# Patient Record
Sex: Female | Born: 1965
Health system: Southern US, Community
[De-identification: ages and names within clinical notes are randomized; demographics above are authoritative.]

## PROBLEM LIST (undated history)

## (undated) ENCOUNTER — Emergency Department (HOSPITAL_BASED_OUTPATIENT_CLINIC_OR_DEPARTMENT_OTHER): Admission: EM | Payer: 59

## (undated) DIAGNOSIS — I951 Orthostatic hypotension: Secondary | ICD-10-CM

## (undated) DIAGNOSIS — N390 Urinary tract infection, site not specified: Secondary | ICD-10-CM

## (undated) DIAGNOSIS — G90A Postural orthostatic tachycardia syndrome (POTS): Secondary | ICD-10-CM

## (undated) DIAGNOSIS — R000 Tachycardia, unspecified: Secondary | ICD-10-CM

## (undated) DIAGNOSIS — B019 Varicella without complication: Secondary | ICD-10-CM

## (undated) DIAGNOSIS — N926 Irregular menstruation, unspecified: Secondary | ICD-10-CM

## (undated) DIAGNOSIS — N12 Tubulo-interstitial nephritis, not specified as acute or chronic: Secondary | ICD-10-CM

## (undated) DIAGNOSIS — N83209 Unspecified ovarian cyst, unspecified side: Secondary | ICD-10-CM

## (undated) DIAGNOSIS — K219 Gastro-esophageal reflux disease without esophagitis: Secondary | ICD-10-CM

## (undated) DIAGNOSIS — M1990 Unspecified osteoarthritis, unspecified site: Secondary | ICD-10-CM

## (undated) DIAGNOSIS — R51 Headache: Secondary | ICD-10-CM

## (undated) DIAGNOSIS — Z889 Allergy status to unspecified drugs, medicaments and biological substances status: Secondary | ICD-10-CM

## (undated) DIAGNOSIS — D649 Anemia, unspecified: Secondary | ICD-10-CM

## (undated) DIAGNOSIS — C4491 Basal cell carcinoma of skin, unspecified: Secondary | ICD-10-CM

## (undated) DIAGNOSIS — I498 Other specified cardiac arrhythmias: Secondary | ICD-10-CM

## (undated) DIAGNOSIS — E079 Disorder of thyroid, unspecified: Secondary | ICD-10-CM

## (undated) HISTORY — PX: CERVICAL CONIZATION W/BX: SHX1330

## (undated) HISTORY — DX: Other specified cardiac arrhythmias: I49.8

## (undated) HISTORY — DX: Anemia, unspecified: D64.9

## (undated) HISTORY — DX: Disorder of thyroid, unspecified: E07.9

## (undated) HISTORY — DX: Basal cell carcinoma of skin, unspecified: C44.91

## (undated) HISTORY — DX: Urinary tract infection, site not specified: N39.0

## (undated) HISTORY — PX: DILATION AND CURETTAGE OF UTERUS: SHX78

## (undated) HISTORY — DX: Unspecified osteoarthritis, unspecified site: M19.90

## (undated) HISTORY — DX: Orthostatic hypotension: I95.1

## (undated) HISTORY — DX: Gastro-esophageal reflux disease without esophagitis: K21.9

## (undated) HISTORY — DX: Allergy status to unspecified drugs, medicaments and biological substances: Z88.9

## (undated) HISTORY — DX: Postural orthostatic tachycardia syndrome (POTS): G90.A

## (undated) HISTORY — DX: Irregular menstruation, unspecified: N92.6

## (undated) HISTORY — DX: Unspecified ovarian cyst, unspecified side: N83.209

## (undated) HISTORY — DX: Tubulo-interstitial nephritis, not specified as acute or chronic: N12

## (undated) HISTORY — DX: Headache: R51

## (undated) HISTORY — DX: Tachycardia, unspecified: R00.0

## (undated) HISTORY — DX: Varicella without complication: B01.9

---

## 2003-07-14 ENCOUNTER — Emergency Department (HOSPITAL_COMMUNITY): Admission: EM | Admit: 2003-07-14 | Discharge: 2003-07-15 | Payer: Self-pay | Admitting: Nurse Practitioner

## 2004-10-15 ENCOUNTER — Other Ambulatory Visit: Admission: RE | Admit: 2004-10-15 | Discharge: 2004-10-15 | Payer: Self-pay | Admitting: Obstetrics and Gynecology

## 2005-04-21 ENCOUNTER — Ambulatory Visit: Payer: Self-pay | Admitting: Cardiology

## 2008-06-19 ENCOUNTER — Ambulatory Visit: Payer: Self-pay | Admitting: Family Medicine

## 2008-06-19 DIAGNOSIS — R5383 Other fatigue: Secondary | ICD-10-CM

## 2008-06-19 DIAGNOSIS — G43109 Migraine with aura, not intractable, without status migrainosus: Secondary | ICD-10-CM | POA: Insufficient documentation

## 2008-06-19 DIAGNOSIS — R5381 Other malaise: Secondary | ICD-10-CM

## 2008-06-23 ENCOUNTER — Encounter (INDEPENDENT_AMBULATORY_CARE_PROVIDER_SITE_OTHER): Payer: Self-pay | Admitting: *Deleted

## 2008-06-23 LAB — CONVERTED CEMR LAB
ALT: 9 units/L (ref 0–35)
AST: 16 units/L (ref 0–37)
Alkaline Phosphatase: 41 units/L (ref 39–117)
CO2: 27 meq/L (ref 19–32)
Chloride: 108 meq/L (ref 96–112)
Creatinine, Ser: 0.7 mg/dL (ref 0.4–1.2)
Eosinophils Relative: 3.7 % (ref 0.0–5.0)
Folate: 10.9 ng/mL
HCT: 36.8 % (ref 36.0–46.0)
Hemoglobin: 13.1 g/dL (ref 12.0–15.0)
Monocytes Absolute: 0.3 10*3/uL (ref 0.1–1.0)
Monocytes Relative: 6.6 % (ref 3.0–12.0)
Neutro Abs: 3.3 10*3/uL (ref 1.4–7.7)
Platelets: 191 10*3/uL (ref 150–400)
Potassium: 3.9 meq/L (ref 3.5–5.1)
TSH: 4.05 microintl units/mL (ref 0.35–5.50)
Total Bilirubin: 0.8 mg/dL (ref 0.3–1.2)
WBC: 5.3 10*3/uL (ref 4.5–10.5)

## 2008-08-07 ENCOUNTER — Ambulatory Visit: Payer: Self-pay | Admitting: Family Medicine

## 2008-11-07 ENCOUNTER — Ambulatory Visit: Payer: Self-pay | Admitting: Family Medicine

## 2008-11-07 DIAGNOSIS — J069 Acute upper respiratory infection, unspecified: Secondary | ICD-10-CM | POA: Insufficient documentation

## 2008-12-29 ENCOUNTER — Ambulatory Visit: Payer: Self-pay | Admitting: Family Medicine

## 2008-12-29 DIAGNOSIS — J019 Acute sinusitis, unspecified: Secondary | ICD-10-CM

## 2008-12-29 DIAGNOSIS — J309 Allergic rhinitis, unspecified: Secondary | ICD-10-CM | POA: Insufficient documentation

## 2009-12-11 ENCOUNTER — Ambulatory Visit: Payer: Self-pay | Admitting: Family Medicine

## 2010-05-18 NOTE — Assessment & Plan Note (Signed)
Summary: ?SINUS INFECTION/CLE   Vital Signs:  Patient profile:   45 year old female Height:      63 inches Weight:      134.75 pounds BMI:     23.96 Temp:     98 degrees F oral Pulse rate:   84 / minute Pulse rhythm:   regular BP sitting:   106 / 74  (left arm) Cuff size:   regular  Vitals Entered By: Lewanda Rife LPN (December 11, 2009 12:15 PM) CC: ?sinus infection head congested, sorethroat with swollen glands, sinus drainage   History of Present Illness: strep test neg today  has had symptoms since july 4th -- thought it was a cold  got better and then worse  took alka selzer cold coughing up green d/c  LN feel swollen face -- congested / pain  very congested nose  tried theraflu and mucinex and zyrtec fever on and off   no ear pain throat hurts   no n/v/d     Allergies: 1)  ! * Oc  Past History:  Past Surgical History: Last updated: June 29, 2008 cervical conization 87,88 D and C   Family History: Last updated: 29-Jun-2008 biological father died of cancer (? 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 Eyes:  Denies blurring and eye irritation. ENT:  Complains of nasal congestion, postnasal drainage, sinus pressure, and sore throat. CV:  Denies chest pain or discomfort and palpitations. Resp:  Complains of cough; denies sputum productive. GI:  Denies diarrhea,  nausea, and vomiting. Derm:  Denies rash.  Physical Exam  General:  Well-developed,well-nourished,in no acute distress; alert,appropriate and cooperative throughout examination Head:  normocephalic, atraumatic, and no abnormalities observed.  tender over frontal and maxillary sinuses bilat  Eyes:  vision grossly intact, pupils equal, pupils round, and pupils reactive to light.  no conjunctival pallor, injection or icterus  Ears:  R ear normal and L ear normal.   Nose:  nares are injected and congested bilaterally  Mouth:  pharynx pink and moist, no erythema, and no exudates.   Neck:  supple with full rom and no masses or thyromegally, no JVD or carotid bruit  Lungs:  Normal respiratory effort, chest expands symmetrically. Lungs are clear to auscultation, no crackles or wheezes. Heart:  Normal rate and regular rhythm. S1 and S2 normal without gallop, murmur, click, rub or other extra sounds. Skin:  Intact without suspicious lesions or rashes Cervical Nodes:  No lymphadenopathy noted Psych:  normal affect, talkative and pleasant    Impression & Recommendations:  Problem # 1:  SINUSITIS - ACUTE-NOS (ICD-461.9) Assessment New  with sinus pain and fever s/p uri will tx with augmentin and update  pt advised to update me if symptoms worsen or do not improve - esp if sinus pain or worse cough  also re start flonase for all season Her updated medication list for  this problem includes:    Fluticasone Propionate 50 Mcg/act Susp (Fluticasone propionate) .Marland Kitchen... 2 sprays each nostril once daily    Augmentin 875-125 Mg Tabs (Amoxicillin-pot clavulanate) .Marland Kitchen... 1 by mouth two times a day for 10 days  Orders: Prescription Created Electronically [phone]) Rapid Strep (60454)  Complete Medication List: 1)  Imitrex 50 Mg Tabs (Sumatriptan succinate) .Marland Kitchen.. 1 by mouth times one as needed migraine (max one pill in a day) 2)  Vitamin B Complex-c Caps (B complex-c) .... Take 1 tablet by mouth once a day 3)   Multivitamins Tabs (Multiple vitamin) .... Take 1 tablet by mouth once a day 4)  Fluticasone Propionate 50 Mcg/act Susp (Fluticasone propionate) .... 2 sprays each nostril once daily 5)  Ginkoba 40 Mg Tabs (Ginkgo biloba) .... Take 1 tablet by mouth once a day 6)  Melatonin 5 Mg Tabs (Melatonin) .... Take 1 tablet by mouth once a day at bedtime 7)  Tylenol Extra Strength 500 Mg Tabs (Acetaminophen) .... Otc as directed. 8)  Augmentin 875-125 Mg Tabs (Amoxicillin-pot clavulanate) .Marland Kitchen.. 1 by mouth two times a day for 10 days  Patient Instructions: 1)  you can try mucinex over the counter twice daily as directed and nasal saline spray for congestion 2)  tylenol over the counter as directed may help with aches, headache and fever 3)  call if symptoms worsen or if not improved in 4-5 days  4)  take the augmentin as directed 5)  get back on flonase during allergy season  Prescriptions: FLUTICASONE PROPIONATE 50 MCG/ACT  SUSP (FLUTICASONE PROPIONATE) 2 sprays each nostril once daily  #13mdi x 11   Entered and Authorized by:   Judith Part MD   Signed by:   Judith Part MD on 12/11/2009   Method used:   [facility] to        [id number]       [address]       [place], [place]  [address]       Ph: [phone] or [phone]       Fax: [phone]   RxID:   [id number] AUGMENTIN 875-125 MG TABS (AMOXICILLIN-POT CLAVULANATE) 1 by mouth two times a day for 10 days  #20 x 0   Entered and Authorized by:   Judith Part MD   Signed by:   Judith Part MD on 12/11/2009   Method used:   [facility]        [id number]       [address]       [place], [place]  [address]       Ph: [phone] or [phone]       Fax: [phone]   RxID:   [id number]   Current Allergies (reviewed today): ! * OC  Laboratory Results  Date/Time Received: December 11, 2009 12:17 PM  Date/Time Reported:  December 11, 2009 12:17 PM   Other Tests  Rapid Strep: negative  Kit Test Internal QC: Positive   (Normal Range: Negative)    Past Medical History:    Chicken Pox- past     Anemia- in past     Frequent Headaches    Migraines    Allergies    Hx of Uti's    ovarian cysts - come and go     irregular menses     basal cell- tx  with Moh's proceedure             [facility]    gyn - Dr Henderson Cloud

## 2010-09-02 ENCOUNTER — Encounter: Payer: Self-pay | Admitting: Family Medicine

## 2010-09-03 ENCOUNTER — Ambulatory Visit (INDEPENDENT_AMBULATORY_CARE_PROVIDER_SITE_OTHER): Payer: 59 | Admitting: Family Medicine

## 2010-09-03 ENCOUNTER — Encounter: Payer: Self-pay | Admitting: Family Medicine

## 2010-09-03 DIAGNOSIS — R5383 Other fatigue: Secondary | ICD-10-CM | POA: Insufficient documentation

## 2010-09-03 DIAGNOSIS — F341 Dysthymic disorder: Secondary | ICD-10-CM

## 2010-09-03 DIAGNOSIS — G43109 Migraine with aura, not intractable, without status migrainosus: Secondary | ICD-10-CM

## 2010-09-03 LAB — COMPREHENSIVE METABOLIC PANEL
AST: 14 U/L (ref 0–37)
Albumin: 3.6 g/dL (ref 3.5–5.2)
BUN: 18 mg/dL (ref 6–23)
Calcium: 9.1 mg/dL (ref 8.4–10.5)
Chloride: 108 mEq/L (ref 96–112)
Glucose, Bld: 79 mg/dL (ref 70–99)
Potassium: 4.7 mEq/L (ref 3.5–5.1)
Total Protein: 6.7 g/dL (ref 6.0–8.3)

## 2010-09-03 LAB — CBC WITH DIFFERENTIAL/PLATELET
Basophils Relative: 0.5 % (ref 0.0–3.0)
Eosinophils Relative: 3.5 % (ref 0.0–5.0)
Lymphocytes Relative: 23.5 % (ref 12.0–46.0)
Neutrophils Relative %: 67.5 % (ref 43.0–77.0)
RBC: 4.09 Mil/uL (ref 3.87–5.11)
WBC: 6.5 10*3/uL (ref 4.5–10.5)

## 2010-09-03 LAB — TSH: TSH: 4.28 u[IU]/mL (ref 0.35–5.50)

## 2010-09-03 MED ORDER — AMITRIPTYLINE HCL 10 MG PO TABS: ORAL_TABLET | ORAL | Status: DC

## 2010-09-03 MED ORDER — SUMATRIPTAN SUCCINATE 50 MG PO TABS: 50.0000 mg | ORAL_TABLET | Freq: Every day | ORAL | Status: DC | PRN

## 2010-09-03 NOTE — Progress Notes (Signed)
Subjective:    Patient ID: Kara Cooper, female    DOB: 12-09-1965, 45 y.o.   MRN: [id number]  HPI  Here for acute visit - headache and hoarseness Has a hx of migraines  imitrex - takes if she catches it early   In past situational stress and saw a therapist with family  Dx with skin cancer moh's procedure - on face - that was stressful   Headaches are worse again  ? If migraine  Always starts on her L side of face - maxillary , then gets the sensation of throat closing up  In bed for several days then  Much more stress with life/ work/ school  Very fatigued  Worried about thyroid  Is susceptible to dehydration  Makes herself work out  [employer] up tired   Is on OC loestrin to control bleeding from fibroid  Has been on it for 4 mo  3 of the 4 were ok   This month is the problem  Getting headaches premenstrual and about every 2 weeks   More down emotionally now - last year related to school  Lots more stress this year Daughter has DM1 and other daughter with financial problems  Not dealing as well with stress  Husband has noticed changes  Complains more   Wt is up 5 lb   Past Medical History  Diagnosis Date  . Chicken pox     in past  . Anemia     in past  . Headache     frequent  . Migraine   . History of seasonal allergies   . UTI (lower urinary tract infection)     history of  . Other and unspecified ovarian cysts     come and go  . Irregular menses   . Basal cell carcinoma     treated with Moh's procedure    History   Social History  . Marital Status: Married    Spouse Name: N/A    Number of Children: 2  . Years of Education: N/A   Occupational History  . RN    Social History Main Topics  . Smoking status: Never Smoker   . Smokeless tobacco: Not on file  . Alcohol Use: Yes     Occasional  . Drug Use: Not on file  . Sexually Active: Not on file   Other Topics Concern  . Not on file   Social History Narrative   Student.  Exercises every  day, also cycles.    Past Surgical History  Procedure Date  . Cervical conization w/bx 1987, 1988  . Dilation and curettage of uterus    No Known Allergies    Review of Systems Review of Systems  Constitutional: Negative for fever, appetite change, fatigue and unexpected weight change.  Eyes: Negative for pain and visual disturbance.  Respiratory: Negative for cough and shortness of breath.   Cardiovascular: Negative.for cp or edema or sob   Gastrointestinal: Negative for nausea, diarrhea and constipation.  Genitourinary: Negative for urgency and frequency.  Skin: Negative for pallor.  Neurological: Negative for weakness, light-headedness, numbness and pos for headaches  Hematological: Negative for adenopathy. Does not bruise/bleed easily.  Psychiatric/Behavioral: poss for dysphoric mooddThe patient is not nervous/anxious.  no SI        Objective:   Physical Exam  Constitutional: She appears well-developed and well-nourished. No distress.  HENT:  Head: Normocephalic and atraumatic.  Right Ear: External ear normal.  Left Ear: External ear normal.  Nose:  Nose normal.  Mouth/Throat: Oropharynx is clear and moist.       No sinus tenderness   Eyes: Conjunctivae and EOM are normal. Pupils are equal, round, and reactive to light.       Fundi grossly wnl   Neck: Normal range of motion. Neck supple. No JVD present. Carotid bruit is not present. No tracheal deviation present. No thyromegaly present.  Cardiovascular: Normal rate, regular rhythm and normal heart sounds.   Pulmonary/Chest: Effort normal and breath sounds normal. No respiratory distress. She has no wheezes.  Abdominal: Soft. Bowel sounds are normal. She exhibits no distension. There is no tenderness.  Musculoskeletal: She exhibits edema. She exhibits no tenderness.  Lymphadenopathy:    She has no cervical adenopathy.  Neurological: She is alert. She has normal strength and normal reflexes. No cranial nerve deficit or  sensory deficit. She displays a negative Romberg sign. Coordination and gait normal.       No TA tenderness  Skin: Skin is warm and dry. No rash noted. No erythema. No pallor.  Psychiatric: She has a normal mood and affect. Her behavior is normal.       Seems fatigued  Good eye contact and comm skills          Assessment & Plan:

## 2010-09-03 NOTE — Assessment & Plan Note (Signed)
Suspect multifactorial - (see disc of migraine) Lab today Trial of elavil for better sleep F/u 2-4 weeks

## 2010-09-03 NOTE — Patient Instructions (Signed)
Elavil (amitriptyline) - start with 1 pill at bedtime for a week If not too sedated - go up to 2 pills at bedtime  Get regular sleep Avoid caffiene Avoid dehydration Labs today Follow up in 2-4 weeks

## 2010-09-03 NOTE — Assessment & Plan Note (Signed)
In addn to sleep problems/ migraine and stress Disc coping skills- overall good  Trial of low dose elavil for this as well as ha and sleep F/u 2-4 wk Disc poss side eff Update asap if worse or any SI

## 2010-09-03 NOTE — Assessment & Plan Note (Signed)
Multifactorial worsening migraines Lack of sleep, inc in stress, dehydration, hormones may all play a role Pt will ask gyn about poss hormone suppl during the cycle Disc sleep- trial of elavil for this and mood and migraine prophylaxis Disc red flags/ poss side eff  F/u 2-4 wk

## 2010-09-08 ENCOUNTER — Ambulatory Visit: Payer: Self-pay | Admitting: Family Medicine

## 2010-10-01 ENCOUNTER — Ambulatory Visit (INDEPENDENT_AMBULATORY_CARE_PROVIDER_SITE_OTHER): Payer: 59 | Admitting: Family Medicine

## 2010-10-01 ENCOUNTER — Encounter: Payer: Self-pay | Admitting: Family Medicine

## 2010-10-01 VITALS — BP 110/68 | HR 76 | Temp 98.2°F | Ht 63.0 in | Wt 138.8 lb

## 2010-10-01 DIAGNOSIS — F341 Dysthymic disorder: Secondary | ICD-10-CM

## 2010-10-01 DIAGNOSIS — G43109 Migraine with aura, not intractable, without status migrainosus: Secondary | ICD-10-CM

## 2010-10-01 MED ORDER — AMITRIPTYLINE HCL 10 MG PO TABS: 10.0000 mg | ORAL_TABLET | Freq: Every day | ORAL | Status: DC

## 2010-10-01 NOTE — Progress Notes (Signed)
Subjective:    Patient ID: Kara Cooper, female    DOB: 02/05/1966, 45 y.o.   MRN: [id number]  HPI Put on elavil for headache and also dysthymia Did not tolerate 20 - too sedated  On 10 mg nightly And sleeping much better  Dry mouth is side effect- drinking lots of water and avoiding caff  Has just had one headache since last visit - mid cycle   On loestrin fe   Mood is much better - with better sleep Is back to waking up refreshed again   Patient Active Problem List  Diagnoses  . MIGRAINE, CLASSICAL  . ALLERGIC RHINITIS  . Fatigue  . Dysthymia   Past Medical History  Diagnosis Date  . Chicken pox     in past  . Anemia     in past  . Headache     frequent  . Migraine   . History of seasonal allergies   . UTI (lower urinary tract infection)     history of  . Other and unspecified ovarian cysts     come and go  . Irregular menses   . Basal cell carcinoma     treated with Moh's procedure   Past Surgical History  Procedure Date  . Cervical conization w/bx 1987, 1988  . Dilation and curettage of uterus    History  Substance Use Topics  . Smoking status: Never Smoker   . Smokeless tobacco: Not on file  . Alcohol Use: Yes     Occasional   Family History  Problem Relation Age of Onset  . Hyperlipidemia Mother   . Heart disease Mother     A-fib, ablation  . Migraines Daughter   . Diabetes Daughter     type I  . Depression Daughter     bipolar   No Known Allergies Current Outpatient Prescriptions on File Prior to Visit  Medication Sig Dispense Refill  . B Complex-C (B-COMPLEX WITH VITAMIN C) tablet Take 1 tablet by mouth daily.        . Multiple Vitamin (MULTIVITAMIN) tablet Take 1 tablet by mouth daily.        . Norethin Ace-Eth Estrad-FE (LOESTRIN FE 1/20 PO) Take 1 tablet by mouth daily.       . SUMAtriptan (IMITREX) 50 MG tablet Take 1 tablet (50 mg total) by mouth daily as needed for migraine. Take one by mouth times one as needed for migraine- max  one pill in a day.  10 tablet  11  . acetaminophen (TYLENOL) 500 MG tablet Take OTC as directed       . fluticasone (FLONASE) 50 MCG/ACT nasal spray Place 2 sprays into the nose daily as needed.       . Ginkgo Biloba (GINKOBA) 40 MG TABS Take by mouth daily.        . Melatonin 5 MG TABS Take one tablet by mouth once a day at bedtime            Review of Systems Review of Systems  Constitutional: Negative for fever, appetite change, fatigue and unexpected weight change.  Eyes: Negative for pain and visual disturbance.  Respiratory: Negative for cough and shortness of breath.   Cardiovascular: Negative.  for cp or palp  Gastrointestinal: Negative for nausea, diarrhea and constipation.  Genitourinary: Negative for urgency and frequency.  Skin: Negative for pallor.  Neurological: Negative for weakness, light-headedness, numbness , headaches are improved  Hematological: Negative for adenopathy. Does not bruise/bleed easily.  Psychiatric/Behavioral: Negative for  dysphoric mood. The patient is not nervous/anxious.          Objective:   Physical Exam  Constitutional: She appears well-developed and well-nourished. No distress.  HENT:  Head: Normocephalic and atraumatic.  Right Ear: External ear normal.  Left Ear: External ear normal.  Mouth/Throat: Oropharynx is clear and moist.       No sinus tenderness  Eyes: Conjunctivae and EOM are normal. Pupils are equal, round, and reactive to light.  Neck: No JVD present. Erythema present. No thyromegaly present.  Cardiovascular: Normal rate, regular rhythm and normal heart sounds.   Pulmonary/Chest: Effort normal and breath sounds normal.  Musculoskeletal: She exhibits no edema.  Lymphadenopathy:    She has no cervical adenopathy.  Neurological: She is alert. She has normal strength and normal reflexes. No cranial nerve deficit or sensory deficit. Coordination and gait normal.  Skin: Skin is warm and dry. No rash noted. No erythema. No  pallor.  Psychiatric: She has a normal mood and affect.          Assessment & Plan:

## 2010-10-01 NOTE — Assessment & Plan Note (Signed)
Much imp on elavil Also better moods and sleep Will speak to gyn about OC as well

## 2010-10-01 NOTE — Patient Instructions (Signed)
I'm glad you are doing better Keep up the good work with lifestyle change Continue elavil at same dose  Keep avoiding caffiene

## 2010-10-03 NOTE — Assessment & Plan Note (Signed)
Improved to resolved with better sleep and elavil

## 2010-11-17 ENCOUNTER — Ambulatory Visit (INDEPENDENT_AMBULATORY_CARE_PROVIDER_SITE_OTHER): Payer: 59 | Admitting: Family Medicine

## 2010-11-17 ENCOUNTER — Encounter: Payer: Self-pay | Admitting: Family Medicine

## 2010-11-17 VITALS — BP 110/68 | HR 68 | Temp 98.3°F | Ht 62.25 in | Wt 138.5 lb

## 2010-11-17 DIAGNOSIS — R490 Dysphonia: Secondary | ICD-10-CM

## 2010-11-17 DIAGNOSIS — Z0000 Encounter for general adult medical examination without abnormal findings: Secondary | ICD-10-CM

## 2010-11-17 LAB — LIPID PANEL
Cholesterol: 136 mg/dL (ref 0–200)
LDL Cholesterol: 74 mg/dL (ref 0–99)

## 2010-11-17 MED ORDER — RANITIDINE HCL 150 MG PO TABS: 150.0000 mg | ORAL_TABLET | Freq: Two times a day (BID) | ORAL | Status: DC

## 2010-11-17 NOTE — Assessment & Plan Note (Signed)
Reviewed health habits including diet and exercise and skin cancer prevention Also reviewed health mt list, fam hx and immunizations  Rev labs from May  Lipids today

## 2010-11-17 NOTE — Progress Notes (Signed)
Subjective:    Patient ID: Kara Cooper, female    DOB: December 11, 1965, 45 y.o.   MRN: [id number]  HPI Here for wellness exam and to review chronic med list  Gyn exam- 1 year ago  Pap-- nl 1 year ago  Mammogram- 1 year ago was normal   Derm exam-- that is due - got a letter in the mail - was seeing Dr Danella Deis , plans to switch to Dr Yetta Barre  She wants to make her own appt   Wt stable bmi of 25  Nl labs in may but has not had chol checked   Headaches were doing the same Is ok with 10 of amitriptyline  2 more breakthrough headaches -- and also peroids  Has not talked to gyn yet about OC  Is not interested in headache clinic yet   Headaches are a little less intense About twice per month  Is sleeping better   Td status - was within 10 years -- about 4 years ago thru [facility]   Has been hoarse for months  Thinks she may have acid reflux  Wakes up at night - choking at times  No heartburn  Sometimes hurts to swallow certain foods  Does not sing anymore   Patient Active Problem List  Diagnoses  . MIGRAINE, CLASSICAL  . ALLERGIC RHINITIS  . Fatigue  . Dysthymia  . Routine general medical examination at a health care facility  . Hoarseness   Past Medical History  Diagnosis Date  . Chicken pox     in past  . Anemia     in past  . Headache     frequent  . Migraine   . History of seasonal allergies   . UTI (lower urinary tract infection)     history of  . Other and unspecified ovarian cysts     come and go  . Irregular menses   . Basal cell carcinoma     treated with Moh's procedure   Past Surgical History  Procedure Date  . Cervical conization w/bx 1987, 1988  . Dilation and curettage of uterus    History  Substance Use Topics  . Smoking status: Never Smoker   . Smokeless tobacco: Not on file  . Alcohol Use: Yes     Occasional   Family History  Problem Relation Age of Onset  . Hyperlipidemia Mother   . Heart disease Mother     A-fib, ablation  . Migraines  Daughter   . Diabetes Daughter     type I  . Depression Daughter     bipolar   No Known Allergies Current Outpatient Prescriptions on File Prior to Visit  Medication Sig Dispense Refill  . acetaminophen (TYLENOL) 500 MG tablet Take OTC as directed       . amitriptyline (ELAVIL) 10 MG tablet Take 1 tablet (10 mg total) by mouth at bedtime.  90 tablet  3  . B Complex-C (B-COMPLEX WITH VITAMIN C) tablet Take 1 tablet by mouth daily.        . Multiple Vitamin (MULTIVITAMIN) tablet Take 1 tablet by mouth daily.        . SUMAtriptan (IMITREX) 50 MG tablet Take 1 tablet (50 mg total) by mouth daily as needed for migraine. Take one by mouth times one as needed for migraine- max one pill in a day.  10 tablet  11  . fluticasone (FLONASE) 50 MCG/ACT nasal spray Place 2 sprays into the nose daily as needed.       Marland Kitchen  Ginkgo Biloba (GINKOBA) 40 MG TABS Take by mouth daily.        . Melatonin 5 MG TABS Take one tablet by mouth once a day at bedtime       . Norethin Ace-Eth Estrad-FE (LOESTRIN FE 1/20 PO) Take 1 tablet by mouth daily.             Review of Systems Review of Systems  Constitutional: Negative for fever, appetite change, fatigue and unexpected weight change.  Eyes: Negative for pain and visual disturbance.  Respiratory: Negative for cough and shortness of breath.   Cardiovascular: Negative.  for cp or palpitations Gastrointestinal: Negative for nausea, diarrhea and constipation. pos for clearing throat/ trouble swallowing  Genitourinary: Negative for urgency and frequency.  Skin: Negative for pallor. or rash  Neurological: Negative for weakness, light-headedness, numbness and headaches.  Hematological: Negative for adenopathy. Does not bruise/bleed easily.  Psychiatric/Behavioral: Negative for dysphoric mood. The patient is not nervous/anxious.         Objective:   Physical Exam  Constitutional: She appears well-developed and well-nourished. No distress.  HENT:  Head:  Normocephalic and atraumatic.  Right Ear: External ear normal.  Left Ear: External ear normal.  Nose: Nose normal.  Mouth/Throat: Oropharynx is clear and moist.  Eyes: Conjunctivae and EOM are normal. Pupils are equal, round, and reactive to light.  Neck: Normal range of motion. Neck supple. No JVD present. No thyromegaly present.  Cardiovascular: Normal rate, regular rhythm, normal heart sounds and intact distal pulses.   Pulmonary/Chest: Effort normal and breath sounds normal. No respiratory distress. She has no wheezes.  Abdominal: Soft. Bowel sounds are normal. She exhibits no distension and no mass. There is no tenderness.  Musculoskeletal: Normal range of motion. She exhibits no edema and no tenderness.  Lymphadenopathy:    She has no cervical adenopathy.  Neurological: She is alert. She has normal reflexes. Coordination normal.  Skin: Skin is warm and dry. No rash noted. No erythema. No pallor.  Psychiatric: She has a normal mood and affect.          Assessment & Plan:

## 2010-11-17 NOTE — Patient Instructions (Signed)
Take zantac 150 mg twice daily for acid reflux I think this should help the hoarseness and swallowing issues  If not improving let me know  Follow up in 2 months  Lab for cholesterol today Follow up with your gyn as planned  Follow up with dermatology as planned

## 2010-11-17 NOTE — Assessment & Plan Note (Signed)
With throat clearing and some night time symptoms ? Poss GERD Will try on zantac bid  Update if not imp F/u 2 months

## 2010-12-22 ENCOUNTER — Other Ambulatory Visit: Payer: Self-pay

## 2010-12-22 MED ORDER — FLUTICASONE PROPIONATE 50 MCG/ACT NA SUSP: 2.0000 | Freq: Every day | NASAL | Status: DC | PRN

## 2010-12-22 NOTE — Telephone Encounter (Signed)
[facility] [address] faxed refill for Fluticasone prop 50 mcg spray # 16gm.

## 2011-01-03 ENCOUNTER — Encounter: Payer: Self-pay | Admitting: Family Medicine

## 2011-01-03 ENCOUNTER — Ambulatory Visit (INDEPENDENT_AMBULATORY_CARE_PROVIDER_SITE_OTHER): Payer: 59 | Admitting: Cardiovascular Disease

## 2011-01-03 ENCOUNTER — Encounter (INDEPENDENT_AMBULATORY_CARE_PROVIDER_SITE_OTHER): Payer: 59

## 2011-01-03 ENCOUNTER — Encounter: Payer: Self-pay | Admitting: Cardiovascular Disease

## 2011-01-03 ENCOUNTER — Ambulatory Visit (INDEPENDENT_AMBULATORY_CARE_PROVIDER_SITE_OTHER): Payer: 59 | Admitting: Family Medicine

## 2011-01-03 VITALS — BP 122/84 | HR 112 | Temp 98.2°F | Wt 136.0 lb

## 2011-01-03 VITALS — BP 120/80 | HR 108 | Resp 18 | Ht 62.0 in | Wt 135.1 lb

## 2011-01-03 DIAGNOSIS — R55 Syncope and collapse: Secondary | ICD-10-CM

## 2011-01-03 DIAGNOSIS — I472 Ventricular tachycardia: Secondary | ICD-10-CM

## 2011-01-03 LAB — TROPONIN I: Troponin I: <0.3 ng/mL (ref <0.30)

## 2011-01-03 LAB — BASIC METABOLIC PANEL
Calcium: 9.6 mg/dL (ref 8.4–10.5)
GFR: 80.62 mL/min (ref >60.00)
Potassium: 3.7 mEq/L (ref 3.5–5.1)
Sodium: 138 mEq/L (ref 135–145)

## 2011-01-03 LAB — CBC WITH DIFFERENTIAL/PLATELET
Basophils Absolute: 0 10*3/uL (ref 0.0–0.1)
Basophils Relative: 0.6 % (ref 0.0–3.0)
Eosinophils Relative: 1.4 % (ref 0.0–5.0)
HCT: 38.3 % (ref 36.0–46.0)
Hemoglobin: 13.2 g/dL (ref 12.0–15.0)
Lymphocytes Relative: 19.5 % (ref 12.0–46.0)
Monocytes Relative: 5.9 % (ref 3.0–12.0)
Neutro Abs: 4.5 10*3/uL (ref 1.4–7.7)
RBC: 4.05 Mil/uL (ref 3.87–5.11)
RDW: 12.4 % (ref 11.5–14.6)
WBC: 6.2 10*3/uL (ref 4.5–10.5)

## 2011-01-03 LAB — CK TOTAL AND CKMB (NOT AT ARMC)

## 2011-01-03 LAB — TSH: TSH: 3.74 u[IU]/mL (ref 0.35–5.50)

## 2011-01-03 NOTE — Assessment & Plan Note (Signed)
The patient's orthostatic vital signs were reviewed from earlier today. She had an increase in her heart rate from 84 beats per minute supine to 112 beats per minute standing her blood pressure did not change with this. There is a noticeable increase in her heart rate with positional change on my exam. 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 Will check a 2-D echocardiogram and Holter monitor to be complete. We discussed the importance of aggressive fluid intake and salt consumption. Will try this as first-line therapy and then see the patient back in 2 weeks after her studies are completed. If she fails conservative treatment with salt and fluids, will consider initiation of fludrocortisone.

## 2011-01-03 NOTE — Assessment & Plan Note (Addendum)
Along with palpitations, chest tightness, family hx SVT, concern for arrhythmogenic syncope.  Alternatively could be orthostatic syncope although orthostatic vitals negative in office today. Expedited referral to cards for further evaluation, possible holter. EKG today - NSR 88, normal axis, intervals, no hypertrophy.  No acute ST/T changes, poor R wave progression anterior leads.  No old to compare.  Given episodes have been happening since last week, and endorses episodes in last several years as well, doubt acute coronary issue, will check stat cardiac enzymes to help rule out.  Advised of red flags to go to ER. Check blood work to eval for other causes for syncope.

## 2011-01-03 NOTE — Patient Instructions (Addendum)
There was small abnormality on EKG.  I would like to set you up with cardiology evaluation to check on rapid heart beat episodes.  Possible holter monitor. Blood work today to check on other things. Pass by Kara Cooper's office for referral to heart doctor. If chest pain returns, or any more passing out, please go to ER to be evaluated. w will call you at [phone]

## 2011-01-03 NOTE — Patient Instructions (Signed)
Your physician recommends that you schedule a follow-up appointment in: 2 weeks  Your physician has requested that you have an echocardiogram. Echocardiography is a painless test that uses sound waves to create images of your heart. It provides your doctor with information about the size and shape of your heart and how well your heart's chambers and valves are working. This procedure takes approximately one hour. There are no restrictions for this procedure.   Your physician has recommended that you wear a holter monitor. Holter monitors are medical devices that record the heart's electrical activity. Doctors most often use these monitors to diagnose arrhythmias. Arrhythmias are problems with the speed or rhythm of the heartbeat. The monitor is a small, portable device. You can wear one while you do your normal daily activities. This is usually used to diagnose what is causing palpitations/syncope (passing out).   Increase salt and fluid intake.

## 2011-01-03 NOTE — Progress Notes (Signed)
HPI:  This is a 45 year old woman presenting for initial evaluation of chest pain, palpitations, and syncope. The patient awoke 3 nights ago feeling lightheaded. She went into the bathroom to splashed cold water on her face and when she walked back to the bedroom she had a syncopal event. She fell back and hit the back of her head. Her husband was nearby and her loss of consciousness was very short. She continues to complain of symptoms of chest burning, palpitations, and lightheadedness. Her lightheadedness is much worse with standing. She denies exertional chest pain or pressure. She denies any recent illness. She has had similar episodes of palpitations and lightheadedness in the past but none of this severity. She denies dyspnea, orthopnea, or PND.  The patient was seen this morning by her primary care physician, Dr. Sharen Hones. Labs were drawn and the show a normal TSH, normal CBC and metabolic panel, and normal cardiac biomarkers.  Outpatient Encounter Prescriptions as of 01/03/2011  Medication Sig Dispense Refill  . acetaminophen (TYLENOL) 500 MG tablet Take OTC as directed       . amitriptyline (ELAVIL) 10 MG tablet Take 1 tablet (10 mg total) by mouth at bedtime.  90 tablet  3  . B Complex-C (B-COMPLEX WITH VITAMIN C) tablet Take 1 tablet by mouth daily.        . fluticasone (FLONASE) 50 MCG/ACT nasal spray Place 2 sprays into the nose daily as needed.  16 g  0  . Multiple Vitamin (MULTIVITAMIN) tablet Take 1 tablet by mouth daily.        . SUMAtriptan (IMITREX) 50 MG tablet Take 1 tablet (50 mg total) by mouth daily as needed for migraine. Take one by mouth times one as needed for migraine- max one pill in a day.  10 tablet  11  . DISCONTD: ranitidine (ZANTAC) 150 MG tablet Take 1 tablet (150 mg total) by mouth 2 (two) times daily.  60 tablet  5    Review of patient's allergies indicates no known allergies.  Past Medical History  Diagnosis Date  . Chicken pox     in past  . Anemia    in past  . Headache     frequent  . Migraine   . History of seasonal allergies   . UTI (lower urinary tract infection)     history of  . Other and unspecified ovarian cysts     come and go  . Irregular menses   . Basal cell carcinoma     treated with Moh's procedure    Past Surgical History  Procedure Date  . Cervical conization w/bx 1987, 1988  . Dilation and curettage of uterus     History   Social History  . Marital Status: Married    Spouse Name: N/A    Number of Children: 2  . Years of Education: N/A   Occupational History  . RN    Social History Main Topics  . Smoking status: Never Smoker   . Smokeless tobacco: Not on file  . Alcohol Use: Yes     Occasional  . Drug Use: Not on file  . Sexually Active: Not on file   Other Topics Concern  . Not on file   Social History Narrative   Student.  Exercises every day, also cycles.    Family History  Problem Relation Age of Onset  . Hyperlipidemia Mother   . Heart disease Mother     A-fib, ablation  . Migraines Daughter   .  Diabetes Daughter     type I  . Depression Daughter     bipolar    ROS: General: no fevers/chills/night sweats Eyes: no blurry vision, diplopia, or amaurosis ENT: no sore throat or hearing loss Resp: no cough, wheezing, or hemoptysis CV: no edema  GI: no abdominal pain, nausea, vomiting, diarrhea, or constipation GU: no dysuria, frequency, or hematuria Skin: no rash Neuro: no headache, numbness, tingling, or weakness of extremities Musculoskeletal: no joint pain or swelling Heme: no bleeding, DVT, or easy bruising Endo: no polydipsia or polyuria  BP 120/80  Pulse 108  Resp 18  Ht 5\' 2"  (1.575 m)  Wt 135 lb 1.9 oz (61.29 kg)  BMI 24.71 kg/m2  LMP 12/24/2010  PHYSICAL EXAM: Pt is alert and oriented, WD, WN, in no distress. HEENT: normal Neck: JVP normal. Carotid upstrokes normal without bruits. No thyromegaly. Lungs: equal expansion, clear bilaterally CV: Apex is  discrete and nondisplaced, RRR without murmur or gallop in the supine position. With standing the patient becomes tachycardic with a grade 2/6 early systolic murmur at the left sternal border. Abd: soft, NT, +BS, no bruit, no hepatosplenomegaly Back: no CVA tenderness Ext: no C/C/E        Femoral pulses 2+= without bruits        DP/PT pulses intact and = Skin: warm and dry without rash Neuro: CNII-XII intact             Strength intact = bilaterally  EKG:  Normal sinus rhythm 88 beats per minute, nonspecific ST abnormality, cannot exclude age-indeterminate anterior infarct.  ASSESSMENT AND PLAN:

## 2011-01-03 NOTE — Progress Notes (Signed)
  Subjective:    Patient ID: Kara Cooper, female    DOB: 1965/08/01, 45 y.o.   MRN: [id number]  HPI CC: feeling "flustered"  Pleasant 45 yo with h/o migraines and frequent headaches.  Pt is cardiac RN.  Friday morning woke up at 3am and heart was racing, went to bathroom and felt really dizzy described as lightheaded with orthostatic - washed face and then when walking back to bed passed out, hit back of head.  Doesn't remember vision going black.  Out for a few seconds.  Has not felt right since.  Went to nursing symposium on Friday.  Having chest burning sensation left chest as well as feeling lightheaded, lasted all morning.  Felt short of breath as well as nauseated with this.  No skipped beats.  Heart has been racing intermittently since episode Friday morning.  Today feeling some burning/ache in chest as well.  No HA prior.  No fever/chills, coughing, viral sxs recently, abd pain, diarrhea or constipation.  Currently being treated for acid reflux with zantac but no other new medicines.  Had this 3-4 yrs ago, episodes would happen after cycling and attributed to dehydration.  States had normal stress test back then.  Not improving this time.    Mother with h/o SVT, s/p ablation.  Over weekend felt lightheaded with position changes - orthostatic.  Grandmother with CABG at later age.  No other sudden cardiac death, early CAD.  Nonsmoker.  Lab Results  Component Value Date   LDLCALC 74 11/17/2010    Review of Systems Per HPI    Objective:   Physical Exam  Nursing note and vitals reviewed. Constitutional: She is oriented to person, place, and time. She appears well-developed and well-nourished. No distress.  HENT:  Head: Normocephalic and atraumatic.  Mouth/Throat: Oropharynx is clear and moist. No oropharyngeal exudate.  Eyes: Conjunctivae and EOM are normal. Pupils are equal, round, and reactive to light. No scleral icterus.  Neck: Normal range of motion. Neck supple. Carotid bruit  is not present. No thyromegaly present.  Cardiovascular: Normal rate, regular rhythm, normal heart sounds and intact distal pulses.   No murmur heard. Pulmonary/Chest: Effort normal and breath sounds normal. No respiratory distress. She has no wheezes. She has no rales.  Musculoskeletal: She exhibits no edema.  Lymphadenopathy:    She has no cervical adenopathy.  Neurological: She is alert and oriented to person, place, and time. She has normal strength and normal reflexes. No cranial nerve deficit or sensory deficit. She displays a negative Romberg sign. Coordination normal.  Reflex Scores:      Bicep reflexes are 2+ on the right side and 2+ on the left side.      Patellar reflexes are 2+ on the right side and 2+ on the left side.      Nl FTN, normal grip strength bilaterally, no pronator drift. No nystagmus  Skin: Skin is warm and dry. No rash noted.  Psychiatric: She has a normal mood and affect.    SL nitrox1 provided, caused HA and worsened dizziness, afterwards chest discomfort resolved some.      Assessment & Plan:

## 2011-01-10 ENCOUNTER — Ambulatory Visit (HOSPITAL_COMMUNITY): Payer: 59 | Attending: Cardiovascular Disease | Admitting: Radiology

## 2011-01-10 DIAGNOSIS — R55 Syncope and collapse: Secondary | ICD-10-CM | POA: Insufficient documentation

## 2011-01-19 ENCOUNTER — Ambulatory Visit: Payer: 59 | Admitting: Cardiovascular Disease

## 2011-05-19 ENCOUNTER — Encounter: Payer: Self-pay | Admitting: Internal Medicine

## 2011-05-19 ENCOUNTER — Ambulatory Visit (INDEPENDENT_AMBULATORY_CARE_PROVIDER_SITE_OTHER): Payer: 59 | Admitting: Internal Medicine

## 2011-05-19 VITALS — BP 100/64 | HR 108 | Ht 63.0 in | Wt 142.5 lb

## 2011-05-19 DIAGNOSIS — R000 Tachycardia, unspecified: Secondary | ICD-10-CM

## 2011-05-19 DIAGNOSIS — R55 Syncope and collapse: Secondary | ICD-10-CM

## 2011-05-19 DIAGNOSIS — R569 Unspecified convulsions: Secondary | ICD-10-CM

## 2011-05-19 NOTE — Assessment & Plan Note (Signed)
As above  there are modest changes in her baseline heart rate and blood pressure with an elevated heart rate and slight decrease in blood pressure. Also describes a variety of blood pressure changes and heart rate changes at home but these are poorly clarified. Tilt table testing may be helpful in eliminating the aspect of this, but as noted above, I am struck by what appears to be anxiety and agitation that was manifest

## 2011-05-19 NOTE — Progress Notes (Signed)
History and Physical  Patient ID: Kara Cooper MRN: [id number], SOB: Jul 28, 1965 46 y.o. Date of Encounter: 05/19/2011, 4:28 PM  Primary Physician: Roxy Manns, MD, MD Primary Cardiologist: Excell Seltzer Primary Electrophysiologist:  Graciela Husbands  Chief Complaint: spells  History of Present Illness: Kara Cooper is a 46 y.o. female seen at the request of Dr. Excell Seltzer carried a diagnosis of pots.  She initially presented in September for evaluation of chest pain, palpitations, and syncope. She awakened and felt somewhat lightheaded. She was flushed and hot. She went into the bathroom to splashed cold water on her face and when she walked back to the bedroom she had a syncopal event. She fell back and hit the back of her head. Her husband was nearby and her loss of consciousness was very short  She's had problems with exercise tolerance and go back about 4 years. She feel dry her bike about 50 miles a day and most recently has been limited to less than 10. This has been limited by tachycardia palpitations and dyspnea.  Over the last couple years she has been noted to sweat considerably more. She is orthostatic lightheadedness. Evaluation has included an ultrasound and general laboratories including thyroid which were normal  She had a spell while in the office which is characteristic of other spells so be described here. I initially was asking her about her work in the conversation was loose her speech fluid. I asked her then to tell me about her symptoms. There was an immediate change in the affect, her face reflected almost fear, there is ringing of her hands and clutching her chest with her arms in the cell phone, shortness of breath and speech limited to 3 or 4 sentences at a time accompanied by hyperventilation. This abated over 5 or 10 minutes only to recur. According to her husband these kinds of spells have happened in areas other situations.   Past Medical History  Diagnosis Date  . Chicken pox    in past  . Anemia     in past  . Headache     frequent  . Migraine   . History of seasonal allergies   . UTI (lower urinary tract infection)     history of  . Other and unspecified ovarian cysts     come and go  . Irregular menses   . Basal cell carcinoma     treated with Moh's procedure  . Postural orthostatic tachycardia syndrome     Dr. Excell Seltzer     Past Surgical History  Procedure Date  . Cervical conization w/bx 1987, 1988  . Dilation and curettage of uterus       Current Outpatient Prescriptions  Medication Sig Dispense Refill  . acetaminophen (TYLENOL) 500 MG tablet Take OTC as directed       . amitriptyline (ELAVIL) 10 MG tablet Take 1 tablet (10 mg total) by mouth at bedtime.  90 tablet  3  . B Complex-C (B-COMPLEX WITH VITAMIN C) tablet Take 1 tablet by mouth daily.        . fluticasone (FLONASE) 50 MCG/ACT nasal spray Place 2 sprays into the nose daily as needed.  16 g  0  . Multiple Vitamin (MULTIVITAMIN) tablet Take 1 tablet by mouth daily.        . SUMAtriptan (IMITREX) 50 MG tablet Take 1 tablet (50 mg total) by mouth daily as needed for migraine. Take one by mouth times one as needed for migraine- max one pill in a day.  10 tablet  11     Allergies: No Known Allergies   History  Substance Use Topics  . Smoking status: Never Smoker   . Smokeless tobacco: Not on file  . Alcohol Use: Yes     Occasional      Family History  Problem Relation Age of Onset  . Hyperlipidemia Mother   . Heart disease Mother     A-fib, ablation  . Migraines Daughter   . Diabetes Daughter     type I  . Depression Daughter     bipolar      ROS:  Please see the history of present illness.     All other systems reviewed and negative.   Vital Signs: Blood pressure 100/64, pulse 108, height 5\' 3"  (1.6 m), weight 142 lb 8 oz (64.638 kg).  PHYSICAL EXAM: General:  Well nourished, well developed female in no acute distress HEENT: normal Lymph: no adenopathy Neck: no  JVD Endocrine:  No thryomegaly Vascular: No carotid bruits; FA pulses 2+ bilaterally without bruits Cardiac:  normal S1, S2; RRR; no murmur Back: without kyphosis/scoliosis, no CVA tenderness Lungs:  clear to auscultation bilaterally, no wheezing, rhonchi or rales Abd: soft, nontender, no hepatomegaly Ext: no edema Musculoskeletal:  No deformities, BUE and BLE strength normal and equal Skin: warm and dry Neuro:  CNs 2-12 intact, no focal abnormalities noted Psych:  Abnormal as described at the end of the history of present illness   EKG:  Sinus at 86 Intervals 0.15/0.07/0.38 Axis is 84 ECG otherwise normal  Labs:   Lab Results  Component Value Date   WBC 6.2 01/03/2011   HGB 13.2 01/03/2011   HCT 38.3 01/03/2011   MCV 94.5 01/03/2011   PLT 195.0 01/03/2011   No results found for this basename: NA,K,CL,CO2,BUN,CREATININE,CALCIUM,LABALBU,PROT,BILITOT,ALKPHOS,ALT,AST,GLUCOSE in the last 168 hours No results found for this basename: CKTOTAL:4,CKMB:4,TROPONINI:4 in the last 72 hours Lab Results  Component Value Date   CHOL 136 11/17/2010   HDL 41.70 11/17/2010   LDLCALC 74 11/17/2010   TRIG 100.0 11/17/2010   No results found for this basename: DDIMER   BNP No results found for this basename: probnp       ASSESSMENT AND PLAN:     Duke Salvia, MD  05/19/2011, 4:28 PM

## 2011-05-19 NOTE — Assessment & Plan Note (Signed)
This young lady presents with a diagnosis of pots. However, when I began to ask her about her symptoms she seemed to manifest more of anxiety and fear that I have ever seen. There was hyperventilation short bursts of sentences and a facade almost of terror.  I told her this was not a syndrome of dysautonomia that I recognized.  I raised the possibility that there may be systemic catecholamine excess and we will do a urine evaluation to rule out pheochromocytoma. I also suggested that this could be an anxiety stress disorder and that she should consider counseling/psychiatric evaluation to least address this possibility

## 2011-05-19 NOTE — Patient Instructions (Signed)
Your physician recommends that you have lab work : 24 hour urine for metanephrines.

## 2011-05-23 ENCOUNTER — Other Ambulatory Visit: Payer: Self-pay | Admitting: Internal Medicine

## 2011-05-30 LAB — METANEPHRINES, URINE, 24 HOUR: Normetanephrine, 24H Ur: 142 mcg/24 h (ref 88–649)

## 2011-06-09 ENCOUNTER — Telehealth: Payer: Self-pay | Admitting: Internal Medicine

## 2011-06-09 NOTE — Telephone Encounter (Signed)
Pt notified of lab results

## 2011-06-09 NOTE — Telephone Encounter (Signed)
FU Call: Pt returning call from our office. Please return call to discuss further.

## 2011-07-23 ENCOUNTER — Other Ambulatory Visit: Payer: Self-pay | Admitting: Family Medicine

## 2011-07-25 NOTE — Telephone Encounter (Signed)
Will refill electronically  

## 2012-12-19 ENCOUNTER — Ambulatory Visit (INDEPENDENT_AMBULATORY_CARE_PROVIDER_SITE_OTHER): Payer: 59 | Admitting: Family Medicine

## 2012-12-19 ENCOUNTER — Encounter: Payer: Self-pay | Admitting: Family Medicine

## 2012-12-19 VITALS — BP 112/74 | HR 62 | Temp 98.3°F | Ht 62.5 in | Wt 140.0 lb

## 2012-12-19 DIAGNOSIS — Z0000 Encounter for general adult medical examination without abnormal findings: Secondary | ICD-10-CM | POA: Insufficient documentation

## 2012-12-19 DIAGNOSIS — G43109 Migraine with aura, not intractable, without status migrainosus: Secondary | ICD-10-CM

## 2012-12-19 LAB — CBC WITH DIFFERENTIAL/PLATELET
Basophils Relative: 1 % (ref 0.0–3.0)
Eosinophils Absolute: 0.2 10*3/uL (ref 0.0–0.7)
Hemoglobin: 13.5 g/dL (ref 12.0–15.0)
Lymphocytes Relative: 30.3 % (ref 12.0–46.0)
MCHC: 34.2 g/dL (ref 30.0–36.0)
MCV: 92 fl (ref 78.0–100.0)
Neutro Abs: 3.1 10*3/uL (ref 1.4–7.7)
RBC: 4.3 Mil/uL (ref 3.87–5.11)

## 2012-12-19 LAB — COMPREHENSIVE METABOLIC PANEL
AST: 13 U/L (ref 0–37)
BUN: 14 mg/dL (ref 6–23)
Calcium: 9 mg/dL (ref 8.4–10.5)
Chloride: 107 mEq/L (ref 96–112)
Creatinine, Ser: 0.8 mg/dL (ref 0.4–1.2)
GFR: 83.42 mL/min (ref >60.00)
Glucose, Bld: 91 mg/dL (ref 70–99)

## 2012-12-19 LAB — LIPID PANEL
Cholesterol: 162 mg/dL (ref 0–200)
LDL Cholesterol: 105 mg/dL — ABNORMAL HIGH (ref 0–99)
Triglycerides: 66 mg/dL (ref 0.0–149.0)

## 2012-12-19 LAB — TSH: TSH: 5.4 u[IU]/mL (ref 0.35–5.50)

## 2012-12-19 MED ORDER — SUMATRIPTAN SUCCINATE 50 MG PO TABS: 50.0000 mg | ORAL_TABLET | Freq: Every day | ORAL | Status: DC | PRN

## 2012-12-19 NOTE — Progress Notes (Signed)
Subjective:    Patient ID: Kara Cooper, female    DOB: 17-Nov-1965, 47 y.o.   MRN: [id number]  HPI Here for health maintenance exam and to review chronic medical problems    Is doing well overall   Saw GYN yesterday- Dr Renaldo Fiddler - uterine fibroid Will start a progesterone only OC soon - menses are bad  Is avoiding estrogen due to migraines   Still has pre menstrual migraines   Ran out of imitrex   Wt is down 2 lb with bmi of 25   Pap smear last was last mo  [facility] upcoming - she will send [place] a note as to when she gets it  Will also get flu shot as well   Mammogram last was last mo as well  Was fine  Has very dense breasts    Need to do wellness labs today   Eating very healthy diet   Gets lot of exercise    Patient Active Problem List   Diagnosis Date Noted  . Routine general medical examination at a health care facility 12/19/2012  . Spells 05/19/2011  . Syncope/dysautomia 01/03/2011  . Hoarseness 11/17/2010  . Fatigue 09/03/2010  . ALLERGIC RHINITIS 12/29/2008  . MIGRAINE, CLASSICAL 06/19/2008   Past Medical History  Diagnosis Date  . Chicken pox     in past  . Anemia     in past  . Headache(784.0)     frequent  . Migraine   . History of seasonal allergies   . UTI (lower urinary tract infection)     history of  . Other and unspecified ovarian cysts     come and go  . Irregular menses   . Basal cell carcinoma     treated with Moh's procedure  . Postural orthostatic tachycardia syndrome     Dr. Excell Seltzer   Past Surgical History  Procedure Laterality Date  . Cervical conization w/bx  1987, 1988  . Dilation and curettage of uterus     History  Substance Use Topics  . Smoking status: Never Smoker   . Smokeless tobacco: Not on file  . Alcohol Use: Yes     Comment: Occasional   Family History  Problem Relation Age of Onset  . Hyperlipidemia Mother   . Heart disease Mother     A-fib, ablation  . Migraines Daughter   .  Diabetes Daughter     type I  . Depression Daughter     bipolar   No Known Allergies Current Outpatient Prescriptions on File Prior to Visit  Medication Sig Dispense Refill  . acetaminophen (TYLENOL) 500 MG tablet Take OTC as directed       . fluticasone (FLONASE) 50 MCG/ACT nasal spray USE 2 SPRAYS IN EACH NOSTRIL DAILY AS NEEDED  16 g  11  . Multiple Vitamin (MULTIVITAMIN) tablet Take 1 tablet by mouth daily.        . SUMAtriptan (IMITREX) 50 MG tablet Take 1 tablet (50 mg total) by mouth daily as needed for migraine. Take one by mouth times one as needed for migraine- max one pill in a day.  10 tablet  11   No current facility-administered medications on file prior to visit.    Review of Systems    Review of Systems  Constitutional: Negative for fever, appetite change, fatigue and unexpected weight change.  Eyes: Negative for pain and visual disturbance.  Respiratory: Negative for cough and shortness of breath.   Cardiovascular:  Negative for cp or palpitations    Gastrointestinal: Negative for nausea, diarrhea and constipation.  Genitourinary: Negative for urgency and frequency.  Skin: Negative for pallor or rash   Neurological: Negative for weakness, light-headedness, numbness and headaches.  Hematological: Negative for adenopathy. Does not bruise/bleed easily.  Psychiatric/Behavioral: Negative for dysphoric mood. The patient is not nervous/anxious.      Objective:   Physical Exam  Constitutional: She appears well-developed and well-nourished. No distress.  HENT:  Head: Normocephalic and atraumatic.  Right Ear: External ear normal.  Left Ear: External ear normal.  Nose: Nose normal.  Mouth/Throat: Oropharynx is clear and moist. No oropharyngeal exudate.  Eyes: Conjunctivae and EOM are normal. Pupils are equal, round, and reactive to light. Right eye exhibits no discharge. Left eye exhibits no discharge. No scleral icterus.  Neck: Normal range of motion. Neck supple. No JVD  present. Carotid bruit is not present. No thyromegaly present.  Cardiovascular: Normal rate, regular rhythm, normal heart sounds and intact distal pulses.  Exam reveals no gallop.   Pulmonary/Chest: Effort normal and breath sounds normal. No respiratory distress. She has no wheezes. She exhibits no tenderness.  Abdominal: Soft. Bowel sounds are normal. She exhibits no distension, no abdominal bruit and no mass. There is no tenderness.  Musculoskeletal: She exhibits no edema and no tenderness.  Lymphadenopathy:    She has no cervical adenopathy.  Neurological: She is alert. She has normal reflexes. No cranial nerve deficit. She exhibits normal muscle tone. Coordination normal.  Skin: Skin is warm and dry. No rash noted. No erythema. No pallor.  Tanned with some lentigos  Psychiatric: She has a normal mood and affect.          Assessment & Plan:

## 2012-12-19 NOTE — Patient Instructions (Addendum)
Lab today I will fax form when it is done  Keep working on healthy habits Get your Tdap and your flu shot as planned

## 2012-12-20 ENCOUNTER — Encounter: Payer: Self-pay | Admitting: *Deleted

## 2012-12-20 NOTE — Assessment & Plan Note (Signed)
Refilled imitrex  Overall stable  Lifestyle changes incl sleep habit hygeine and good water intake

## 2012-12-20 NOTE — Assessment & Plan Note (Signed)
Reviewed health habits including diet and exercise and skin cancer prevention Also reviewed health mt list, fam hx and immunizations  Wellness labs ordered Good health habits Will fill out form for employer

## 2012-12-24 LAB — COLOGUARD: COLOGUARD: NEGATIVE

## 2013-09-27 ENCOUNTER — Encounter: Payer: Self-pay | Admitting: Family Medicine

## 2013-09-27 ENCOUNTER — Ambulatory Visit (INDEPENDENT_AMBULATORY_CARE_PROVIDER_SITE_OTHER): Payer: 59 | Admitting: Family Medicine

## 2013-09-27 VITALS — BP 104/64 | HR 78 | Temp 98.4°F | Ht 62.5 in | Wt 138.2 lb

## 2013-09-27 DIAGNOSIS — J069 Acute upper respiratory infection, unspecified: Secondary | ICD-10-CM

## 2013-09-27 DIAGNOSIS — F430 Acute stress reaction: Secondary | ICD-10-CM

## 2013-09-27 DIAGNOSIS — B9789 Other viral agents as the cause of diseases classified elsewhere: Secondary | ICD-10-CM

## 2013-09-27 MED ORDER — ALPRAZOLAM 0.5 MG PO TABS: 0.5000 mg | ORAL_TABLET | Freq: Every day | ORAL | Status: DC | PRN

## 2013-09-27 MED ORDER — HYDROCODONE-HOMATROPINE 5-1.5 MG/5ML PO SYRP: 5.0000 mL | ORAL_SOLUTION | Freq: Three times a day (TID) | ORAL | Status: DC | PRN

## 2013-09-27 NOTE — Patient Instructions (Signed)
For upper respiratory infection - try hycodan for cough (caution of sedation)- drink lots of fluids Ibuprofen for any pain or fever  Update if not starting to improve in a week or if worsening   Here is a px for xanax for emergent moments of anxiety - use with caution  We will refer you for counseling at check out

## 2013-09-27 NOTE — Progress Notes (Signed)
Pre visit review using our clinic review tool, if applicable. No additional management support is needed unless otherwise documented below in the visit note. 

## 2013-09-27 NOTE — Progress Notes (Signed)
Subjective:    Patient ID: Kara Cooper, female    DOB: 1966-03-14, 48 y.o.   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)   She tries to work out-summer break is here now (just one elective 5 weeks) Has time to take a breath right now  Eats very well   she has never done counseling for herself at all   She has felt this way before  Has always been busy- a "driven" person Does take time for herself  Has venting time with her husband  Lot of exercising with her husband  Takes vacations as well  Also time with grandsons- they live close by   Seldom alcohol   Uri symptoms : Tired and migraine last week  Bad congestion yesterday Cough prod last night / tight chest/ sore  LN are swollen  Now she has a ST today No fever - felt feverish  Took ibuprofen yesterday    Patient Active Problem List   Diagnosis Date Noted  . Viral URI with cough 09/27/2013  . Stress reaction 09/27/2013  . Routine general medical examination at a health care facility 12/19/2012  . Spells 05/19/2011  . Syncope/dysautomia 01/03/2011  . ALLERGIC RHINITIS 12/29/2008  . MIGRAINE, CLASSICAL 06/19/2008   Past Medical History  Diagnosis Date  . Chicken pox     in past  . Anemia     in past  . Headache(784.0)     frequent  . Migraine   . History of seasonal allergies   . UTI (lower urinary tract infection)     history of  .  Other and unspecified ovarian cysts     come and go  . Irregular menses   . Basal cell carcinoma     treated with Moh's procedure  . Postural orthostatic tachycardia syndrome     Dr. Burt Knack   Past Surgical History  Procedure Laterality Date  . Cervical conization w/bx  1987, 1988  . Dilation and curettage of uterus     History  Substance Use Topics  . Smoking status: Never Smoker   . Smokeless tobacco: Not on file  . Alcohol Use: Yes     Comment: Occasional   Family History  Problem Relation Age of Onset  . Hyperlipidemia Mother   . Heart disease Mother     A-fib, ablation  . Migraines Daughter   . Diabetes Daughter     type I  . Depression Daughter     bipolar   No Known Allergies Current Outpatient Prescriptions on File Prior to Visit  Medication Sig Dispense Refill  . acetaminophen (TYLENOL) 500 MG tablet Take OTC as directed       . fluticasone (FLONASE) 50 MCG/ACT nasal spray USE 2 SPRAYS IN EACH NOSTRIL DAILY AS NEEDED  16 g  11  . Multiple  Vitamin (MULTIVITAMIN) tablet Take 1 tablet by mouth daily.        . SUMAtriptan (IMITREX) 50 MG tablet Take 1 tablet (50 mg total) by mouth daily as needed for migraine. Take one by mouth times one as needed for migraine- max one pill in a day.  10 tablet  11   No current facility-administered medications on file prior to visit.    Review of Systems Review of Systems  Constitutional: Negative for fever, appetite change,  and unexpected weight change.  Eyes: Negative for pain and visual disturbance.  ENT pos for cong and rhinorrhea and sinus pressure and st  Respiratory: Negative for wheeze and shortness of breath.  pos for bad cough that makes her throat sore  Cardiovascular: Negative for cp or palpitations    Gastrointestinal: Negative for nausea, diarrhea and constipation.  Genitourinary: Negative for urgency and frequency.  Skin: Negative for pallor or rash   Neurological: Negative for weakness, light-headedness,  numbness and headaches.  Hematological: Negative for adenopathy. Does not bruise/bleed easily.  Psychiatric/Behavioral: Negative for dysphoric mood. Pos for anxiety and excessive worry        Objective:   Physical Exam  Constitutional: She appears well-developed and well-nourished. No distress.  HENT:  Head: Normocephalic and atraumatic.  Right Ear: External ear normal.  Left Ear: External ear normal.  Mouth/Throat: Oropharynx is clear and moist. No oropharyngeal exudate.  Nares are injected and congested  No sinus tenderness Clear rhinorrhea Post throat injection  No swelling or exudate   Eyes: Conjunctivae and EOM are normal. Pupils are equal, round, and reactive to light. Right eye exhibits no discharge. Left eye exhibits no discharge. No scleral icterus.  Neck: Normal range of motion. Neck supple. No JVD present. Carotid bruit is not present. No thyromegaly present.  Cardiovascular: Normal rate, regular rhythm and intact distal pulses.   Pulmonary/Chest: Effort normal and breath sounds normal. No respiratory distress. She has no wheezes. She has no rales. She exhibits no tenderness.  Harsh cough  Constant throat clearing from post nasal drip   Lymphadenopathy:    She has no cervical adenopathy.  Neurological: She is alert. She has normal reflexes. She displays no tremor. No cranial nerve deficit. She exhibits normal muscle tone. Coordination normal.  Skin: Skin is warm and dry. No rash noted. No erythema. No pallor.  Psychiatric: Her speech is normal and behavior is normal. Her mood appears anxious. Her affect is not blunt, not labile and not inappropriate. Thought content is not paranoid. She does not exhibit a depressed mood. She expresses no homicidal and no suicidal ideation.  Good insight  Pleasant and talkative Easily disc stressors  Not tearful          Assessment & Plan:   Problem List Items Addressed This Visit     Respiratory   Viral URI with cough     Disc  symptomatic care - see instructions on AVS  Update if not starting to improve in a week or if worsening  Hycodan for cough with caution of sedation       Other   Stress reaction - Primary     Long disc re: stressors and unmanageable schedule  Reviewed stressors/ coping techniques/symptoms/ support sources/ tx options and side effects in detail today  Ref to counseling  Given xanax for use for anxiety prn / emergent situations Confirmed no SI Will follow closely  >25 minutes spent in face to face time with patient, >50% spent in counselling or  coordination of care      Relevant Orders      Ambulatory referral to Psychology

## 2013-09-28 NOTE — Assessment & Plan Note (Signed)
Disc symptomatic care - see instructions on AVS  Update if not starting to improve in a week or if worsening  Hycodan for cough with caution of sedation

## 2013-09-28 NOTE — Assessment & Plan Note (Signed)
Long disc re: stressors and unmanageable schedule  Reviewed stressors/ coping techniques/symptoms/ support sources/ tx options and side effects in detail today  Ref to counseling  Given xanax for use for anxiety prn / emergent situations Confirmed no SI Will follow closely  >25 minutes spent in face to face time with patient, >50% spent in counselling or coordination of care

## 2013-10-08 ENCOUNTER — Ambulatory Visit: Payer: 59 | Admitting: Psychology

## 2014-02-24 ENCOUNTER — Other Ambulatory Visit: Payer: Self-pay | Admitting: Obstetrics and Gynecology

## 2014-02-24 DIAGNOSIS — N6320 Unspecified lump in the left breast, unspecified quadrant: Secondary | ICD-10-CM

## 2014-02-27 ENCOUNTER — Ambulatory Visit
Admission: RE | Admit: 2014-02-27 | Discharge: 2014-02-27 | Disposition: A | Discharge status: Home / Self Care | Payer: 59 | Source: Ambulatory Visit | Attending: Obstetrics and Gynecology | Admitting: Obstetrics and Gynecology

## 2014-02-27 DIAGNOSIS — N6320 Unspecified lump in the left breast, unspecified quadrant: Secondary | ICD-10-CM

## 2014-03-06 ENCOUNTER — Other Ambulatory Visit: Payer: 59

## 2015-06-18 MED FILL — IBUPROFEN 800 MG TABLET: 800 | 10 days supply | Qty: 30 | Fill #0

## 2015-06-18 MED FILL — OXYCODONE/APAP 5-325: 5-325 | 3 days supply | Qty: 20 | Fill #0

## 2015-11-02 ENCOUNTER — Encounter: Payer: Self-pay | Admitting: Family Medicine

## 2016-04-27 DIAGNOSIS — J069 Acute upper respiratory infection, unspecified: Secondary | ICD-10-CM | POA: Diagnosis not present

## 2016-05-02 DIAGNOSIS — J4541 Moderate persistent asthma with (acute) exacerbation: Secondary | ICD-10-CM | POA: Diagnosis not present

## 2016-05-02 DIAGNOSIS — E1065 Type 1 diabetes mellitus with hyperglycemia: Secondary | ICD-10-CM | POA: Diagnosis not present

## 2016-05-02 DIAGNOSIS — R05 Cough: Secondary | ICD-10-CM | POA: Diagnosis not present

## 2016-05-02 DIAGNOSIS — J069 Acute upper respiratory infection, unspecified: Secondary | ICD-10-CM | POA: Diagnosis not present

## 2016-05-12 DIAGNOSIS — H524 Presbyopia: Secondary | ICD-10-CM | POA: Diagnosis not present

## 2016-05-12 DIAGNOSIS — H5203 Hypermetropia, bilateral: Secondary | ICD-10-CM | POA: Diagnosis not present

## 2016-05-12 DIAGNOSIS — H52223 Regular astigmatism, bilateral: Secondary | ICD-10-CM | POA: Diagnosis not present

## 2016-07-01 DIAGNOSIS — Z01419 Encounter for gynecological examination (general) (routine) without abnormal findings: Secondary | ICD-10-CM | POA: Diagnosis not present

## 2016-07-01 DIAGNOSIS — Z6823 Body mass index (BMI) 23.0-23.9, adult: Secondary | ICD-10-CM | POA: Diagnosis not present

## 2016-07-14 DIAGNOSIS — Z13220 Encounter for screening for lipoid disorders: Secondary | ICD-10-CM | POA: Diagnosis not present

## 2016-07-14 DIAGNOSIS — Z1231 Encounter for screening mammogram for malignant neoplasm of breast: Secondary | ICD-10-CM | POA: Diagnosis not present

## 2016-07-14 DIAGNOSIS — Z1321 Encounter for screening for nutritional disorder: Secondary | ICD-10-CM | POA: Diagnosis not present

## 2016-07-14 DIAGNOSIS — Z1329 Encounter for screening for other suspected endocrine disorder: Secondary | ICD-10-CM | POA: Diagnosis not present

## 2016-07-14 DIAGNOSIS — Z131 Encounter for screening for diabetes mellitus: Secondary | ICD-10-CM | POA: Diagnosis not present

## 2016-09-07 DIAGNOSIS — R799 Abnormal finding of blood chemistry, unspecified: Secondary | ICD-10-CM | POA: Diagnosis not present

## 2016-12-13 DIAGNOSIS — Z85828 Personal history of other malignant neoplasm of skin: Secondary | ICD-10-CM | POA: Diagnosis not present

## 2016-12-13 DIAGNOSIS — L570 Actinic keratosis: Secondary | ICD-10-CM | POA: Diagnosis not present

## 2016-12-13 DIAGNOSIS — L814 Other melanin hyperpigmentation: Secondary | ICD-10-CM | POA: Diagnosis not present

## 2016-12-13 DIAGNOSIS — L720 Epidermal cyst: Secondary | ICD-10-CM | POA: Diagnosis not present

## 2017-01-22 ENCOUNTER — Encounter: Payer: Self-pay | Admitting: Emergency Medicine

## 2017-01-22 ENCOUNTER — Emergency Department (INDEPENDENT_AMBULATORY_CARE_PROVIDER_SITE_OTHER)
Admission: EM | Admit: 2017-01-22 | Discharge: 2017-01-22 | Disposition: A | Discharge status: Home / Self Care | Payer: 59 | Source: Home / Self Care | Attending: Family Medicine | Admitting: Family Medicine

## 2017-01-22 ENCOUNTER — Emergency Department (INDEPENDENT_AMBULATORY_CARE_PROVIDER_SITE_OTHER): Payer: 59

## 2017-01-22 DIAGNOSIS — S52042A Displaced fracture of coronoid process of left ulna, initial encounter for closed fracture: Secondary | ICD-10-CM

## 2017-01-22 DIAGNOSIS — S52042S Displaced fracture of coronoid process of left ulna, sequela: Secondary | ICD-10-CM | POA: Diagnosis not present

## 2017-01-22 NOTE — Discharge Instructions (Signed)
°  You may take 500mg acetaminophen every 4-6 hours or in combination with ibuprofen 400-600mg every 6-8 hours as needed for pain and inflammation.  

## 2017-01-22 NOTE — ED Provider Notes (Addendum)
[facility]    CSN: [id number] Arrival date & time: 01/22/17  1114     History   Chief Complaint Chief Complaint  Patient presents with  . Arm Pain    HPI [name] is a 51 y.o. female.   HPI  Kara Cooper is a 51 y.o. female presenting to UC with c/o 1 week of Left arm pain after fall while ice skating.  She reports trying to catch her fall with left arm but it felt like it twisted during the fall.  She has been trying ice and ibuprofen but denies relief. Pain and swelling is worst around her elbow. Decreased ROM due to the pain.  Pain radiates up and down her arm.  No prior fracture or surgery to Left arm. She is Right hand dominant.     Past Medical History:  Diagnosis Date  . Anemia    in past  . Basal cell carcinoma    treated with Moh's procedure  . Chicken pox    in past  . Headache(784.0)    frequent  . History of seasonal allergies   . Irregular menses   . Migraine   . Other and unspecified ovarian cysts    come and go  . Postural orthostatic tachycardia syndrome    Dr. Burt Knack  . UTI (lower urinary tract infection)    history of    Patient Active Problem List   Diagnosis Date Noted  . Viral URI with cough 09/27/2013  . Stress reaction 09/27/2013  . Routine general medical examination at a health care facility 12/19/2012  . Spells 05/19/2011  . Syncope/dysautomia 01/03/2011  . ALLERGIC RHINITIS 12/29/2008  . MIGRAINE, CLASSICAL 06/19/2008    Past Surgical History:  Procedure Laterality Date  . CERVICAL CONIZATION W/BX  1987, 1988  . DILATION AND CURETTAGE OF UTERUS      OB History    No data available       Home Medications    Prior to Admission medications   Medication Sig Start Date End Date Taking? Authorizing Provider  acetaminophen (TYLENOL) 500 MG tablet Take OTC as directed     [provider]  ALPRAZolam (XANAX) 0.5 MG tablet Take 1 tablet (0.5 mg total) by mouth daily as needed for anxiety or sleep.  09/27/13   Tower, Wynelle Fanny, MD  fluticasone (FLONASE) 50 MCG/ACT nasal spray USE 2 SPRAYS IN EACH NOSTRIL DAILY AS NEEDED 07/23/11   Tower, Wynelle Fanny, MD  HYDROcodone-homatropine [place]) 5-1.5 MG/5ML syrup Take 5 mLs by mouth every 8 (eight) hours as needed for cough. 09/27/13   Tower, Wynelle Fanny, MD  Multiple Vitamin (MULTIVITAMIN) tablet Take 1 tablet by mouth daily.      [provider]  SUMAtriptan (IMITREX) 50 MG tablet Take 1 tablet (50 mg total) by mouth daily as needed for migraine. Take one by mouth times one as needed for migraine- max one pill in a day. 12/19/12   Tower, Wynelle Fanny, MD    Family History Family History  Problem Relation Age of Onset  . Hyperlipidemia Mother   . Heart disease Mother        A-fib, ablation  . Migraines Daughter   . Diabetes Daughter        type I  . Depression Daughter        bipolar    Social History Social History  Substance Use Topics  . Smoking status: Never Smoker  . Smokeless tobacco: Never Used  . Alcohol use  Yes     Comment: Occasional     Allergies   Patient has no known allergies.   Review of Systems Review of Systems  Musculoskeletal: Positive for arthralgias, joint swelling and myalgias.       Left arm, mostly at elbow.  Skin: Negative for color change and wound.  Neurological: Positive for weakness. Negative for numbness.     Physical Exam Triage Vital Signs ED Triage Vitals  Enc Vitals Group     BP 01/22/17 1156 105/68     Pulse Rate 01/22/17 1156 60     Resp 01/22/17 1156 15     Temp 01/22/17 1156 98.2 F (36.8 C)     Temp Source 01/22/17 1156 Oral     SpO2 01/22/17 1156 100 %     Weight 01/22/17 1157 128 lb (58.1 kg)     Height 01/22/17 1157 5\' 2"  (1.575 m)     Head Circumference --      Peak Flow --      Pain Score 01/22/17 1158 8     Pain Loc --      Pain Edu? --      Excl. in [place]? --    No data found.   Updated Vital Signs BP 105/68 (BP Location: Right Arm)   Pulse 60   Temp 98.2 F (36.8 C)  (Oral)   Resp 15   Ht 5\' 2"  (1.575 m)   Wt 128 lb (58.1 kg)   LMP 11/22/2016   SpO2 100%   BMI 23.41 kg/m   Visual Acuity Right Eye Distance:   Left Eye Distance:   Bilateral Distance:    Right Eye Near:   Left Eye Near:    Bilateral Near:     Physical Exam  Constitutional: She is oriented to person, place, and time. She appears well-developed and well-nourished.  HENT:  Head: Normocephalic and atraumatic.  Eyes: EOM are normal.  Neck: Normal range of motion.  Cardiovascular: Normal rate.   Pulses:      Radial pulses are 2+ on the left side.  Pulmonary/Chest: Effort normal.  Musculoskeletal: She exhibits edema and tenderness.  Left shoulder: full ROM, non-tender.  Left elbow: Mild to moderate edema to medial aspect elbow, tender. Decreased flexion and extension at elbow. Increased pain with pronation and supination at elbow. Left wrist: full ROM, non-tender.  Neurological: She is alert and oriented to person, place, and time.  Skin: Skin is warm and dry. Capillary refill takes less than 2 seconds.  Left arm: skin in tact. No ecchymosis or erythema.   Psychiatric: She has a normal mood and affect. Her behavior is normal.  Nursing note and vitals reviewed.    UC Treatments / Results  Labs (all labs ordered are listed, but only abnormal results are displayed) Labs Reviewed - No data to display  EKG  EKG Interpretation None       Radiology Dg Elbow Complete Left  Result Date: 01/22/2017 CLINICAL DATA:  51 year old female with left arm pain after falling while ice skating 8 days previously EXAM: LEFT ELBOW - COMPLETE 3+ VIEW COMPARISON:  None. FINDINGS: Subtle elevation anterior humeral fat pad consistent with the presence of a joint effusion. Additionally, there is irregularity of the cortical surface at the coronoid process of the ulna consistent with a small minimally displaced fracture. The humeral head and neck appear intact. The distal humerus is intact.  IMPRESSION: Minimally displaced fracture of the coronoid process of the ulna. Electronically Signed  By: Jacqulynn Cadet M.D.   On: 01/22/2017 12:48    Procedures .Splint Application Date/Time: 85/12/2922 4:54 PM Performed by: Noe Gens Authorized by: Theone Murdoch A   Consent:    Consent obtained:  Verbal   Consent given by:  Patient   Risks discussed:  Discoloration, numbness, pain and swelling   Alternatives discussed:  Delayed treatment Pre-procedure details:    Sensation:  Normal   Skin color:  Pink warm and dry Procedure details:    Laterality:  Left   Location:  Elbow   Elbow:  L elbow   Strapping: no     Cast type:  Long arm   Splint type:  Long arm (posterior)   Supplies:  Elastic bandage, cotton padding and Ortho-Glass Post-procedure details:    Pain:  Improved   Sensation:  Normal   Skin color:  Warm pink and dry   Patient tolerance of procedure:  Tolerated well, no immediate complications   (including critical care time)  Medications Ordered in UC Medications - No data to display   Initial Impression / Assessment and Plan / UC Course  I have reviewed the triage vital signs and the nursing notes.  Pertinent labs & imaging results that were available during my care of the patient were reviewed by me and considered in my medical decision making (see chart for details).     Exam c/w closed minimally displaced fracture of coronoid process of Left ulna Long arm posterior splint applied along with sling for comfort Encouraged to call Sports Medicine tomorrow to schedule f/u appointment this week with Dr. Dianah Field May continue taking acetaminophen and ibuprofen as needed for pain and swelling.    Final Clinical Impressions(s) / UC Diagnoses   Final diagnoses:  Displaced fracture of coronoid process of left ulna, initial encounter for closed fracture    New Prescriptions Discharge Medication List as of 01/22/2017  1:19 PM       Controlled  Substance Prescriptions [employer] Controlled Substance Registry consulted? Not Applicable   Tyrell Antonio 01/22/17 1653    Noe Gens, PA-C 01/22/17 1655

## 2017-01-22 NOTE — ED Triage Notes (Signed)
Patient presents to [facility] with C/O pain in the left arm from the forearm up to the shoulder area 8/10. Fell 1 week ago ice skating and landed on the left arm. Pain has become more constant and Ibuprofen has helped some.

## 2017-01-24 ENCOUNTER — Ambulatory Visit (INDEPENDENT_AMBULATORY_CARE_PROVIDER_SITE_OTHER): Payer: 59 | Admitting: Sports Medicine

## 2017-01-24 ENCOUNTER — Encounter: Payer: Self-pay | Admitting: Sports Medicine

## 2017-01-24 DIAGNOSIS — S52042A Displaced fracture of coronoid process of left ulna, initial encounter for closed fracture: Secondary | ICD-10-CM | POA: Insufficient documentation

## 2017-01-24 DIAGNOSIS — S52045A Nondisplaced fracture of coronoid process of left ulna, initial encounter for closed fracture: Secondary | ICD-10-CM | POA: Diagnosis not present

## 2017-01-24 NOTE — Assessment & Plan Note (Signed)
Continue slab splint.  Return in one week for long arm cast placement  I billed a fracture code for this encounter, all subsequent visits will be post-op checks in the global period.

## 2017-01-24 NOTE — Progress Notes (Signed)
   Subjective:    I'm seeing this patient as a consultation for:  Kara Cha PA-C  CC: Arm fracture  HPI: About a week and a half ago this pleasant 51 year old female was ice skating, she fell onto an outstretched hand, she had immediate pain, swelling, bruising but did not seek medical attention for about a week and a half, she was seen in urgent care where x-rays showed a minimally displaced fracture of the coronoid process of the left ulna. She was appropriately placed in a posterior slab splint and referred to me for further evaluation and definitive treatment, currently pain is minimal, localized over the anterior elbow without radiation.  Past medical history, Surgical history, Family history not pertinant except as noted below, Social history, Allergies, and medications have been entered into the medical record, reviewed, and no changes needed.   Review of Systems: No headache, visual changes, nausea, vomiting, diarrhea, constipation, dizziness, abdominal pain, skin rash, fevers, chills, night sweats, weight loss, swollen lymph nodes, body aches, joint swelling, muscle aches, chest pain, shortness of breath, mood changes, visual or auditory hallucinations.   Objective:   General: Well Developed, well nourished, and in no acute distress.  Neuro:  Extra-ocular muscles intact, able to move all 4 extremities, sensation grossly intact.  Deep tendon reflexes tested were normal. Psych: Alert and oriented, mood congruent with affect. ENT:  Ears and nose appear unremarkable.  Hearing grossly normal. Neck: Unremarkable overall appearance, trachea midline.  No visible thyroid enlargement. Eyes: Conjunctivae and lids appear unremarkable.  Pupils equal and round. Skin: Warm and dry, no rashes noted.  Cardiovascular: Pulses palpable, no extremity edema. Left Elbow: Visibly swollen Good supination and pronation but lacks flexion past 90 and extension past about 10. Strength is full to all of the  above directions Stable to varus, valgus stress. Negative moving valgus stress test. No discrete areas of tenderness to palpation. Ulnar nerve does not sublux. Negative cubital tunnel Tinel's.  Slab splint was replaced.  Impression and Recommendations:   This case required medical decision making of moderate complexity.  Fracture of coronoid process of ulna, left, closed Continue slab splint.  Return in one week for long arm cast placement  I billed a fracture code for this encounter, all subsequent visits will be post-op checks in the global period.  ___________________________________________ Gwen Her. Dianah Field, M.D., ABFM., CAQSM. Primary Care and [place] Instructor of [place] of [facility] of Medicine

## 2017-01-31 ENCOUNTER — Ambulatory Visit (INDEPENDENT_AMBULATORY_CARE_PROVIDER_SITE_OTHER): Payer: 59 | Admitting: Sports Medicine

## 2017-01-31 ENCOUNTER — Encounter: Payer: Self-pay | Admitting: Sports Medicine

## 2017-01-31 DIAGNOSIS — S52045D Nondisplaced fracture of coronoid process of left ulna, subsequent encounter for closed fracture with routine healing: Secondary | ICD-10-CM | POA: Diagnosis not present

## 2017-01-31 MED ORDER — ALPRAZOLAM 0.5 MG PO TABS: 0.5000 mg | ORAL_TABLET | ORAL | 0 refills | Status: DC | PRN

## 2017-01-31 NOTE — Assessment & Plan Note (Signed)
Long arm cast placed.  Return in 4 weeks. She was having some anxiety and claustrophobia in the cast, I'm going to give her a bit of alprazolam to take for panic.

## 2017-01-31 NOTE — Progress Notes (Signed)
  Subjective: Kara Cooper comes back for follow-up of a left nondisplaced coronoid process fracture of the ulna, she has been doing well and a posterior slab splint.   Objective: General: Well-developed, well-nourished, and in no acute distress. Left arm: Splint is removed, she still does have some tenderness over the coracoid process however there is no swelling.  I applied a long-arm cast.  Assessment/plan:   Fracture of coronoid process of ulna, left, closed Long arm cast placed.  Return in 4 weeks. She was having some anxiety and claustrophobia in the cast, I'm going to give her a bit of alprazolam to take for panic.  ___________________________________________ Gwen Her. Dianah Field, M.D., ABFM., CAQSM. Primary Care and [place] Instructor of [place] of [facility] of Medicine

## 2017-02-01 DIAGNOSIS — L720 Epidermal cyst: Secondary | ICD-10-CM | POA: Diagnosis not present

## 2017-02-01 DIAGNOSIS — L814 Other melanin hyperpigmentation: Secondary | ICD-10-CM | POA: Diagnosis not present

## 2017-02-01 DIAGNOSIS — L738 Other specified follicular disorders: Secondary | ICD-10-CM | POA: Diagnosis not present

## 2017-02-01 DIAGNOSIS — Z85828 Personal history of other malignant neoplasm of skin: Secondary | ICD-10-CM | POA: Diagnosis not present

## 2017-02-01 DIAGNOSIS — D225 Melanocytic nevi of trunk: Secondary | ICD-10-CM | POA: Diagnosis not present

## 2017-02-01 DIAGNOSIS — L821 Other seborrheic keratosis: Secondary | ICD-10-CM | POA: Diagnosis not present

## 2017-02-28 ENCOUNTER — Encounter: Payer: Self-pay | Admitting: Sports Medicine

## 2017-02-28 ENCOUNTER — Ambulatory Visit (INDEPENDENT_AMBULATORY_CARE_PROVIDER_SITE_OTHER): Payer: 59 | Admitting: Sports Medicine

## 2017-02-28 DIAGNOSIS — S52045D Nondisplaced fracture of coronoid process of left ulna, subsequent encounter for closed fracture with routine healing: Secondary | ICD-10-CM

## 2017-02-28 NOTE — Assessment & Plan Note (Signed)
Cast removed as above after 4 weeks, she is 5-6 weeks out of the fracture. She will regain her elbow range of motion slowly, return to see me in 1 month.

## 2017-02-28 NOTE — Progress Notes (Signed)
  Subjective: 5-6 weeks post fracture of the coronoid process of the ulna, here for cast removal, has been in a long-arm cast now for 4 weeks.  Objective: General: Well-developed, well-nourished, and in no acute distress. Left elbow: Cast is removed, some difficulty with range of motion but overall doing okay.  Assessment/plan:   Fracture of coronoid process of ulna, left, closed Cast removed as above after 4 weeks, she is 5-6 weeks out of the fracture. She will regain her elbow range of motion slowly, return to see me in 1 month. ___________________________________________ Gwen Her. Dianah Field, M.D., ABFM., CAQSM. Primary Care and [place] Instructor of [employer] of [facility] of Medicine

## 2017-03-28 ENCOUNTER — Encounter: Payer: Self-pay | Admitting: Sports Medicine

## 2017-03-28 ENCOUNTER — Ambulatory Visit (INDEPENDENT_AMBULATORY_CARE_PROVIDER_SITE_OTHER): Payer: 59 | Admitting: Sports Medicine

## 2017-03-28 ENCOUNTER — Ambulatory Visit (INDEPENDENT_AMBULATORY_CARE_PROVIDER_SITE_OTHER): Payer: 59

## 2017-03-28 DIAGNOSIS — X58XXXD Exposure to other specified factors, subsequent encounter: Secondary | ICD-10-CM

## 2017-03-28 DIAGNOSIS — S52502A Unspecified fracture of the lower end of left radius, initial encounter for closed fracture: Secondary | ICD-10-CM | POA: Diagnosis not present

## 2017-03-28 DIAGNOSIS — S52045K Nondisplaced fracture of coronoid process of left ulna, subsequent encounter for closed fracture with nonunion: Secondary | ICD-10-CM

## 2017-03-28 DIAGNOSIS — S52045D Nondisplaced fracture of coronoid process of left ulna, subsequent encounter for closed fracture with routine healing: Secondary | ICD-10-CM

## 2017-03-28 MED ORDER — DICLOFENAC SODIUM 75 MG PO TBEC: 75.0000 mg | DELAYED_RELEASE_TABLET | Freq: Two times a day (BID) | ORAL | 3 refills | Status: DC

## 2017-03-28 MED FILL — DICLOFENAC SODIUM 75 MG TAB: 75 | 30 days supply | Qty: 60 | Fill #0

## 2017-03-28 NOTE — Progress Notes (Signed)
  Subjective:    CC: Left elbow pain  HPI: This is a very pleasant 51 year old female, I saw her about 2 months ago with a nondisplaced left coronoid process fracture of the elbow.  She was in a long-arm cast, when the cast was removed the pain at her coronoid process resolved, she did have some expected stiffness and lack of range of motion.  Unfortunately over the past month that she has only regained some of her range of motion, has continued to have some pain that she localizes at the posterior lateral elbow, inability to fully extend and inability to fully flex the elbow, mild swelling.   Past medical history:  Negative.  See flowsheet/record as well for more information.  Surgical history: Negative.  See flowsheet/record as well for more information.  Family history: Negative.  See flowsheet/record as well for more information.  Social history: Negative.  See flowsheet/record as well for more information.  Allergies, and medications have been entered into the medical record, reviewed, and no changes needed.   (To billers/coders, pertinent past medical, social, surgical, family history can be found in problem list, if problem list is marked as reviewed then this indicates that past medical, social, surgical, family history was also reviewed)  Review of Systems: No fevers, chills, night sweats, weight loss, chest pain, or shortness of breath.   Objective:    General: Well Developed, well nourished, and in no acute distress.  Neuro: Alert and oriented x3, extra-ocular muscles intact, sensation grossly intact.  HEENT: Normocephalic, atraumatic, pupils equal round reactive to light, neck supple, no masses, no lymphadenopathy, thyroid nonpalpable.  Skin: Warm and dry, no rashes. Cardiac: Regular rate and rhythm, no murmurs rubs or gallops, no lower extremity edema.  Respiratory: Clear to auscultation bilaterally. Not using accessory muscles, speaking in full sentences. Left elbow: Mild  visual swelling Pronation and supination is normal, she does have about 2 degrees of extension lag and may be 4 degrees of flexion lag. There is tenderness over the anconeus muscle at the joint line. Strength is full to all of the above directions Stable to varus, valgus stress. Negative moving valgus stress test. Ulnar nerve does not sublux. Negative cubital tunnel Tinel's.  X-rays show a tiny density at the tip of the coronoid process that is likely an ununited fracture, these are typically asymptomatic.  There is a small joint effusion.  Impression and Recommendations:    Fracture of coronoid process of ulna, left, closed Persistent elbow pain 2 months after nondisplaced coronoid process fracture, she does have mechanical obstruction, she does need an MRI, and we are planning an injection versus arthroscopy if intra-articular loose body. Return to see me to go over MRI results, Voltaren for pain in the meantime. X-rays show a tiny density at the tip of the coronoid process that is likely an ununited fracture, these are typically asymptomatic.   There is a small joint effusion, I do think she would get great relief from a joint injection..  ___________________________________________ Gwen Her. Dianah Field, M.D., ABFM., CAQSM. Primary Care and [place] Instructor of [place] of [facility] of Medicine

## 2017-03-28 NOTE — Assessment & Plan Note (Addendum)
Persistent elbow pain 2 months after nondisplaced coronoid process fracture, she does have mechanical obstruction, she does need an MRI, and we are planning an injection versus arthroscopy if intra-articular loose body. Return to see me to go over MRI results, Voltaren for pain in the meantime. X-rays show a tiny density at the tip of the coronoid process that is likely an ununited fracture, these are typically asymptomatic.   There is a small joint effusion, I do think she would get great relief from a joint injection.Marland Kitchen

## 2017-04-01 ENCOUNTER — Ambulatory Visit (HOSPITAL_BASED_OUTPATIENT_CLINIC_OR_DEPARTMENT_OTHER)
Admission: RE | Admit: 2017-04-01 | Discharge: 2017-04-01 | Disposition: A | Discharge status: Home / Self Care | Payer: 59 | Source: Ambulatory Visit | Attending: Sports Medicine | Admitting: Sports Medicine

## 2017-04-01 DIAGNOSIS — S52045D Nondisplaced fracture of coronoid process of left ulna, subsequent encounter for closed fracture with routine healing: Secondary | ICD-10-CM | POA: Insufficient documentation

## 2017-04-01 DIAGNOSIS — M25422 Effusion, left elbow: Secondary | ICD-10-CM | POA: Insufficient documentation

## 2017-04-04 ENCOUNTER — Encounter: Payer: Self-pay | Admitting: Sports Medicine

## 2017-04-04 ENCOUNTER — Ambulatory Visit (INDEPENDENT_AMBULATORY_CARE_PROVIDER_SITE_OTHER): Payer: 59 | Admitting: Sports Medicine

## 2017-04-04 DIAGNOSIS — S52045G Nondisplaced fracture of coronoid process of left ulna, subsequent encounter for closed fracture with delayed healing: Secondary | ICD-10-CM | POA: Diagnosis not present

## 2017-04-04 DIAGNOSIS — N12 Tubulo-interstitial nephritis, not specified as acute or chronic: Secondary | ICD-10-CM

## 2017-04-04 HISTORY — DX: Tubulo-interstitial nephritis, not specified as acute or chronic: N12

## 2017-04-04 LAB — POCT URINALYSIS DIPSTICK
Bilirubin, UA: NEGATIVE
Blood, UA: NEGATIVE
Glucose, UA: NEGATIVE
Ketones, UA: NEGATIVE
Nitrite, UA: NEGATIVE
Protein, UA: NEGATIVE
Spec Grav, UA: <=1.005 — AB (ref 1.010–1.025)
Urobilinogen, UA: 0.2 E.U./dL
pH, UA: 7 (ref 5.0–8.0)

## 2017-04-04 MED ORDER — NITROFURANTOIN MONOHYD MACRO 100 MG PO CAPS: 100.0000 mg | ORAL_CAPSULE | Freq: Two times a day (BID) | ORAL | 0 refills | Status: DC

## 2017-04-04 MED ORDER — FLUCONAZOLE 150 MG PO TABS: 150.0000 mg | ORAL_TABLET | Freq: Once | ORAL | 0 refills | Status: AC

## 2017-04-04 NOTE — Assessment & Plan Note (Signed)
MRI showed partial healing of the coronoid process fracture, there was more an elbow joint effusion. Elbow joint injection under ultrasound guidance today. Return in 1 month.

## 2017-04-04 NOTE — Assessment & Plan Note (Signed)
Starting Macrobid for 10 days, sending urine for culture, we will change antibiotics if needed. Diflucan if needed for yeast infection.

## 2017-04-04 NOTE — Progress Notes (Signed)
  Subjective:    CC: Left elbow pain  HPI: Kara Cooper is a very pleasant 51 year old female, she had a fall with a coronoid process fracture, nondisplaced.  She was in a long-arm cast for a month.  She did well but unfortunately continued to have some swelling, pain in her left elbow at the posterior lateral aspect.  Ultimately we obtained an MRI that showed continued healing/delayed union of the coronoid process fracture, no intra-articular loose bodies, but significant elbow joint effusion and synovitis.  Pain is moderate, persistent, localized without radiation, no mechanical symptoms.  Dysuria: Present for a few days, frequency, urgency, suprapubic pressure and right costovertebral angle pain without constitutional symptoms, gross hematuria, GI symptoms, no vaginal discharge.  Past medical history:  Negative.  See flowsheet/record as well for more information.  Surgical history: Negative.  See flowsheet/record as well for more information.  Family history: Negative.  See flowsheet/record as well for more information.  Social history: Negative.  See flowsheet/record as well for more information.  Allergies, and medications have been entered into the medical record, reviewed, and no changes needed.   (To billers/coders, pertinent past medical, social, surgical, family history can be found in problem list, if problem list is marked as reviewed then this indicates that past medical, social, surgical, family history was also reviewed)  Review of Systems: No fevers, chills, night sweats, weight loss, chest pain, or shortness of breath.   Objective:    General: Well Developed, well nourished, and in no acute distress.  Neuro: Alert and oriented x3, extra-ocular muscles intact, sensation grossly intact.  HEENT: Normocephalic, atraumatic, pupils equal round reactive to light, neck supple, no masses, no lymphadenopathy, thyroid nonpalpable.  Skin: Warm and dry, no rashes. Cardiac: Regular rate and  rhythm, no murmurs rubs or gallops, no lower extremity edema.  Respiratory: Clear to auscultation bilaterally. Not using accessory muscles, speaking in full sentences.  Procedure: Real-time Ultrasound Guided Injection of left elbow Device: GE Logiq E  Verbal informed consent obtained.  Time-out conducted.  Noted no overlying erythema, induration, or other signs of local infection.  Skin prepped in a sterile fashion.  Local anesthesia: Topical Ethyl chloride.  With sterile technique and under real time ultrasound guidance: Using a trans-anconeus approach I advanced a 25-gauge needle into the elbow joint and injected 1 cc kenalog 40, 1 cc lidocaine, 1 cc bupivacaine. Completed without difficulty  Pain immediately resolved suggesting accurate placement of the medication.  Advised to call if fevers/chills, erythema, induration, drainage, or persistent bleeding.  Images permanently stored and available for review in the ultrasound unit.  Impression: Technically successful ultrasound guided injection.  Urinalysis reviewed and is positive for leukocytes, no blood.  Impression and Recommendations:    Fracture of coronoid process of ulna, left, closed MRI showed partial healing of the coronoid process fracture, there was more an elbow joint effusion. Elbow joint injection under ultrasound guidance today. Return in 1 month.  Acute right pyelonephritis Starting Macrobid for 10 days, sending urine for culture, we will change antibiotics if needed. Diflucan if needed for yeast infection. ___________________________________________ Gwen Her. Dianah Field, M.D., ABFM., CAQSM. Primary Care and [place] Instructor of [place] [facility] of Medicine

## 2017-04-06 LAB — URINE CULTURE
MICRO NUMBER:: 81428743
SPECIMEN QUALITY:: ADEQUATE

## 2017-05-04 ENCOUNTER — Encounter: Payer: Self-pay | Admitting: Sports Medicine

## 2017-05-04 ENCOUNTER — Ambulatory Visit (INDEPENDENT_AMBULATORY_CARE_PROVIDER_SITE_OTHER): Payer: 59 | Admitting: Sports Medicine

## 2017-05-04 DIAGNOSIS — S52045G Nondisplaced fracture of coronoid process of left ulna, subsequent encounter for closed fracture with delayed healing: Secondary | ICD-10-CM

## 2017-05-04 DIAGNOSIS — N12 Tubulo-interstitial nephritis, not specified as acute or chronic: Secondary | ICD-10-CM | POA: Diagnosis not present

## 2017-05-04 NOTE — Assessment & Plan Note (Signed)
Lately resolved with antibiotics. Return as needed for this, she is looking to establish care with 1 of the providers here at this office.

## 2017-05-04 NOTE — Assessment & Plan Note (Signed)
Healed well from the coronoid process fracture but then developed elbow joint effusion. Injected this and her symptoms are now completely resolved, she has a bit of gelling likely representing mild osteoarthritis but no further intervention needed, she will keep a fairly consistent level of physical fitness.

## 2017-05-04 NOTE — Progress Notes (Signed)
Subjective:    CC: Recheck arm  HPI: Kara Cooper is a very pleasant 52 year old female, I initially treated her for a left coronoid process fracture of the elbow.  After we removed her cast she had significant swelling, pain in her elbow, ultrasound did show an effusion.  As her coronoid process fracture was healed we did an injection, she returns today and her elbow pain is completely resolved, she has full range of motion, only a bit of gelling when still for a long time.  In addition I treated her for a pyelonephritis, she reports symptoms are completely resolved.  She is looking for a PCP to establish within this office.  I reviewed the past medical history, family history, social history, surgical history, and allergies today and no changes were needed.  Please see the problem list section below in epic for further details.  Past Medical History: Past Medical History:  Diagnosis Date  . Anemia    in past  . Basal cell carcinoma    treated with Moh's procedure  . Chicken pox    in past  . Headache(784.0)    frequent  . History of seasonal allergies   . Irregular menses   . Migraine   . Other and unspecified ovarian cysts    come and go  . Postural orthostatic tachycardia syndrome    Dr. Burt Knack  . UTI (lower urinary tract infection)    history of   Past Surgical History: Past Surgical History:  Procedure Laterality Date  . CERVICAL CONIZATION W/BX  1987, 1988  . DILATION AND CURETTAGE OF UTERUS     Social History: Social History   Socioeconomic History  . Marital status: Legally Separated    Spouse name: None  . Number of children: 2  . Years of education: None  . Highest education level: None  Social Needs  . Financial resource strain: None  . Food insecurity - worry: None  . Food insecurity - inability: None  . Transportation needs - medical: None  . Transportation needs - non-medical: None  Occupational History  . Occupation: Therapist, sports  Tobacco Use  . Smoking  status: Never Smoker  . Smokeless tobacco: Never Used  Substance and Sexual Activity  . Alcohol use: Yes    Comment: Occasional  . Drug use: No  . Sexual activity: None  Other Topics Concern  . None  Social History Ambulance person.  Exercises every day, also cycles.   Family History: Family History  Problem Relation Age of Onset  . Hyperlipidemia Mother   . Heart disease Mother        A-fib, ablation  . Migraines Daughter   . Diabetes Daughter        type I  . Depression Daughter        bipolar   Allergies: No Known Allergies Medications: See med rec.  Review of Systems: No fevers, chills, night sweats, weight loss, chest pain, or shortness of breath.   Objective:    General: Well Developed, well nourished, and in no acute distress.  Neuro: Alert and oriented x3, extra-ocular muscles intact, sensation grossly intact.  HEENT: Normocephalic, atraumatic, pupils equal round reactive to light, neck supple, no masses, no lymphadenopathy, thyroid nonpalpable.  Skin: Warm and dry, no rashes. Cardiac: Regular rate and rhythm, no murmurs rubs or gallops, no lower extremity edema.  Respiratory: Clear to auscultation bilaterally. Not using accessory muscles, speaking in full sentences. Left elbow: Unremarkable to inspection. Range of motion full pronation, supination,  flexion, extension. Strength is full to all of the above directions Stable to varus, valgus stress. Negative moving valgus stress test. No discrete areas of tenderness to palpation. Ulnar nerve does not sublux. Negative cubital tunnel Tinel's.  Impression and Recommendations:    Fracture of coronoid process of ulna, left, closed Healed well from the coronoid process fracture but then developed elbow joint effusion. Injected this and her symptoms are now completely resolved, she has a bit of gelling likely representing mild osteoarthritis but no further intervention needed, she will keep a fairly consistent  level of physical fitness.  Acute right pyelonephritis Lately resolved with antibiotics. Return as needed for this, she is looking to establish care with 1 of the providers here at this office.  ___________________________________________ Gwen Her. Dianah Field, M.D., ABFM., CAQSM. Primary Care and [place] Instructor of [place] of [facility] of Medicine

## 2017-05-17 ENCOUNTER — Ambulatory Visit (INDEPENDENT_AMBULATORY_CARE_PROVIDER_SITE_OTHER): Payer: 59 | Admitting: Osteopathic Medicine

## 2017-05-17 ENCOUNTER — Encounter: Payer: Self-pay | Admitting: Osteopathic Medicine

## 2017-05-17 VITALS — BP 98/59 | HR 73 | Temp 98.0°F | Resp 16 | Wt 132.0 lb

## 2017-05-17 DIAGNOSIS — R7989 Other specified abnormal findings of blood chemistry: Secondary | ICD-10-CM | POA: Diagnosis not present

## 2017-05-17 DIAGNOSIS — R5382 Chronic fatigue, unspecified: Secondary | ICD-10-CM | POA: Insufficient documentation

## 2017-05-17 DIAGNOSIS — Z0000 Encounter for general adult medical examination without abnormal findings: Secondary | ICD-10-CM | POA: Diagnosis not present

## 2017-05-17 MED ORDER — SUMATRIPTAN SUCCINATE 50 MG PO TABS: 50.0000 mg | ORAL_TABLET | Freq: Every day | ORAL | 5 refills | Status: DC | PRN

## 2017-05-17 NOTE — Patient Instructions (Addendum)
Plan:   Think about colonoscopy versus Cologuard  Think about Shingles Vaccine  See printed info on advanced directives - let me know if any questions   Will see what thyroid levels are looking like and go from there!

## 2017-05-17 NOTE — Progress Notes (Signed)
HPI: Kara Cooper is a 51 y.o. female who  has a past medical history of Anemia, Basal cell carcinoma, Chicken pox, Headache(784.0), History of seasonal allergies, Irregular menses, Migraine, Other and unspecified ovarian cysts, Postural orthostatic tachycardia syndrome, and UTI (lower urinary tract infection).  she presents to [facility], 05/17/17,  for chief complaint of: Establish Care Concern for thyroid fxn  Thyroid: up and down, measured at [facility], Concerned for hair loss, fatigue.   Otherwise, fairly well. 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 2 kids. Currently going through a separation.   Preventive care reviewed as below.     Past medical, surgical, social and family history reviewed:  Patient Active Problem List   Diagnosis Date Noted  . Acute right pyelonephritis 04/04/2017  . Fracture of coronoid process of ulna, left, closed 01/24/2017  . Viral URI with cough 09/27/2013  . Stress reaction 09/27/2013  . Routine general medical examination at a health care facility 12/19/2012  . Spells 05/19/2011  . Syncope/dysautomia 01/03/2011  . ALLERGIC RHINITIS 12/29/2008  . MIGRAINE, CLASSICAL 06/19/2008    Past Surgical History:  Procedure Laterality Date  . CERVICAL CONIZATION W/BX  1987, 1988  . DILATION AND CURETTAGE OF UTERUS      Social History   Tobacco Use  . Smoking status: Never Smoker  . Smokeless tobacco: Never Used  Substance Use Topics  . Alcohol use: Yes    Comment: Occasional    Family History  Problem Relation Age of Onset  . Hyperlipidemia Mother   . Heart disease Mother        A-fib, ablation  . Migraines Daughter   . Diabetes Daughter        type I  . Depression Daughter        bipolar     Current medication list and allergy/intolerance information reviewed:    Current Outpatient Medications  Medication Sig Dispense Refill  . acetaminophen (TYLENOL) 500  MG tablet Take OTC as directed     . ALPRAZolam (XANAX) 0.5 MG tablet Take 1 tablet (0.5 mg total) by mouth as needed for anxiety. 10 tablet 0  . b complex vitamins tablet Take 1 tablet by mouth daily.    Marland Kitchen BLACK COHOSH HOT FLASH RELIEF PO Take by mouth.    . fluticasone (FLONASE) 50 MCG/ACT nasal spray USE 2 SPRAYS IN EACH NOSTRIL DAILY AS NEEDED 16 g 11  . Multiple Vitamin (MULTIVITAMIN) tablet Take 1 tablet by mouth daily.      . SUMAtriptan (IMITREX) 50 MG tablet Take 1 tablet (50 mg total) by mouth daily as needed for migraine. Take one by mouth times one as needed for migraine- max one pill in a day. 10 tablet 5   No current facility-administered medications for this visit.     No Known Allergies    Review of Systems:  Constitutional:  No  fever, no chills, No recent illness, No unintentional weight changes. No significant fatigue.   HEENT: +headache, no vision change, no hearing change, No sore throat, No  sinus pressure  Cardiac: No  chest pain, No  pressure, No palpitations, No  Orthopnea  Respiratory:  No  shortness of breath. No  Cough  Gastrointestinal: No  abdominal pain, No  nausea, No  vomiting,  No  blood in stool, No  diarrhea, No  constipation   Musculoskeletal: No new myalgia/arthralgia  Skin: No  Rash, No other wounds/concerning lesions  Genitourinary:  No  incontinence, No  abnormal genital bleeding, No abnormal genital discharge  Hem/Onc: No  easy bruising/bleeding, No  abnormal lymph node  Endocrine: No cold intolerance,  No heat intolerance. No polyuria/polydipsia/polyphagia   Neurologic: No  weakness, No  dizziness, No  slurred speech/focal weakness/facial droop  Psychiatric: No  concerns with depression, No  concerns with anxiety, +sleep problems, No mood problems  Exam:  BP (!) 98/59 (BP Location: Right Arm, Patient Position: Sitting, Cuff Size: Small)   Pulse 73   Temp 98 F (36.7 C) (Oral)   Resp 16   Wt 132 lb (59.9 kg)   LMP 05/03/2017  (Approximate)   SpO2 99%   BMI 24.14 kg/m   Constitutional: VS see above. General Appearance: alert, well-developed, well-nourished, NAD  Eyes: Normal lids and conjunctive, non-icteric sclera  Ears, Nose, Mouth, Throat: MMM, Normal external inspection ears/nares/mouth/lips/gums. TM normal bilaterally. Pharynx/tonsils no erythema, no exudate. Nasal mucosa normal.   Neck: No masses, trachea midline. No thyroid enlargement. No tenderness/mass appreciated. No lymphadenopathy  Respiratory: Normal respiratory effort. no wheeze, no rhonchi, no rales  Cardiovascular: S1/S2 normal, no murmur, no rub/gallop auscultated. RRR. No lower extremity edema.   Gastrointestinal: Nontender, no masses. No hepatomegaly, no splenomegaly. No hernia appreciated. Bowel sounds normal. Rectal exam deferred.   Musculoskeletal: Gait normal. No clubbing/cyanosis of digits.   Neurological: Normal balance/coordination. No tremor. No cranial nerve deficit on limited exam.   Skin: warm, dry, intact.   Psychiatric: Normal judgment/insight. Normal mood and affect. Oriented x3.     ASSESSMENT/PLAN:   Annual physical exam  Chronic fatigue - Plan: CBC, COMPLETE METABOLIC PANEL WITH GFR, Lipid panel, TSH, T4, free, VITAMIN D 25 Hydroxy (Vit-D Deficiency, Fractures), CP4508-PT/INR AND PTT, Vitamin B12  Abnormal TSH - Plan: Lipid panel, TSH, T4, free     FEMALE PREVENTIVE CARE Updated 05/17/17   ANNUAL SCREENING/COUNSELING  Diet/Exercise - HEALTHY HABITS DISCUSSED TO DECREASE CV RISK Social History   Tobacco Use  Smoking Status Never Smoker  Smokeless Tobacco Never Used   Social History   Substance and Sexual Activity  Alcohol Use Yes   Comment: Occasional    Depression screen PHQ 2/9 01/31/2017  Decreased Interest 0  Down, Depressed, Hopeless 0  PHQ - 2 Score 0     Domestic violence concerns - no  HTN SCREENING - SEE VITALS  SEXUAL HEALTH  Sexually active in the past year - Yes with  female.  Need/want STI testing today? - no  Concerns about libido or pain with sex? - no  Plans for pregnancy? - none  INFECTIOUS DISEASE SCREENING  HIV - does not need  GC/CT - does not need  HepC - DOB [birth year] - does not need  TB - does not need  DISEASE SCREENING  Lipid - needs  DM2 - needs  Osteoporosis - women age 70+ - does not need  CANCER SCREENING  Cervical - does not need - sees OBGYN  Breast - does not need - sees OBGYN  Lung - does not need  Colon - Cologuard vs Colonoscopy - biological father hx not fully known, died of CA not sure which   ADULT VACCINATION  Influenza - annual vaccine recommended  Td - booster every 10 years   Zoster - Shingrix recommended 50+   PCV13 - was not indicated  PPSV23 - was not indicated  There is no immunization history on file for this patient.  OTHER  Fall - exercise and Vit D age 25+ - does  not need      Patient Instructions  Plan:   Think about colonoscopy versus Cologuard  Think about Shingles Vaccine  See printed info on advanced directives - let me know if any questions   Will see what thyroid levels are looking like and go from there!      Visit summary with medication list and pertinent instructions was printed for patient to review. All questions at time of visit were answered - patient instructed to contact office with any additional concerns. ER/RTC precautions were reviewed with the patient.   Follow-up plan: Return in about 1 year (around 05/17/2018) for Pax, sooner if needed .    Please note: voice recognition software was used to produce this document, and typos may escape review. Please contact Dr. Sheppard Coil for any needed clarifications.

## 2017-05-19 DIAGNOSIS — R5382 Chronic fatigue, unspecified: Secondary | ICD-10-CM | POA: Diagnosis not present

## 2017-05-19 DIAGNOSIS — R7989 Other specified abnormal findings of blood chemistry: Secondary | ICD-10-CM | POA: Diagnosis not present

## 2017-05-20 LAB — LIPID PANEL
CHOLESTEROL: 194 mg/dL (ref <200)
HDL: 70 mg/dL (ref >50)
LDL Cholesterol (Calc): 110 mg/dL (calc) — ABNORMAL HIGH
Non-HDL Cholesterol (Calc): 124 mg/dL (calc) (ref <130)
Total CHOL/HDL Ratio: 2.8 (calc) (ref <5.0)
Triglycerides: 55 mg/dL (ref <150)

## 2017-05-20 LAB — COMPLETE METABOLIC PANEL WITH GFR
AG RATIO: 1.8 (calc) (ref 1.0–2.5)
ALBUMIN MSPROF: 4.3 g/dL (ref 3.6–5.1)
ALKALINE PHOSPHATASE (APISO): 44 U/L (ref 33–130)
ALT: 19 U/L (ref 6–29)
AST: 22 U/L (ref 10–35)
BILIRUBIN TOTAL: 0.5 mg/dL (ref 0.2–1.2)
BUN: 18 mg/dL (ref 7–25)
CHLORIDE: 108 mmol/L (ref 98–110)
CO2: 28 mmol/L (ref 20–32)
Calcium: 9.3 mg/dL (ref 8.6–10.4)
Creat: 0.8 mg/dL (ref 0.50–1.05)
GFR, EST AFRICAN AMERICAN: 100 mL/min/{1.73_m2} (ref >=60)
GFR, Est Non African American: 86 mL/min/{1.73_m2} (ref >=60)
Globulin: 2.4 g/dL (calc) (ref 1.9–3.7)
Glucose, Bld: 88 mg/dL (ref 65–99)
POTASSIUM: 4.4 mmol/L (ref 3.5–5.3)
Sodium: 142 mmol/L (ref 135–146)
TOTAL PROTEIN: 6.7 g/dL (ref 6.1–8.1)

## 2017-05-20 LAB — CP4508-PT/INR AND PTT
APTT: 27 s (ref 22–34)
INR: 1
PROTHROMBIN TIME: 10.3 s (ref 9.0–11.5)

## 2017-05-20 LAB — VITAMIN D 25 HYDROXY (VIT D DEFICIENCY, FRACTURES): Vit D, 25-Hydroxy: 45 ng/mL (ref 30–100)

## 2017-05-20 LAB — CBC
HCT: 37.8 % (ref 35.0–45.0)
HEMOGLOBIN: 13 g/dL (ref 11.7–15.5)
MCH: 32.4 pg (ref 27.0–33.0)
MCHC: 34.4 g/dL (ref 32.0–36.0)
MCV: 94.3 fL (ref 80.0–100.0)
MPV: 11.3 fL (ref 7.5–12.5)
PLATELETS: 229 10*3/uL (ref 140–400)
RBC: 4.01 10*6/uL (ref 3.80–5.10)
RDW: 11.8 % (ref 11.0–15.0)
WBC: 4.8 10*3/uL (ref 3.8–10.8)

## 2017-05-20 LAB — T4, FREE: FREE T4: 0.9 ng/dL (ref 0.8–1.8)

## 2017-05-20 LAB — VITAMIN B12: Vitamin B-12: 324 pg/mL (ref 200–1100)

## 2017-05-20 LAB — TSH: TSH: 5.34 m[IU]/L — AB

## 2017-05-22 ENCOUNTER — Encounter: Payer: Self-pay | Admitting: Osteopathic Medicine

## 2017-05-23 ENCOUNTER — Encounter: Payer: Self-pay | Admitting: Osteopathic Medicine

## 2017-05-23 DIAGNOSIS — E039 Hypothyroidism, unspecified: Secondary | ICD-10-CM

## 2017-05-23 DIAGNOSIS — E038 Other specified hypothyroidism: Secondary | ICD-10-CM

## 2017-05-23 MED ORDER — LEVOTHYROXINE SODIUM 25 MCG PO TABS: 25.0000 ug | ORAL_TABLET | Freq: Every day | ORAL | 0 refills | Status: DC

## 2017-05-23 MED FILL — LEVOTHYROXINE 25 MCG TABLET: 25 | 90 days supply | Qty: 90 | Fill #0

## 2017-06-02 DIAGNOSIS — H524 Presbyopia: Secondary | ICD-10-CM | POA: Diagnosis not present

## 2017-06-02 DIAGNOSIS — H52221 Regular astigmatism, right eye: Secondary | ICD-10-CM | POA: Diagnosis not present

## 2017-06-02 DIAGNOSIS — H5203 Hypermetropia, bilateral: Secondary | ICD-10-CM | POA: Diagnosis not present

## 2017-07-05 ENCOUNTER — Ambulatory Visit: Payer: 59 | Admitting: Internal Medicine

## 2017-07-05 ENCOUNTER — Ambulatory Visit (INDEPENDENT_AMBULATORY_CARE_PROVIDER_SITE_OTHER): Payer: 59 | Admitting: Internal Medicine

## 2017-07-05 ENCOUNTER — Encounter: Payer: Self-pay | Admitting: Internal Medicine

## 2017-07-05 VITALS — BP 110/62 | HR 81 | Ht 62.0 in | Wt 132.8 lb

## 2017-07-05 DIAGNOSIS — E039 Hypothyroidism, unspecified: Secondary | ICD-10-CM

## 2017-07-05 DIAGNOSIS — E063 Autoimmune thyroiditis: Secondary | ICD-10-CM | POA: Insufficient documentation

## 2017-07-05 DIAGNOSIS — E038 Other specified hypothyroidism: Secondary | ICD-10-CM | POA: Insufficient documentation

## 2017-07-05 NOTE — Progress Notes (Signed)
Patient ID: Kara Cooper, female   DOB: [birth year], 52 y.o.   MRN: [id number]    HPI  Kara Cooper is a 52 y.o.-year-old female, referred by her PCP, Dr. Sheppard Coil, for management of hypothyroidism.  Pt. has been dx with hypothyroidism in 2015 (but had an elevated TSH in 2009 at her visit with OB/GYN) >> started on Levothyroxine 25 mcg 05/2017.  She does not feel much different after she started it.  She takes the thyroid hormone: - fasting - with water - with coffee + creamer (!) - separated by 2h from b'fast  - no calcium, iron, PPIs - + multivitamins 1h later (!) - + B complex, + Biotin 1000 mcg  I reviewed pt's thyroid tests: Lab Results  Component Value Date   TSH 5.34 (H) 05/19/2017   TSH 5.40 12/19/2012   TSH 3.74 01/03/2011   TSH 4.28 09/03/2010   TSH 4.05 06/19/2008   FREET4 0.9 05/19/2017  No results found for: T3FREE   Pt describes: - no weight gain - + fatigue - + heat intolerance (hot flushes) - no depression - no constipation - no dry skin - + hair loss  Pt denies feeling nodules in neck, hoarseness, dysphagia/odynophagia, SOB with lying down.  She has + FH of thyroid disorders. No FH of thyroid cancer.  No h/o radiation tx to head or neck. No recent use of iodine supplements.  Pt. also has a history of migraines, fibroids-status post fibroidectomy 2017, skin cancer.    She is exercising 2-3 times a week: Cardio, cycling, walking, weights  ROS: Constitutional:+ se HPI Eyes: no blurry vision, no xerophthalmia ENT: no sore throat, + see HPI Cardiovascular: no CP/SOB/palpitations/leg swelling Respiratory: no cough/SOB Gastrointestinal: no N/V/D/C Musculoskeletal: no muscle/+ joint aches - fingers Skin: no rashes Neurological: no tremors/numbness/tingling/dizziness Psychiatric: no depression/anxiety  Past Medical History:  Diagnosis Date  . Acute right pyelonephritis 04/04/2017  . Anemia    in past  . Basal cell carcinoma    treated with  Moh's procedure  . Chicken pox    in past  . Headache(784.0)    frequent  . History of seasonal allergies   . Irregular menses   . Migraine   . Other and unspecified ovarian cysts    come and go  . Postural orthostatic tachycardia syndrome    Dr. Burt Knack  . UTI (lower urinary tract infection)    history of   Past Surgical History:  Procedure Laterality Date  . CERVICAL CONIZATION W/BX  1987, 1988  . DILATION AND CURETTAGE OF UTERUS     Social History   Socioeconomic History  . Marital status: Legally Separated    Spouse name: Not on file  . Number of children: 2  . Years of education: Not on file  . Highest education level: Not on file  Social Needs  . Financial resource strain: Not on file  . Food insecurity - worry: Not on file  . Food insecurity - inability: Not on file  . Transportation needs - medical: Not on file  . Transportation needs - non-medical: Not on file  Occupational History  . Occupation: Therapist, sports  Tobacco Use  . Smoking status: Never Smoker  . Smokeless tobacco: Never Used  Substance and Sexual Activity  . Alcohol use: Yes    Comment: Occasional  . Drug use: No  . Sexual activity: Not on file  Other Topics Concern  . Not on file  Social History Narrative   Student.  Exercises every day,  also cycles.   Current Outpatient Medications on File Prior to Visit  Medication Sig Dispense Refill  . acetaminophen (TYLENOL) 500 MG tablet Take OTC as directed     . ALPRAZolam (XANAX) 0.5 MG tablet Take 1 tablet (0.5 mg total) by mouth as needed for anxiety. 10 tablet 0  . b complex vitamins tablet Take 1 tablet by mouth daily.    Marland Kitchen BIOTIN PO Take 1 tablet by mouth daily.     Marland Kitchen BLACK COHOSH HOT FLASH RELIEF PO Take by mouth.    . Cyanocobalamin (B-12) 1000 MCG SUBL Place under the tongue daily.     . fluticasone (FLONASE) 50 MCG/ACT nasal spray USE 2 SPRAYS IN EACH NOSTRIL DAILY AS NEEDED 16 g 11  . levothyroxine (SYNTHROID, LEVOTHROID) 25 MCG tablet Take 1  tablet (25 mcg total) by mouth daily before breakfast. Lab recheck approx 07/21/2017 90 tablet 0  . Multiple Vitamin (MULTIVITAMIN) tablet Take 1 tablet by mouth daily.      . SUMAtriptan (IMITREX) 50 MG tablet Take 1 tablet (50 mg total) by mouth daily as needed for migraine. Take one by mouth times one as needed for migraine- max one pill in a day. 10 tablet 5   No current facility-administered medications on file prior to visit.    No Known Allergies Family History  Problem Relation Age of Onset  . Hyperlipidemia Mother   . Heart disease Mother        A-fib, ablation  . Migraines Daughter   . Diabetes Daughter        type I  . Depression Daughter        bipolar    PE: BP 110/62 (BP Location: Left Arm, Patient Position: Sitting, Cuff Size: Large)   Pulse 81   Ht 5\' 2"  (1.575 m)   Wt 132 lb 12.8 oz (60.2 kg)   SpO2 99%   BMI 24.29 kg/m  Wt Readings from Last 3 Encounters:  07/05/17 132 lb 12.8 oz (60.2 kg)  05/17/17 132 lb (59.9 kg)  05/04/17 130 lb (59 kg)   Constitutional: normal weight, in NAD Eyes: PERRLA, EOMI, no exophthalmos ENT: moist mucous membranes, no thyromegaly, no cervical lymphadenopathy Cardiovascular: RRR, No MRG Respiratory: CTA B Gastrointestinal: abdomen soft, NT, ND, BS+ Musculoskeletal: no deformities, strength intact in all 4 Skin: moist, warm, no rashes Neurological: no tremor with outstretched hands, DTR normal in all 4  ASSESSMENT: 1. Subclinical Hypothyroidism  PLAN:  1. Patient with few years of very mild hypothyroidism, now started on low dose levothyroxine - 25 mcg daily 1.5 months ago.  She is not feeling different. - she appears euthyroid,  But does complain of fatigue, hair loss, heat intolerance. - she does not appear to have a goiter, thyroid nodules, or neck compression symptoms - We discussed about correct intake of levothyroxine, fasting, with water, separated by at least 30 minutes from breakfast, and separated by more than 4  hours from calcium, iron, multivitamins, acid reflux medications (PPIs).  She is not taking it correctly - I advised her to move her coffee with creamer at least 30 minutes later, and to move her multivitamins at least 4 hours later.  As she is also on biotin, I advised her to skip the biotin at least a week prior to coming back for labs. - will check thyroid tests TPO and ATA Ab's. we discussed about the possibility of her having Hashimoto's thyroiditis especially since she already has family history of an autoimmune condition (  her daughter with type 1 diabetes-she is also my patient). We also discussed about ways to improve her immune system and also possibly using selenium in case her antibodies are elevated. - we will have her back for repeat TFTs 5-6 weeks after she starts taking the medication correctly - I will see her back in 6 months  Orders Placed This Encounter  Procedures  . TSH  . T4, free  . Thyroglobulin antibody  . Thyroid peroxidase antibody   Office Visit on 07/05/2017  Component Date Value Ref Range Status  . Thyroperoxidase Ab SerPl-aCnc 07/05/2017 141* <9 IU/mL Final  . Thyroglobulin Ab 07/05/2017 <1  < or = 1 IU/mL Final   Msg sent: Dear Ms Cherylann Banas, The results of your thyroid antibodies are back and one of them is high, pointing towards Hashimoto's thyroiditis, as we suspected.  You can try selenium, as discussed, 200 mcg daily. This is over-the-counter. Sincerely, Philemon Kingdom MD  Philemon Kingdom, MD PhD [facility]

## 2017-07-05 NOTE — Patient Instructions (Addendum)
Please stop at the lab.  Please continue Levothyroxine 25 mcg daily.  Take the thyroid hormone every day, with water, at least 30 minutes before breakfast, separated by at least 4 hours from: - acid reflux medications - calcium - iron - multivitamins  Please always stop Biotin ~ 1 week before coming for thyroid labs.  Please come back for a follow-up appointment in 6 months.

## 2017-07-06 LAB — THYROGLOBULIN ANTIBODY: Thyroglobulin Ab: <1 IU/mL (ref <=1)

## 2017-07-06 LAB — THYROID PEROXIDASE ANTIBODY: Thyroperoxidase Ab SerPl-aCnc: 141 IU/mL — ABNORMAL HIGH (ref <9)

## 2017-07-14 ENCOUNTER — Encounter: Payer: Self-pay | Admitting: Osteopathic Medicine

## 2017-07-17 ENCOUNTER — Ambulatory Visit: Payer: 59 | Admitting: Internal Medicine

## 2017-08-22 ENCOUNTER — Encounter: Payer: Self-pay | Admitting: Internal Medicine

## 2017-08-22 ENCOUNTER — Other Ambulatory Visit: Payer: Self-pay | Admitting: *Deleted

## 2017-08-22 NOTE — Telephone Encounter (Signed)
LOV with you: 07/05/17 Next OV: 01/05/18 No TSH resulted for you.   Ok to Rf Levothyroxine 25 mcg?

## 2017-08-23 ENCOUNTER — Other Ambulatory Visit: Payer: Self-pay | Admitting: Internal Medicine

## 2017-08-23 MED ORDER — LEVOTHYROXINE SODIUM 25 MCG PO TABS: 25.0000 ug | ORAL_TABLET | Freq: Every day | ORAL | 3 refills | Status: DC

## 2017-08-23 MED FILL — LEVOTHYROXINE 25 MCG TABLET: 25 | 90 days supply | Qty: 90 | Fill #0 | Status: TO

## 2017-10-05 LAB — HM MAMMOGRAPHY

## 2017-10-05 LAB — HM PAP SMEAR: HM Pap smear: NEGATIVE

## 2017-10-22 ENCOUNTER — Encounter: Payer: Self-pay | Admitting: Osteopathic Medicine

## 2017-10-24 ENCOUNTER — Encounter: Payer: Self-pay | Admitting: Internal Medicine

## 2017-10-24 ENCOUNTER — Telehealth: Payer: Self-pay | Admitting: Osteopathic Medicine

## 2017-10-24 NOTE — Telephone Encounter (Signed)
Got it!!! Thank you! Dr. Cruzita Lederer wanted me to get follow up labs for my thyroid. Can i get them while i am there at your location? Not sure if there is an order in from her. Thanks!!! See email from patient on 7/7

## 2017-10-24 NOTE — Telephone Encounter (Signed)
Replied to Pt via MyChart.

## 2017-10-24 NOTE — Telephone Encounter (Signed)
Thank you :)

## 2017-10-25 ENCOUNTER — Ambulatory Visit (INDEPENDENT_AMBULATORY_CARE_PROVIDER_SITE_OTHER): Payer: 59

## 2017-10-25 ENCOUNTER — Encounter: Payer: Self-pay | Admitting: Osteopathic Medicine

## 2017-10-25 ENCOUNTER — Other Ambulatory Visit: Payer: Self-pay

## 2017-10-25 ENCOUNTER — Ambulatory Visit (INDEPENDENT_AMBULATORY_CARE_PROVIDER_SITE_OTHER): Payer: 59 | Admitting: Osteopathic Medicine

## 2017-10-25 VITALS — BP 106/59 | HR 70 | Temp 98.2°F | Wt 128.1 lb

## 2017-10-25 DIAGNOSIS — M19042 Primary osteoarthritis, left hand: Secondary | ICD-10-CM

## 2017-10-25 DIAGNOSIS — F418 Other specified anxiety disorders: Secondary | ICD-10-CM

## 2017-10-25 DIAGNOSIS — M79645 Pain in left finger(s): Secondary | ICD-10-CM

## 2017-10-25 DIAGNOSIS — M79644 Pain in right finger(s): Secondary | ICD-10-CM | POA: Diagnosis not present

## 2017-10-25 DIAGNOSIS — M19041 Primary osteoarthritis, right hand: Secondary | ICD-10-CM | POA: Diagnosis not present

## 2017-10-25 DIAGNOSIS — E039 Hypothyroidism, unspecified: Secondary | ICD-10-CM

## 2017-10-25 MED ORDER — ESCITALOPRAM OXALATE 10 MG PO TABS: 10.0000 mg | ORAL_TABLET | Freq: Every day | ORAL | 1 refills | Status: DC

## 2017-10-25 NOTE — Progress Notes (Signed)
HPI: Kara Cooper is a 52 y.o. female who  has a past medical history of Acute right pyelonephritis (04/04/2017), Anemia, Basal cell carcinoma, Chicken pox, Headache(784.0), History of seasonal allergies, Irregular menses, Migraine, Other and unspecified ovarian cysts, Postural orthostatic tachycardia syndrome, and UTI (lower urinary tract infection).  she presents to [facility] today, 10/25/17,  for chief complaint of:  Depression Hand pain  Hand pain: Bump on her L index and R little fingers, ongoing a few months but worse lately, joints are stiff and tender. No erythema/effusion. No hx gout.   Moods:  Going through some situational issues, separation for a few months. Not coping very well, not rebounding like she thought she should. Hasn't taken Xanax in years. No other meds for dep/anx (Xanax was after her father's death a few years ago). Reports difficulty sleeping, OTC meds not helping much or are making hre groggy the next day. Has gone though counseling previously, felt this helped a little bit, has been working on coping.   Seeing endocrine for thyroid care, due for repeat TSH.    Depression screen [facility] 2/9 10/25/2017 01/31/2017 12/19/2012  Decreased Interest 1 0 0  Down, Depressed, Hopeless 1 0 0  PHQ - 2 Score 2 0 0  Altered sleeping 3 - -  Tired, decreased energy 3 - -  Change in appetite 0 - -  Feeling bad or failure about yourself  1 - -  Trouble concentrating 3 - -  Moving slowly or fidgety/restless 2 - -  Suicidal thoughts 0 - -  PHQ-9 Score 14 - -  Difficult doing work/chores Somewhat difficult - -   GAD 7 : Generalized Anxiety Score 10/25/2017  Nervous, Anxious, on Edge 0  Control/stop worrying 2  Worry too much - different things 2  Trouble relaxing 1  Restless 1  Easily annoyed or irritable 2  Afraid - awful might happen 0  Total GAD 7 Score 8  Anxiety Difficulty Somewhat difficult        Past medical history,  surgical history, and family history reviewed.  Current medication list and allergy/intolerance information reviewed.   (See remainder of HPI, ROS, Phys Exam below)    ASSESSMENT/PLAN:   Depression with anxiety - Plan: escitalopram (LEXAPRO) 10 MG tablet  Pain in finger of both hands - seems more likely arthritic nodes or synovial cysts than gout, XR pending. Sports med referral  - Plan: DG Finger Index Left, DG Finger Little Right, DG Hand 2 View Left, DG Hand 2 View Right   Meds ordered this encounter  Medications  . escitalopram (LEXAPRO) 10 MG tablet    Sig: Take 1 tablet (10 mg total) by mouth daily. (can take 1/2 tablet for the first week then increase to 1 tablet)    Dispense:  30 tablet    Refill:  1     Follow-up plan: Return for rehcekc hands w/ Dr T, recheck on Lexapro w/ Dr A in 4-6 weeks - sooner if needed.     ############################################ ############################################ ############################################ ############################################    Outpatient Encounter Medications as of 10/25/2017  Medication Sig  . acetaminophen (TYLENOL) 500 MG tablet Take OTC as directed   . ALPRAZolam (XANAX) 0.5 MG tablet Take 1 tablet (0.5 mg total) by mouth as needed for anxiety.  Marland Kitchen b complex vitamins tablet Take 1 tablet by mouth daily.  Marland Kitchen BIOTIN PO Take 1 tablet by mouth daily.   Marland Kitchen BLACK COHOSH HOT FLASH RELIEF PO Take by  mouth.  . Cyanocobalamin (B-12) 1000 MCG SUBL Place under the tongue daily.   . fluticasone (FLONASE) 50 MCG/ACT nasal spray USE 2 SPRAYS IN EACH NOSTRIL DAILY AS NEEDED  . levothyroxine (SYNTHROID, LEVOTHROID) 25 MCG tablet Take 1 tablet (25 mcg total) by mouth daily before breakfast.  . Multiple Vitamin (MULTIVITAMIN) tablet Take 1 tablet by mouth daily.    . SUMAtriptan (IMITREX) 50 MG tablet Take 1 tablet (50 mg total) by mouth daily as needed for migraine. Take one by mouth times one as needed for  migraine- max one pill in a day.   No facility-administered encounter medications on file as of 10/25/2017.    No Known Allergies    Review of Systems:  Constitutional: No recent illness  HEENT: No  headache, no vision change  Cardiac: No  chest pain, No  pressure, No palpitations  Respiratory:  No  shortness of breath. No  Cough  Gastrointestinal: No  abdominal pain  Musculoskeletal: +new myalgia/arthralgia as per HPI   Skin: No  Rash  Neurologic: No  weakness, No  Dizziness  Psychiatric: +concerns with depression, +concerns with anxiety  Exam:  BP (!) 106/59   Pulse 70   Temp 98.2 F (36.8 C) (Oral)   Wt 128 lb 1.6 oz (58.1 kg)   BMI 23.43 kg/m   Constitutional: VS see above. General Appearance: alert, well-developed, well-nourished, NAD  Eyes: Normal lids and conjunctive, non-icteric sclera  Ears, Nose, Mouth, Throat: MMM, Normal external inspection ears/nares/mouth/lips/gums.  Neck: No masses, trachea midline.   Respiratory: Normal respiratory effort. no wheeze, no rhonchi, no rales  Cardiovascular: S1/S2 normal, no murmur, no rub/gallop auscultated. RRR.   Musculoskeletal: Gait normal. Symmetric and independent movement of all extremities. Nodules at DIP L 2nd and R 5th digits, ?cystic vs bony nodule, no effusion, normal ROM but mild tenderness to palpatoin   Neurological: Normal balance/coordination. No tremor.  Skin: warm, dry, intact.   Psychiatric: Normal judgment/insight. Normal mood and affect. Oriented x3.   Visit summary with medication list and pertinent instructions was printed for patient to review, advised to alert [place] if any changes needed. All questions at time of visit were answered - patient instructed to contact office with any additional concerns. ER/RTC precautions were reviewed with the patient and understanding verbalized.   Follow-up plan: Return for rehcekc hands w/ Dr T, recheck on Lexapro w/ Dr A in 4-6 weeks - sooner if  needed.  Note: Total time spent 25 minutes, greater than 50% of the visit was spent face-to-face counseling and coordinating care for the following: The primary encounter diagnosis was Depression with anxiety. A diagnosis of Pain in finger of both hands was also pertinent to this visit.Marland Kitchen  Please note: voice recognition software was used to produce this document, and typos may escape review. Please contact Dr. Sheppard Coil for any needed clarifications.

## 2017-10-27 NOTE — Progress Notes (Signed)
Pt has seen results on MyChart and message also sent for patient to call back if any questions.

## 2017-11-09 ENCOUNTER — Other Ambulatory Visit: Payer: Self-pay | Admitting: Internal Medicine

## 2017-11-09 DIAGNOSIS — E039 Hypothyroidism, unspecified: Secondary | ICD-10-CM

## 2017-11-13 ENCOUNTER — Telehealth: Payer: Self-pay | Admitting: Osteopathic Medicine

## 2017-11-13 ENCOUNTER — Other Ambulatory Visit (INDEPENDENT_AMBULATORY_CARE_PROVIDER_SITE_OTHER): Payer: 59

## 2017-11-13 ENCOUNTER — Encounter: Payer: Self-pay | Admitting: Internal Medicine

## 2017-11-13 DIAGNOSIS — E039 Hypothyroidism, unspecified: Secondary | ICD-10-CM

## 2017-11-13 DIAGNOSIS — F418 Other specified anxiety disorders: Secondary | ICD-10-CM

## 2017-11-13 LAB — T4, FREE: Free T4: 0.68 ng/dL (ref 0.60–1.60)

## 2017-11-13 LAB — TSH: TSH: 3.11 u[IU]/mL (ref 0.35–4.50)

## 2017-11-13 MED ORDER — ESCITALOPRAM OXALATE 10 MG PO TABS: 10.0000 mg | ORAL_TABLET | Freq: Every day | ORAL | 1 refills | Status: DC

## 2017-11-13 NOTE — Telephone Encounter (Signed)
Sounds good, refill sent in case she needs it

## 2017-11-13 NOTE — Telephone Encounter (Signed)
Please call patient: Just calling to check up and see how she is doing on the Lexapro, we started 2 weeks ago.  Additionally, she is also having hand pain issues would encourage follow-up with sports medicine.

## 2017-11-13 NOTE — Telephone Encounter (Signed)
Spoke with Pt, she is tolerating Rx change well. She is up to the full dose of Lexapro 10mg  now, and it is "working great." No questions and no Rx changes needed.

## 2017-11-13 NOTE — Telephone Encounter (Signed)
-----   Message from Emeterio Reeve, DO sent at 10/25/2017  1:28 PM EDT ----- Regarding: dep/anx 2 week recheck lexapro

## 2017-11-16 NOTE — Telephone Encounter (Signed)
Pt has been updated.  

## 2017-11-22 ENCOUNTER — Ambulatory Visit: Payer: 59 | Admitting: Osteopathic Medicine

## 2017-11-23 ENCOUNTER — Ambulatory Visit: Payer: 59 | Admitting: Osteopathic Medicine

## 2017-12-01 ENCOUNTER — Ambulatory Visit (INDEPENDENT_AMBULATORY_CARE_PROVIDER_SITE_OTHER): Payer: 59 | Admitting: Osteopathic Medicine

## 2017-12-01 ENCOUNTER — Encounter: Payer: Self-pay | Admitting: Osteopathic Medicine

## 2017-12-01 VITALS — BP 102/50 | HR 74 | Wt 131.2 lb

## 2017-12-01 DIAGNOSIS — Z23 Encounter for immunization: Secondary | ICD-10-CM

## 2017-12-01 DIAGNOSIS — F418 Other specified anxiety disorders: Secondary | ICD-10-CM

## 2017-12-01 MED ORDER — ESCITALOPRAM OXALATE 10 MG PO TABS: 15.0000 mg | ORAL_TABLET | Freq: Every day | ORAL | 1 refills | Status: DC

## 2017-12-01 MED ORDER — ALPRAZOLAM 0.5 MG PO TABS: 0.2500 mg | ORAL_TABLET | Freq: Two times a day (BID) | ORAL | 0 refills | Status: DC | PRN

## 2017-12-01 NOTE — Patient Instructions (Signed)
Will try going up to 1.5 tablets of Lexapro for 2 weeks or so. If doing okay, can continue, or can increase to 2 tablets daily. Let me know how it's going!   Xanax if needed.

## 2017-12-01 NOTE — Progress Notes (Signed)
HPI: Kara Cooper is a 52 y.o. female who  has a past medical history of Acute right pyelonephritis (04/04/2017), Anemia, Basal cell carcinoma, Chicken pox, Headache(784.0), History of seasonal allergies, Irregular menses, Migraine, Other and unspecified ovarian cysts, Postural orthostatic tachycardia syndrome, and UTI (lower urinary tract infection).  she presents to [facility] today, 12/01/17,  for chief complaint of:  Follow up anxiety/depression  Seen about a month ago for anxiety/depression, feeling a bit more on edge lately.  We decided to start Lexapro 10 mg.  She is doing well on this medication.  Just closed on a house, will be moving soon.  Still having some issues with sleep.    Depression screen [facility] 2/9 12/01/2017 10/25/2017 01/31/2017  Decreased Interest 1 1 0  Down, Depressed, Hopeless 0 1 0  PHQ - 2 Score 1 2 0  Altered sleeping 3 3 -  Tired, decreased energy 3 3 -  Change in appetite 0 0 -  Feeling bad or failure about yourself  0 1 -  Trouble concentrating 2 3 -  Moving slowly or fidgety/restless 1 2 -  Suicidal thoughts 0 0 -  PHQ-9 Score 10 14 -  Difficult doing work/chores Very difficult Somewhat difficult -   GAD 7 : Generalized Anxiety Score 12/01/2017 10/25/2017  Nervous, Anxious, on Edge 2 0  Control/stop worrying 1 2  Worry too much - different things 1 2  Trouble relaxing 2 1  Restless 2 1  Easily annoyed or irritable 2 2  Afraid - awful might happen 0 0  Total GAD 7 Score 10 8  Anxiety Difficulty Very difficult Somewhat difficult        Past medical history, surgical history, and family history reviewed.  Current medication list and allergy/intolerance information reviewed.   (See remainder of HPI, ROS, Phys Exam below)    ASSESSMENT/PLAN:   Depression with anxiety - Plan: escitalopram (LEXAPRO) 10 MG tablet  Needs flu shot - Plan: Flu Vaccine QUAD 6+ mos PF IM (Fluarix Quad PF)   Meds ordered this  encounter  Medications  . escitalopram (LEXAPRO) 10 MG tablet    Sig: Take 1.5 tablets (15 mg total) by mouth at bedtime.    Dispense:  90 tablet    Refill:  1  . ALPRAZolam (XANAX) 0.5 MG tablet    Sig: Take 0.5-10 tablets (0.25-5 mg total) by mouth 2 (two) times daily as needed for anxiety.    Dispense:  15 tablet    Refill:  0    Patient Instructions  Will try going up to 1.5 tablets of Lexapro for 2 weeks or so. If doing okay, can continue, or can increase to 2 tablets daily. Let me know how it's going!   Xanax if needed.    Follow-up plan: Return in about 6 weeks (around 01/12/2018) for recheck on Lexapro, sooner if needed! .                         ############################################ ############################################ ############################################ ############################################    Outpatient Encounter Medications as of 12/01/2017  Medication Sig  . acetaminophen (TYLENOL) 500 MG tablet Take OTC as directed   . b complex vitamins tablet Take 1 tablet by mouth daily.  Marland Kitchen BIOTIN PO Take 1 tablet by mouth daily.   Marland Kitchen BLACK COHOSH HOT FLASH RELIEF PO Take by mouth.  . Cyanocobalamin (B-12) 1000 MCG SUBL Place under the tongue daily.   Marland Kitchen escitalopram (LEXAPRO) 10  MG tablet Take 1 tablet (10 mg total) by mouth daily.  . Estradiol-Norethindrone Acet 0.5-0.1 MG tablet Take 1 tablet by mouth daily.  . fluticasone (FLONASE) 50 MCG/ACT nasal spray USE 2 SPRAYS IN EACH NOSTRIL DAILY AS NEEDED  . levothyroxine (SYNTHROID, LEVOTHROID) 25 MCG tablet Take 1 tablet (25 mcg total) by mouth daily before breakfast.  . Multiple Vitamin (MULTIVITAMIN) tablet Take 1 tablet by mouth daily.    . SUMAtriptan (IMITREX) 50 MG tablet Take 1 tablet (50 mg total) by mouth daily as needed for migraine. Take one by mouth times one as needed for migraine- max one pill in a day.   No facility-administered encounter medications on file as  of 12/01/2017.    No Known Allergies    Review of Systems:  Constitutional: No recent illness  Cardiac: No  chest pain, No  pressure, No palpitations  Respiratory:  No  shortness of breath. No  Cough  Neurologic: No  weakness, No  Dizziness  Psychiatric: No  concerns with depression, +concerns with anxiety  Exam:  BP (!) 102/50   Pulse 74   Wt 131 lb 3.2 oz (59.5 kg)   BMI 24.00 kg/m   Constitutional: VS see above. General Appearance: alert, well-developed, well-nourished, NAD  Neck: No masses, trachea midline.   Respiratory: Normal respiratory effort.   Musculoskeletal: Gait normal. Symmetric and independent movement of all extremities  Neurological: Normal balance/coordination. No tremor.  Skin: warm, dry, intact.   Psychiatric: Normal judgment/insight. Normal mood and affect. Oriented x3. No SI/HI  Visit summary with medication list and pertinent instructions was printed for patient to review, advised to alert [place] if any changes needed. All questions at time of visit were answered - patient instructed to contact office with any additional concerns. ER/RTC precautions were reviewed with the patient and understanding verbalized.   Follow-up plan: Return in about 6 weeks (around 01/12/2018) for recheck on Lexapro, sooner if needed! .    Please note: voice recognition software was used to produce this document, and typos may escape review. Please contact Dr. Sheppard Coil for any needed clarifications.

## 2017-12-01 NOTE — Progress Notes (Signed)
Records requested from [facility] for pap, mammo, and stool cards.

## 2017-12-11 ENCOUNTER — Encounter: Payer: Self-pay | Admitting: Osteopathic Medicine

## 2017-12-11 DIAGNOSIS — F418 Other specified anxiety disorders: Secondary | ICD-10-CM

## 2017-12-11 MED ORDER — ESCITALOPRAM OXALATE 20 MG PO TABS: 20.0000 mg | ORAL_TABLET | Freq: Every day | ORAL | 1 refills | Status: DC

## 2017-12-14 LAB — HM MAMMOGRAPHY: HM Mammogram: NORMAL (ref 0–4)

## 2017-12-14 LAB — RESULTS CONSOLE HPV: CHL HPV: POSITIVE

## 2017-12-27 ENCOUNTER — Encounter: Payer: Self-pay | Admitting: Osteopathic Medicine

## 2017-12-28 NOTE — Telephone Encounter (Signed)
Pt concerned about cyst on hand. Can't help w/o appt, recommend she see Dr T for this as he saw her when she was in the office for this. Can we schedule follow up w him? Thanks

## 2018-01-04 ENCOUNTER — Encounter: Payer: Self-pay | Admitting: Osteopathic Medicine

## 2018-01-05 ENCOUNTER — Ambulatory Visit (INDEPENDENT_AMBULATORY_CARE_PROVIDER_SITE_OTHER): Payer: 59 | Admitting: Internal Medicine

## 2018-01-05 ENCOUNTER — Encounter: Payer: Self-pay | Admitting: Internal Medicine

## 2018-01-05 VITALS — BP 110/60 | HR 72 | Ht 62.0 in | Wt 128.0 lb

## 2018-01-05 DIAGNOSIS — E038 Other specified hypothyroidism: Secondary | ICD-10-CM

## 2018-01-05 DIAGNOSIS — E063 Autoimmune thyroiditis: Secondary | ICD-10-CM | POA: Diagnosis not present

## 2018-01-05 LAB — TSH: TSH: 4.17 u[IU]/mL (ref 0.35–4.50)

## 2018-01-05 LAB — T4, FREE: Free T4: 0.94 ng/dL (ref 0.60–1.60)

## 2018-01-05 NOTE — Progress Notes (Signed)
Patient ID: Kara Cooper, female   DOB: 12/19/1965, 52 y.o.   MRN: [id number]    HPI  Kara Cooper is a 52 y.o.-year-old female, returning for follow-up for hypothyroidism, diagnosed as Hashimoto's thyroiditis at last visit.  Last visit 6 months ago.  Pt. has been dx with hypothyroidism in 2015 (but had an elevated TSH in 2009 at her visit with OB/GYN) >> started on Levothyroxine 25 mcg 05/2017.  She does not feel much different after she started it.  Pt is on levothyroxine 25 mcg daily, taken: - in am - fasting - at least 30 min from b'fast - no Ca, Fe, PPIs - + MVI later in the day - stopped  Biotin At last visit, she was taking the levothyroxine with coffee and creamer and I advised her to move this 30 minutes later.  She was also taking multivitamins 1 hour after levothyroxine and I advised her to move it 4 hours later.  Reviewed patient's TFTs: Lab Results  Component Value Date   TSH 3.11 11/13/2017   TSH 5.34 (H) 05/19/2017   TSH 5.40 12/19/2012   TSH 3.74 01/03/2011   TSH 4.28 09/03/2010   TSH 4.05 06/19/2008   FREET4 0.68 11/13/2017   FREET4 0.9 05/19/2017  No results found for: T3FREE   Pt denies: - feeling nodules in neck - hoarseness - dysphagia - choking - SOB with lying down  She has + FH of thyroid disorders.No FH of thyroid cancer. No h/o radiation tx to head or neck.  No herbal supplements. No Biotin use. No recent steroids use.   Pt. also has a history of migraines, fibroids-status post fibroidectomy 2017, skin cancer.    She is exercising 2-3 times a week: Cardio, cycling, walking, weights  Started lexapro. She stopped HRT since last visit as she was having breakthrough bleeding and HAs. Lexapro helps.  She bought a house and moved 4 weeks ago..  ROS: Constitutional: no weight gain/no weight loss, + fatigue, + subjective hyperthermia, no subjective hypothermia, + nocturia x2-3 Eyes: no blurry vision, no xerophthalmia ENT: no sore throat, + see  HPI Cardiovascular: no CP/no SOB/no palpitations/no leg swelling Respiratory: no cough/no SOB/no wheezing Gastrointestinal: no N/no V/no D/no C/no acid reflux Musculoskeletal: no muscle aches/no joint aches Skin: no rashes, + hair loss Neurological: no tremors/no numbness/no tingling/no dizziness  I reviewed pt's medications, allergies, PMH, social hx, family hx, and changes were documented in the history of present illness. Otherwise, unchanged from my initial visit note.  Past Medical History:  Diagnosis Date  . Acute right pyelonephritis 04/04/2017  . Anemia    in past  . Basal cell carcinoma    treated with Moh's procedure  . Chicken pox    in past  . Headache(784.0)    frequent  . History of seasonal allergies   . Irregular menses   . Migraine   . Other and unspecified ovarian cysts    come and go  . Postural orthostatic tachycardia syndrome    Dr. Burt Knack  . UTI (lower urinary tract infection)    history of   Past Surgical History:  Procedure Laterality Date  . CERVICAL CONIZATION W/BX  1987, 1988  . DILATION AND CURETTAGE OF UTERUS     Social History   Socioeconomic History  . Marital status: Legally Separated    Spouse name: Not on file  . Number of children: 2  . Years of education: Not on file  . Highest education level: Not on file  Occupational History  . Occupation: Therapist, sports  Social Needs  . Financial resource strain: Not on file  . Food insecurity:    Worry: Not on file    Inability: Not on file  . Transportation needs:    Medical: Not on file    Non-medical: Not on file  Tobacco Use  . Smoking status: Never Smoker  . Smokeless tobacco: Never Used  Substance and Sexual Activity  . Alcohol use: Yes    Comment: Occasional  . Drug use: No  . Sexual activity: Not on file  Lifestyle  . Physical activity:    Days per week: Not on file    Minutes per session: Not on file  . Stress: Not on file  Relationships  . Social connections:    Talks on  phone: Not on file    Gets together: Not on file    Attends religious service: Not on file    Active member of club or organization: Not on file    Attends meetings of clubs or organizations: Not on file    Relationship status: Not on file  . Intimate partner violence:    Fear of current or ex partner: Not on file    Emotionally abused: Not on file    Physically abused: Not on file    Forced sexual activity: Not on file  Other Topics Concern  . Not on file  Social History Narrative   Student.  Exercises every day, also cycles.   Current Outpatient Medications on File Prior to Visit  Medication Sig Dispense Refill  . acetaminophen (TYLENOL) 500 MG tablet Take OTC as directed     . ALPRAZolam (XANAX) 0.5 MG tablet Take 0.5-10 tablets (0.25-5 mg total) by mouth 2 (two) times daily as needed for anxiety. 15 tablet 0  . b complex vitamins tablet Take 1 tablet by mouth daily.    Marland Kitchen BIOTIN PO Take 1 tablet by mouth daily.     . Cyanocobalamin (B-12) 1000 MCG SUBL Place under the tongue daily.     Marland Kitchen escitalopram (LEXAPRO) 20 MG tablet Take 1 tablet (20 mg total) by mouth at bedtime. 90 tablet 1  . Estradiol-Norethindrone Acet 0.5-0.1 MG tablet Take 1 tablet by mouth daily.  5  . fluticasone (FLONASE) 50 MCG/ACT nasal spray USE 2 SPRAYS IN EACH NOSTRIL DAILY AS NEEDED 16 g 11  . levothyroxine (SYNTHROID, LEVOTHROID) 25 MCG tablet Take 1 tablet (25 mcg total) by mouth daily before breakfast. 90 tablet 3  . Multiple Vitamin (MULTIVITAMIN) tablet Take 1 tablet by mouth daily.      . SUMAtriptan (IMITREX) 50 MG tablet Take 1 tablet (50 mg total) by mouth daily as needed for migraine. Take one by mouth times one as needed for migraine- max one pill in a day. 10 tablet 5   No current facility-administered medications on file prior to visit.    No Known Allergies Family History  Problem Relation Age of Onset  . Hyperlipidemia Mother   . Heart disease Mother        A-fib, ablation  . Migraines  Daughter   . Diabetes Daughter        type I  . Depression Daughter        bipolar    PE: BP 110/60   Pulse 72   Ht 5\' 2"  (1.575 m)   Wt 128 lb (58.1 kg)   BMI 23.41 kg/m  Wt Readings from Last 3 Encounters:  01/05/18 128 lb (58.1 kg)  12/01/17 131 lb 3.2 oz (59.5 kg)  10/25/17 128 lb 1.6 oz (58.1 kg)   Constitutional: Normal weight, in NAD Eyes: PERRLA, EOMI, no exophthalmos ENT: moist mucous membranes, no thyromegaly, + R upper cervical lymphadenopathy - 2 enlarged LNs - after allergies/congestion Cardiovascular: RRR, No MRG Respiratory: CTA B Gastrointestinal: abdomen soft, NT, ND, BS+ Musculoskeletal: no deformities, strength intact in all 4 Skin: moist, warm, no rashes Neurological: no tremor with outstretched hands, DTR normal in all 4  ASSESSMENT: 1.  Hashimoto's hypothyroidism  PLAN:  1. Patient with few years of very mild hypothyroidism, now on low-dose levothyroxine, 25 mcg, starting this year.  She did not feel much different after she started the medication. - She continues to complain of fatigue, hair loss, heat intolerance - No neck compression symptoms - At last visit, we diagnosed Hashimoto's thyroiditis based on elevated thyroid antibodies.  I did suggest selenium which can help with decreasing the antibodies. - At this visit, we discussed more about the Hashimoto's thyroiditis, the fact that this is an autoimmune condition, fueled by stress - latest thyroid labs reviewed with pt >> normal in 10/2017, with TSH on the higher end of the interval - she continues on LT4 25 mcg daily - pt feels good on this dose. - we discussed about taking the thyroid hormone every day, with water, >30 minutes before breakfast, separated by >4 hours from acid reflux medications, calcium, iron, multivitamins. Pt. is taking it correctly. - will check thyroid tests today: TSH and fT4 and in 6 mo - If labs are abnormal, she will need to return for repeat TFTs in 1.5 months -  Otherwise, I will see her in a year  - time spent with the patient: 15 minutes, of which >50% was spent in obtaining information about her symptoms, reviewing her previous labs, evaluations, and treatments, counseling her about her condition (please see the discussed topics above), and developing a plan to further investigate and treat it; she had a number of questions which I addressed.  Needs refills.  Component     Latest Ref Rng & Units 01/05/2018  TSH     0.35 - 4.50 uIU/mL 4.17  T4,Free(Direct)     0.60 - 1.60 ng/dL 0.94   TSH is at the upper limit of normal.  We will increase levothyroxine to 50 mcg daily and check TFTs in 1.5 months.   Philemon Kingdom, MD PhD [facility]

## 2018-01-05 NOTE — Patient Instructions (Signed)
Please stop at the lab.  Please continue Levothyroxine 25 mcg daily.  Take the thyroid hormone every day, with water, at least 30 minutes before breakfast, separated by at least 4 hours from: - acid reflux medications - calcium - iron - multivitamins  Please come back for a follow-up appointment in 1 year, but labs in 6 months.

## 2018-01-08 MED ORDER — LEVOTHYROXINE SODIUM 50 MCG PO TABS: 25.0000 ug | ORAL_TABLET | Freq: Every day | ORAL | 3 refills | Status: DC

## 2018-01-16 ENCOUNTER — Ambulatory Visit: Payer: 59 | Admitting: Sports Medicine

## 2018-01-18 ENCOUNTER — Ambulatory Visit (INDEPENDENT_AMBULATORY_CARE_PROVIDER_SITE_OTHER): Payer: 59 | Admitting: Sports Medicine

## 2018-01-18 ENCOUNTER — Encounter: Payer: Self-pay | Admitting: Sports Medicine

## 2018-01-18 DIAGNOSIS — M25542 Pain in joints of left hand: Secondary | ICD-10-CM

## 2018-01-18 DIAGNOSIS — S52045G Nondisplaced fracture of coronoid process of left ulna, subsequent encounter for closed fracture with delayed healing: Secondary | ICD-10-CM

## 2018-01-18 DIAGNOSIS — M25541 Pain in joints of right hand: Secondary | ICD-10-CM

## 2018-01-18 MED ORDER — CELECOXIB 200 MG PO CAPS: ORAL_CAPSULE | ORAL | 2 refills | Status: DC

## 2018-01-18 NOTE — Assessment & Plan Note (Signed)
With Bouchard and Heberden nodes and DIP mucoid cysts. X-rays confirm small joint osteoarthritis. Adding a rheumatoid work-up, aunt and grandma have rheumatoid arthritis. Also adding Celebrex to be used 1 to 2 pills daily. Return to see me in 1 month.

## 2018-01-18 NOTE — Assessment & Plan Note (Signed)
Symptoms completely resolved after injection approximately 10 months ago. Only has a bit of discomfort and stiffness, lack of full extension, only bothers her with weight lifting. I have advised her that we can do another injection should this interfere with her activities enough to warrant the injection itself. She will think about it and let me know.

## 2018-01-18 NOTE — Progress Notes (Signed)
Subjective:    I'm seeing this patient as a consultation for: Dr. Emeterio Reeve  CC: Hand swelling  HPI: For years this pleasant 52 year old female has had swelling in the distal interphalangeal joints of her fingers, occasionally she gets small cysts that tend to burst and leak a mucous type fluid.  She has minimal pain, morning stiffness.  No trauma, she does have a family history of rheumatoid arthritis in her grandmother and her aunt.  Comes are mild, intermittent.  I reviewed the past medical history, family history, social history, surgical history, and allergies today and no changes were needed.  Please see the problem list section below in epic for further details.  Past Medical History: Past Medical History:  Diagnosis Date  . Acute right pyelonephritis 04/04/2017  . Anemia    in past  . Basal cell carcinoma    treated with Moh's procedure  . Chicken pox    in past  . Headache(784.0)    frequent  . History of seasonal allergies   . Irregular menses   . Migraine   . Other and unspecified ovarian cysts    come and go  . Postural orthostatic tachycardia syndrome    Dr. Burt Knack  . UTI (lower urinary tract infection)    history of   Past Surgical History: Past Surgical History:  Procedure Laterality Date  . CERVICAL CONIZATION W/BX  1987, 1988  . DILATION AND CURETTAGE OF UTERUS     Social History: Social History   Socioeconomic History  . Marital status: Legally Separated    Spouse name: Not on file  . Number of children: 2  . Years of education: Not on file  . Highest education level: Not on file  Occupational History  . Occupation: Therapist, sports  Social Needs  . Financial resource strain: Not on file  . Food insecurity:    Worry: Not on file    Inability: Not on file  . Transportation needs:    Medical: Not on file    Non-medical: Not on file  Tobacco Use  . Smoking status: Never Smoker  . Smokeless tobacco: Never Used  Substance and Sexual Activity  .  Alcohol use: Yes    Comment: Occasional  . Drug use: No  . Sexual activity: Not on file  Lifestyle  . Physical activity:    Days per week: Not on file    Minutes per session: Not on file  . Stress: Not on file  Relationships  . Social connections:    Talks on phone: Not on file    Gets together: Not on file    Attends religious service: Not on file    Active member of club or organization: Not on file    Attends meetings of clubs or organizations: Not on file    Relationship status: Not on file  Other Topics Concern  . Not on file  Social History Narrative   Student.  Exercises every day, also cycles.   Family History: Family History  Problem Relation Age of Onset  . Hyperlipidemia Mother   . Heart disease Mother        A-fib, ablation  . Migraines Daughter   . Diabetes Daughter        type I  . Depression Daughter        bipolar   Allergies: No Known Allergies Medications: See med rec.  Review of Systems: No headache, visual changes, nausea, vomiting, diarrhea, constipation, dizziness, abdominal pain, skin rash, fevers, chills,  night sweats, weight loss, swollen lymph nodes, body aches, joint swelling, muscle aches, chest pain, shortness of breath, mood changes, visual or auditory hallucinations.   Objective:   General: Well Developed, well nourished, and in no acute distress.  Neuro:  Extra-ocular muscles intact, able to move all 4 extremities, sensation grossly intact.  Deep tendon reflexes tested were normal. Psych: Alert and oriented, mood congruent with affect. ENT:  Ears and nose appear unremarkable.  Hearing grossly normal. Neck: Unremarkable overall appearance, trachea midline.  No visible thyroid enlargement. Eyes: Conjunctivae and lids appear unremarkable.  Pupils equal and round. Skin: Warm and dry, no rashes noted.  Cardiovascular: Pulses palpable, no extremity edema. Hands: Bilateral Bouchard and Heberden nodes, there are a few mucoid cysts, 1 of which  appears to have burst. Has on acrylic nails but tells me there is grooving in her natural nails underneath.  X-rays reviewed, there is DIP and PIP osteoarthritis.  Impression and Recommendations:   This case required medical decision making of moderate complexity.  Arthralgia of both hands With Bouchard and Heberden nodes and DIP mucoid cysts. X-rays confirm small joint osteoarthritis. Adding a rheumatoid work-up, aunt and grandma have rheumatoid arthritis. Also adding Celebrex to be used 1 to 2 pills daily. Return to see me in 1 month.  Fracture of coronoid process of ulna, left, closed Symptoms completely resolved after injection approximately 10 months ago. Only has a bit of discomfort and stiffness, lack of full extension, only bothers her with weight lifting. I have advised her that we can do another injection should this interfere with her activities enough to warrant the injection itself. She will think about it and let me know. ___________________________________________ Gwen Her. Dianah Field, M.D., ABFM., CAQSM. Primary Care and [place] Instructor of [employer] [facility]

## 2018-01-19 ENCOUNTER — Encounter: Payer: Self-pay | Admitting: Osteopathic Medicine

## 2018-01-24 ENCOUNTER — Encounter: Payer: Self-pay | Admitting: Sports Medicine

## 2018-01-25 LAB — RHEUMATOID ARTHRITIS DIAGNOSTIC PANEL, COMPREHENSIVE
Cyclic Citrullin Peptide Ab: <16 Units (ref <20)
Rheumatoid Factor (IgA): <5 U (ref <=6)
Rheumatoid Factor (IgG): 6 U (ref <=6)
Rheumatoid Factor (IgM): 6 U (ref <=6)
SSA (Ro) (ENA) Antibody, IgG: <1 AI
SSB (La) (ENA) Antibody, IgG: <1 AI

## 2018-01-25 LAB — ANA, IFA COMPREHENSIVE PANEL
Anti Nuclear Antibody(ANA): NEGATIVE
ENA SM Ab Ser-aCnc: <1 AI
SM/RNP: <1 AI
SSA (Ro) (ENA) Antibody, IgG: <1 AI
SSB (La) (ENA) Antibody, IgG: <1 AI
Scleroderma (Scl-70) (ENA) Antibody, IgG: <1 AI
ds DNA Ab: <1 IU/mL

## 2018-01-25 LAB — SEDIMENTATION RATE: Sed Rate: 2 mm/h (ref 0–20)

## 2018-01-25 LAB — URIC ACID: Uric Acid, Serum: 3.8 mg/dL (ref 2.5–7.0)

## 2018-02-15 ENCOUNTER — Ambulatory Visit: Payer: 59 | Admitting: Sports Medicine

## 2018-02-22 ENCOUNTER — Encounter: Payer: Self-pay | Admitting: Osteopathic Medicine

## 2018-02-22 ENCOUNTER — Ambulatory Visit (INDEPENDENT_AMBULATORY_CARE_PROVIDER_SITE_OTHER): Payer: 59 | Admitting: Osteopathic Medicine

## 2018-02-22 VITALS — BP 101/65 | HR 72 | Temp 98.1°F | Wt 135.7 lb

## 2018-02-22 DIAGNOSIS — F418 Other specified anxiety disorders: Secondary | ICD-10-CM
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
## 2018-03-24 ENCOUNTER — Encounter: Payer: Self-pay | Admitting: Internal Medicine

## 2018-03-26 NOTE — Telephone Encounter (Signed)
In the last results note:  Viewed by Candis Shine on 01/20/2018 8:22 AM  Written by Philemon Kingdom, MD on 01/08/2018 10:05 AM   Dear Ms. Iovino,  The TSH is normal, but is at the upper limit of normal. I will suggest to increase levothyroxine to 50 mcg daily. I sent a new prescription for this dose to your pharmacy. Let's have you back for repeat labs in 1.5 months.  Please call our main office number [phone]) to schedule a lab appointment.  Sincerely,  Philemon Kingdom MD

## 2018-03-27 ENCOUNTER — Other Ambulatory Visit: Payer: Self-pay

## 2018-03-27 MED ORDER — LEVOTHYROXINE SODIUM 50 MCG PO TABS: ORAL_TABLET | ORAL | 3 refills | Status: DC

## 2018-03-29 ENCOUNTER — Other Ambulatory Visit: Payer: Self-pay | Admitting: Sports Medicine

## 2018-03-29 DIAGNOSIS — M25542 Pain in joints of left hand: Principal | ICD-10-CM

## 2018-03-29 DIAGNOSIS — M25541 Pain in joints of right hand: Secondary | ICD-10-CM

## 2018-03-29 MED ORDER — CELECOXIB 200 MG PO CAPS: ORAL_CAPSULE | ORAL | 3 refills | Status: DC

## 2018-04-19 ENCOUNTER — Encounter: Payer: Self-pay | Admitting: Osteopathic Medicine

## 2018-04-23 ENCOUNTER — Encounter: Payer: Self-pay | Admitting: Osteopathic Medicine

## 2018-04-23 ENCOUNTER — Ambulatory Visit (INDEPENDENT_AMBULATORY_CARE_PROVIDER_SITE_OTHER): Payer: 59 | Admitting: Osteopathic Medicine

## 2018-04-23 VITALS — BP 98/62 | HR 67 | Temp 97.6°F | Wt 136.4 lb

## 2018-04-23 DIAGNOSIS — E063 Autoimmune thyroiditis: Secondary | ICD-10-CM | POA: Diagnosis not present

## 2018-04-23 DIAGNOSIS — Z0000 Encounter for general adult medical examination without abnormal findings: Secondary | ICD-10-CM

## 2018-04-23 DIAGNOSIS — R399 Unspecified symptoms and signs involving the genitourinary system: Secondary | ICD-10-CM | POA: Diagnosis not present

## 2018-04-23 DIAGNOSIS — E038 Other specified hypothyroidism: Secondary | ICD-10-CM

## 2018-04-23 LAB — POCT URINALYSIS DIPSTICK
BILIRUBIN UA: NEGATIVE
Blood, UA: NEGATIVE
GLUCOSE UA: NEGATIVE
KETONES UA: NEGATIVE
Leukocytes, UA: NEGATIVE
Nitrite, UA: NEGATIVE
Protein, UA: NEGATIVE
Spec Grav, UA: 1.02 (ref 1.010–1.025)
UROBILINOGEN UA: 0.2 U/dL
pH, UA: 7 (ref 5.0–8.0)

## 2018-04-23 NOTE — Progress Notes (Signed)
HPI: Kara Cooper is a 53 y.o. female who  has a past medical history of Acute right pyelonephritis (04/04/2017), Anemia, Basal cell carcinoma, Chicken pox, Headache(784.0), History of seasonal allergies, Irregular menses, Migraine, Other and unspecified ovarian cysts, Postural orthostatic tachycardia syndrome, and UTI (lower urinary tract infection).  she presents to [facility] today, 04/23/18,  for chief complaint of:  UTI concern  About 2 weeks of increased urgency, frequency, flank pain / R lower back pain, foul odor to the urine smells more of ammonia. UA results as below, nothing on POC testing. No previous urine cultures on file. Hx pyleo. Feeling of bladder fullness, relieved w/ micturition, no dysuria or hematuria, no fever or abdominal pain. Has been increasing her hydraiton.      At today's visit... Past medical history, surgical history, and family history reviewed and updated as needed.  Current medication list and allergy/intolerance information reviewed and updated as needed. (See remainder of HPI, ROS, Phys Exam below)     Results for orders placed or performed in visit on 04/23/18 (from the past 72 hour(s))  POCT Urinalysis Dipstick     Status: None   Collection Time: 04/23/18 10:12 AM  Result Value Ref Range   Color, UA YELLOW    Clarity, UA CLEAR    Glucose, UA Negative Negative   Bilirubin, UA NEGATIVE    Ketones, UA NEGATIVE    Spec Grav, UA 1.020 1.010 - 1.025   Blood, UA NEGATIVE    pH, UA 7.0 5.0 - 8.0   Protein, UA Negative Negative   Urobilinogen, UA 0.2 0.2 or 1.0 E.U./dL   Nitrite, UA NEGATIVE    Leukocytes, UA Negative Negative   Appearance     Odor            ASSESSMENT/PLAN: The encounter diagnosis was UTI symptoms.  No UTI obvious on POC, no dysuria, "flankl pain" more LBP/hip, defer abx for now unless (+)Ucx, consider detrol or myrbetriq   Labs ordered for future visit, annual physical not  performed or billed today.    Orders Placed This Encounter  Procedures  . Urine Culture  . Urinalysis, microscopic only  . POCT Urinalysis Dipstick     No orders of the defined types were placed in this encounter.   Patient Instructions  Consider medications for spasm Will await urine culture results - let us know if you haven't heard back by Thursday Call/message me is anything changes or gets worse      Follow-up plan: Return for  [facility] PHYSICAL 4-6 weeks, or sooner if needed .                             ############################################ ############################################ ############################################ ############################################    Current Meds  Medication Sig  . acetaminophen (TYLENOL) 500 MG tablet Take OTC as directed   . b complex vitamins tablet Take 1 tablet by mouth daily.  . celecoxib (CELEBREX) 200 MG capsule One to 2 tablets by mouth daily as needed for pain.  . Cyanocobalamin (B-12) 1000 MCG SUBL Place under the tongue daily.   . fluticasone (FLONASE) 50 MCG/ACT nasal spray USE 2 SPRAYS IN EACH NOSTRIL DAILY AS NEEDED  . levothyroxine (SYNTHROID, LEVOTHROID) 50 MCG tablet Take 1 tablet daily before breakfast  . Multiple Vitamin (MULTIVITAMIN) tablet Take 1 tablet by mouth daily.    . SUMAtriptan (IMITREX) 50 MG tablet Take 1 tablet (50 mg total) by mouth  daily as needed for migraine. Take one by mouth times one as needed for migraine- max one pill in a day.    No Known Allergies     Review of Systems:  Constitutional: No recent illness  HEENT: No  headache, no vision change  Cardiac: No  chest pain, No  pressure, No palpitations  Respiratory:  No  shortness of breath. No  Cough  Gastrointestinal: No  abdominal pain  Musculoskeletal: +new myalgia/arthralgia R lower back pain   Skin: No  Rash  GU: feeling bladder fullness, see HPI  Neurologic: No  weakness, No   Dizziness   Exam:  BP 98/62 (BP Location: Left Arm, Patient Position: Sitting, Cuff Size: Normal)   Pulse 67   Temp 97.6 F (36.4 C) (Oral)   Wt 136 lb 6.4 oz (61.9 kg)   BMI 24.95 kg/m   Constitutional: VS see above. General Appearance: alert, well-developed, well-nourished, NAD  Eyes: Normal lids and conjunctive, non-icteric sclera  Ears, Nose, Mouth, Throat: MMM, Normal external inspection ears/nares/mouth/lips/gums.  Neck: No masses, trachea midline.   Respiratory: Normal respiratory effort. no wheeze, no rhonchi, no rales  Cardiovascular: S1/S2 normal, no murmur, no rub/gallop auscultated. RRR.   Musculoskeletal: Gait normal. Symmetric and independent movement of all extremities. Neg [place] bilaterally.   Abdominal: non-tender, non-distended, no appreciable organomegaly, neg Murphy's, BS WNLx4  Neurological: Normal balance/coordination. No tremor.  Skin: warm, dry, intact.   Psychiatric: Normal judgment/insight. Normal mood and affect. Oriented x3.       Visit summary with medication list and pertinent instructions was printed for patient to review, patient was advised to alert [place] if any updates are needed. All questions at time of visit were answered - patient instructed to contact office with any additional concerns. ER/RTC precautions were reviewed with the patient and understanding verbalized.     Please note: voice recognition software was used to produce this document, and typos may escape review. Please contact Dr. Sheppard Coil for any needed clarifications.    Follow up plan: Return for  ANNUAL PHYSICAL 4-6 weeks, or sooner if needed .

## 2018-04-23 NOTE — Patient Instructions (Signed)
Consider medications for spasm Will await urine culture results - let us know if you haven't heard back by Thursday Call/message me is anything changes or gets worse

## 2018-04-26 LAB — URINE CULTURE
MICRO NUMBER:: 18593
SPECIMEN QUALITY: ADEQUATE

## 2018-04-26 LAB — URINALYSIS, MICROSCOPIC ONLY
Bacteria, UA: NONE SEEN /HPF
Hyaline Cast: NONE SEEN /LPF
RBC / HPF: NONE SEEN /HPF (ref 0–2)
SQUAMOUS EPITHELIAL / LPF: NONE SEEN /HPF (ref <=5)
WBC UA: NONE SEEN /HPF (ref 0–5)

## 2018-04-26 MED ORDER — SULFAMETHOXAZOLE-TRIMETHOPRIM 800-160 MG PO TABS: 1.0000 | ORAL_TABLET | Freq: Two times a day (BID) | ORAL | 0 refills | Status: AC

## 2018-04-26 NOTE — Addendum Note (Signed)
Addended by: Maryla Morrow on: 04/26/2018 04:43 PM   Modules accepted: Orders

## 2018-05-16 ENCOUNTER — Other Ambulatory Visit: Payer: Self-pay | Admitting: Osteopathic Medicine

## 2018-05-16 ENCOUNTER — Encounter: Payer: Self-pay | Admitting: Internal Medicine

## 2018-05-16 DIAGNOSIS — E063 Autoimmune thyroiditis: Principal | ICD-10-CM

## 2018-05-16 DIAGNOSIS — E038 Other specified hypothyroidism: Secondary | ICD-10-CM

## 2018-05-16 LAB — LIPID PANEL
Cholesterol: 194 mg/dL (ref <200)
HDL: 61 mg/dL (ref >50)
LDL Cholesterol (Calc): 117 mg/dL (calc) — ABNORMAL HIGH
Non-HDL Cholesterol (Calc): 133 mg/dL (calc) — ABNORMAL HIGH (ref <130)
Total CHOL/HDL Ratio: 3.2 (calc) (ref <5.0)
Triglycerides: 64 mg/dL (ref <150)

## 2018-05-16 LAB — COMPLETE METABOLIC PANEL WITH GFR
AG Ratio: 1.7 (calc) (ref 1.0–2.5)
ALBUMIN MSPROF: 4.3 g/dL (ref 3.6–5.1)
ALT: 24 U/L (ref 6–29)
AST: 22 U/L (ref 10–35)
Alkaline phosphatase (APISO): 46 U/L (ref 33–130)
BUN: 14 mg/dL (ref 7–25)
CO2: 29 mmol/L (ref 20–32)
Calcium: 9.4 mg/dL (ref 8.6–10.4)
Chloride: 107 mmol/L (ref 98–110)
Creat: 0.82 mg/dL (ref 0.50–1.05)
GFR, Est African American: 96 mL/min/{1.73_m2} (ref >=60)
GFR, Est Non African American: 83 mL/min/{1.73_m2} (ref >=60)
Globulin: 2.6 g/dL (calc) (ref 1.9–3.7)
Glucose, Bld: 84 mg/dL (ref 65–99)
Potassium: 4.2 mmol/L (ref 3.5–5.3)
Sodium: 143 mmol/L (ref 135–146)
Total Bilirubin: 0.4 mg/dL (ref 0.2–1.2)
Total Protein: 6.9 g/dL (ref 6.1–8.1)

## 2018-05-16 LAB — TSH: TSH: 5.45 mIU/L — ABNORMAL HIGH

## 2018-05-16 LAB — CBC
HCT: 39.2 % (ref 35.0–45.0)
Hemoglobin: 13.4 g/dL (ref 11.7–15.5)
MCH: 32.1 pg (ref 27.0–33.0)
MCHC: 34.2 g/dL (ref 32.0–36.0)
MCV: 94 fL (ref 80.0–100.0)
MPV: 11.5 fL (ref 7.5–12.5)
Platelets: 216 10*3/uL (ref 140–400)
RBC: 4.17 10*6/uL (ref 3.80–5.10)
RDW: 11.7 % (ref 11.0–15.0)
WBC: 3.8 10*3/uL (ref 3.8–10.8)

## 2018-05-16 MED ORDER — LEVOTHYROXINE SODIUM 75 MCG PO TABS: 75.0000 ug | ORAL_TABLET | Freq: Every day | ORAL | 0 refills | Status: DC

## 2018-05-22 ENCOUNTER — Encounter: Payer: Self-pay | Admitting: Osteopathic Medicine

## 2018-05-22 ENCOUNTER — Ambulatory Visit (INDEPENDENT_AMBULATORY_CARE_PROVIDER_SITE_OTHER): Payer: 59 | Admitting: Osteopathic Medicine

## 2018-05-22 VITALS — BP 103/69 | HR 76 | Temp 98.4°F | Wt 138.2 lb

## 2018-05-22 DIAGNOSIS — Z0000 Encounter for general adult medical examination without abnormal findings: Secondary | ICD-10-CM

## 2018-05-22 DIAGNOSIS — M797 Fibromyalgia: Secondary | ICD-10-CM

## 2018-05-22 DIAGNOSIS — R5382 Chronic fatigue, unspecified: Secondary | ICD-10-CM | POA: Diagnosis not present

## 2018-05-22 MED ORDER — AMITRIPTYLINE HCL 25 MG PO TABS: 25.0000 mg | ORAL_TABLET | Freq: Every day | ORAL | 1 refills | Status: DC

## 2018-05-22 NOTE — Patient Instructions (Addendum)
General Preventive Care  Most recent routine screening lipids/other labs:   Everyone should have blood pressure checked once per year.   Tobacco: don't!  Alcohol: responsible moderation is ok for most adults - if you have concerns about your alcohol intake, please talk to me!   Exercise: as tolerated to reduce risk of cardiovascular disease and diabetes. Strength training will also prevent osteoporosis.   Mental health: if need for mental health care (medicines, counseling, other), or concerns about moods, please let me know!   Sexual health: if need for STD testing, or if concerns with libido/pain problems, please let me know!  Advanced Directive: Living Will and/or Healthcare Power of Attorney recommended for all adults, regardless of age or health.  Vaccines  Flu vaccine: recommended for almost everyone, every fall.   Shingles vaccine: Shingrix recommended after age 40. Can add you to our clinic waiting list or can contact your pharmacy.   Pneumonia vaccines: Prevnar and Pneumovax recommended after age 3, or sooner if certain medical conditions.  Tetanus booster: Tdap recommended every 10 years. Done 12/2012 Cancer screenings   Colon cancer screening: recommended for everyone at age 83, but some folks need a colonoscopy sooner if risk factors   Breast cancer screening: mammogram recommended at age 42 every other year at least, and annually after age 35.   Cervical cancer screening: Pap every 1 to 5 years depending on age and other risk factors. Can usually stop at age 35 or w/ hysterectomy.  Lung cancer screening: not needed for non-smokers  Infection screenings . HIV: recommended screening at least once age 102-65, more often as needed. . Gonorrhea/Chlamydia: screening as needed. . Hepatitis C: recommended for anyone born 67-1963 . TB: certain at-risk populations, or depending on work requirements and/or travel history Other . Bone Density Test: recommended for women at  age 83, sooner depending on risk factors

## 2018-05-22 NOTE — Progress Notes (Signed)
HPI: Kara Cooper is a 53 y.o. female who  has a past medical history of Acute right pyelonephritis (04/04/2017), Anemia, Basal cell carcinoma, Chicken pox, Headache(784.0), History of seasonal allergies, Irregular menses, Migraine, Other and unspecified ovarian cysts, Postural orthostatic tachycardia syndrome, and UTI (lower urinary tract infection).  she presents to [facility] today, 05/22/18,  for chief complaint of: Annual physical     Patient here for annual physical / wellness exam.  See preventive care reviewed as below.    Additional concerns today include:  Chronic fatigue and body aches for the past 6 months, all labs ok except TSH, inflammatory markers checked last year. Reports on and off pain, becoming more constant, she is going to the gym but feeling much more tired in general, body aches more than usual.      Past medical, surgical, social and family history reviewed:  Patient Active Problem List   Diagnosis Date Noted  . Arthralgia of both hands 01/18/2018  . Hypothyroidism due to Hashimoto's thyroiditis 07/05/2017  . Chronic fatigue 05/17/2017  . Fracture of coronoid process of ulna, left, closed 01/24/2017  . Stress reaction 09/27/2013  . Routine general medical examination at a health care facility 12/19/2012  . Syncope/dysautomia 01/03/2011  . ALLERGIC RHINITIS 12/29/2008  . MIGRAINE, CLASSICAL 06/19/2008    Past Surgical History:  Procedure Laterality Date  . CERVICAL CONIZATION W/BX  1987, 1988  . DILATION AND CURETTAGE OF UTERUS      Social History   Tobacco Use  . Smoking status: Never Smoker  . Smokeless tobacco: Never Used  Substance Use Topics  . Alcohol use: Yes    Comment: Occasional    Family History  Problem Relation Age of Onset  . Hyperlipidemia Mother   . Heart disease Mother        A-fib, ablation  . Migraines Daughter   . Diabetes Daughter        type I  . Depression Daughter    bipolar     Current medication list and allergy/intolerance information reviewed:    Current Outpatient Medications  Medication Sig Dispense Refill  . acetaminophen (TYLENOL) 500 MG tablet Take OTC as directed     . b complex vitamins tablet Take 1 tablet by mouth daily.    . celecoxib (CELEBREX) 200 MG capsule One to 2 tablets by mouth daily as needed for pain. 180 capsule 3  . Cyanocobalamin (B-12) 1000 MCG SUBL Place under the tongue daily.     . fluticasone (FLONASE) 50 MCG/ACT nasal spray USE 2 SPRAYS IN EACH NOSTRIL DAILY AS NEEDED 16 g 11  . levothyroxine (SYNTHROID, LEVOTHROID) 75 MCG tablet Take 1 tablet (75 mcg total) by mouth daily before breakfast. Will be due for lab work when pill bottle is running low 60 tablet 0  . Multiple Vitamin (MULTIVITAMIN) tablet Take 1 tablet by mouth daily.      . SUMAtriptan (IMITREX) 50 MG tablet Take 1 tablet (50 mg total) by mouth daily as needed for migraine. Take one by mouth times one as needed for migraine- max one pill in a day. 10 tablet 5  . amitriptyline (ELAVIL) 25 MG tablet Take 1-4 tablets (25-100 mg total) by mouth at bedtime. Increase by 1 tablet at a time once per week to max 100 mg 90 tablet 1   No current facility-administered medications for this visit.     No Known Allergies    Review of Systems:  Constitutional:  No  fever, no chills, No recent illness, No unintentional weight changes. +significant fatigue.   HEENT: No  headache, no vision change, no hearing change, No sore throat, No  sinus pressure  Cardiac: No  chest pain, No  pressure, No palpitations  Respiratory:  No  shortness of breath. No  Cough  Gastrointestinal: No  abdominal pain, No  nausea, No  vomiting,  No  blood in stool, No  diarrhea, No  constipation   Musculoskeletal: + generalized chronic myalgia/arthralgia  Skin: No  Rash, No other wounds/concerning lesions  Genitourinary: No  incontinence, No  abnormal genital bleeding, No abnormal genital  discharge  Hem/Onc: No  easy bruising/bleeding, No  abnormal lymph node  Endocrine: No cold intolerance,  No heat intolerance. No polyuria/polydipsia/polyphagia   Neurologic: No  weakness, No  dizziness, No  slurred speech/focal weakness/facial droop  Psychiatric: No  concerns with depression, No  concerns with anxiety, No sleep problems, No mood problems  Exam:  BP 103/69 (BP Location: Left Arm, Patient Position: Sitting, Cuff Size: Normal)   Pulse 76   Temp 98.4 F (36.9 C) (Oral)   Wt 138 lb 3.2 oz (62.7 kg)   BMI 25.28 kg/m   Constitutional: VS see above. General Appearance: alert, well-developed, well-nourished, NAD  Eyes: Normal lids and conjunctive, non-icteric sclera  Ears, Nose, Mouth, Throat: MMM, Normal external inspection ears/nares/mouth/lips/gums. TM normal bilaterally. Pharynx/tonsils no erythema, no exudate. Nasal mucosa normal.   Neck: No masses, trachea midline. No thyroid enlargement. No tenderness/mass appreciated. No lymphadenopathy  Respiratory: Normal respiratory effort. no wheeze, no rhonchi, no rales  Cardiovascular: S1/S2 normal, no murmur, no rub/gallop auscultated. RRR. No lower extremity edema.   Gastrointestinal: Nontender, no masses. No hepatomegaly, no splenomegaly. No hernia appreciated. Bowel sounds normal. Rectal exam deferred.   Musculoskeletal: Gait normal. No clubbing/cyanosis of digits.   Neurological: Normal balance/coordination. No tremor. No cranial nerve deficit on limited exam. Motor and sensation intact and symmetric. Cerebellar reflexes intact.   Skin: warm, dry, intact. No rash/ulcer. No concerning nevi or subq nodules on limited exam.    Psychiatric: Normal judgment/insight. Normal mood and affect. Oriented x3.       ASSESSMENT/PLAN: The primary encounter diagnosis was Annual physical exam. Diagnoses of Chronic fatigue and Fibromyalgia syndrome were also pertinent to this visit.   No strong consideration for mood / mental  health issue  More suspect for fibromyalgia syndrome or chronic fatigue syndrome  Will trial Elavil given insomnia issues, advised consistent use NSAID    Meds ordered this encounter  Medications  . amitriptyline (ELAVIL) 25 MG tablet    Sig: Take 1-4 tablets (25-100 mg total) by mouth at bedtime. Increase by 1 tablet at a time once per week to max 100 mg    Dispense:  90 tablet    Refill:  1    Patient Instructions  General Preventive Care  Most recent routine screening lipids/other labs:   Everyone should have blood pressure checked once per year.   Tobacco: don't!  Alcohol: responsible moderation is ok for most adults - if you have concerns about your alcohol intake, please talk to me!   Exercise: as tolerated to reduce risk of cardiovascular disease and diabetes. Strength training will also prevent osteoporosis.   Mental health: if need for mental health care (medicines, counseling, other), or concerns about moods, please let me know!   Sexual health: if need for STD testing, or if concerns with libido/pain problems, please let me know!  Advanced Directive: Living Will  and/or [place] recommended for all adults, regardless of age or health.  Vaccines  Flu vaccine: recommended for almost everyone, every fall.   Shingles vaccine: Shingrix recommended after age 85. Can add you to our clinic waiting list or can contact your pharmacy.   Pneumonia vaccines: Prevnar and Pneumovax recommended after age 42, or sooner if certain medical conditions.  Tetanus booster: Tdap recommended every 10 years. Done 12/2012 Cancer screenings   Colon cancer screening: recommended for everyone at age 32, but some folks need a colonoscopy sooner if risk factors   Breast cancer screening: mammogram recommended at age 72 every other year at least, and annually after age 46.   Cervical cancer screening: Pap every 1 to 5 years depending on age and other risk factors. Can  usually stop at age 70 or w/ hysterectomy.  Lung cancer screening: not needed for non-smokers  Infection screenings . HIV: recommended screening at least once age 80-65, more often as needed. . Gonorrhea/Chlamydia: screening as needed. . Hepatitis C: recommended for anyone born 48-1963 . TB: certain at-risk populations, or depending on work requirements and/or travel history Other . Bone Density Test: recommended for women at age 39, sooner depending on risk factors       Visit summary with medication list and pertinent instructions was printed for patient to review. All questions at time of visit were answered - patient instructed to contact office with any additional concerns or updates. ER/RTC precautions were reviewed with the patient.   Note: Total time spent on problem-based portion of visit was 15 minutes, greater than 50% of the time was spent face-to-face counseling and coordinating care for the above diagnoses listed in assessment/plan other than Annual Physical.    Please note: voice recognition software was used to produce this document, and typos may escape review. Please contact Dr. Sheppard Coil for any needed clarifications.     Follow-up plan: Return in about 4 weeks (around 06/19/2018) for recheck pain/fatigue - sooner if needed! Marland Kitchen

## 2018-05-23 ENCOUNTER — Encounter: Payer: Self-pay | Admitting: Osteopathic Medicine

## 2018-06-27 ENCOUNTER — Encounter: Payer: Self-pay | Admitting: Osteopathic Medicine

## 2018-06-27 ENCOUNTER — Ambulatory Visit (INDEPENDENT_AMBULATORY_CARE_PROVIDER_SITE_OTHER): Payer: 59 | Admitting: Osteopathic Medicine

## 2018-06-27 VITALS — BP 108/70 | HR 88 | Wt 141.0 lb

## 2018-06-27 DIAGNOSIS — R5382 Chronic fatigue, unspecified: Secondary | ICD-10-CM

## 2018-06-27 DIAGNOSIS — M67442 Ganglion, left hand: Secondary | ICD-10-CM

## 2018-06-27 DIAGNOSIS — M797 Fibromyalgia: Secondary | ICD-10-CM

## 2018-06-27 NOTE — Progress Notes (Signed)
HPI: Kara Cooper is a 53 y.o. female who  has a past medical history of Acute right pyelonephritis (04/04/2017), Anemia, Basal cell carcinoma, Chicken pox, Headache(784.0), History of seasonal allergies, Irregular menses, Migraine, Other and unspecified ovarian cysts, Postural orthostatic tachycardia syndrome, and UTI (lower urinary tract infection).  she presents to [facility] today, 06/27/18,  for chief complaint of:  Follow-up fatigue, body aches  Doing really well w/ elavil 25 mg and celebrex 200 mg. Would like to maybe go up on the elavil, she has plenty of the 25 mg pills left over.   Ganglion cyst on DIP of L index finger recurrent and bothering her.    At today's visit 06/27/18 ... PMH, PSH, FH reviewed and updated as needed.  Current medication list and allergy/intolerance hx reviewed and updated as needed. (See remainder of HPI, ROS, Phys Exam below)         ASSESSMENT/PLAN: The primary encounter diagnosis was Chronic fatigue. A diagnosis of Fibromyalgia syndrome was also pertinent to this visit.   Improved w/ TCA and NSAID, ok to refill x1 year     Patient Instructions  Will refer to hand surgeon for cyst Call/message [place] w/ the dose of Elavil that's working best for you!       Follow-up plan: Return in about 1 year (around 06/27/2019) for annual physical - sooner as needed.                                                 ################################################# ################################################# ################################################# #################################################    Current Meds  Medication Sig  . acetaminophen (TYLENOL) 500 MG tablet Take OTC as directed   . amitriptyline (ELAVIL) 25 MG tablet Take 1-4 tablets (25-100 mg total) by mouth at bedtime. Increase by 1 tablet at a time once per week to max 100 mg  . b  complex vitamins tablet Take 1 tablet by mouth daily.  . celecoxib (CELEBREX) 200 MG capsule One to 2 tablets by mouth daily as needed for pain.  . Cyanocobalamin (B-12) 1000 MCG SUBL Place under the tongue daily.   . fluticasone (FLONASE) 50 MCG/ACT nasal spray USE 2 SPRAYS IN EACH NOSTRIL DAILY AS NEEDED  . levothyroxine (SYNTHROID, LEVOTHROID) 75 MCG tablet Take 1 tablet (75 mcg total) by mouth daily before breakfast. Will be due for lab work when pill bottle is running low  . Multiple Vitamin (MULTIVITAMIN) tablet Take 1 tablet by mouth daily.    Marland Kitchen selenium 50 MCG TABS tablet Take 50 mcg by mouth daily.  . SUMAtriptan (IMITREX) 50 MG tablet Take 1 tablet (50 mg total) by mouth daily as needed for migraine. Take one by mouth times one as needed for migraine- max one pill in a day.    No Known Allergies     Review of Systems:  Constitutional: No recent illness  HEENT: No  headache, no vision change  Cardiac: No  chest pain  Respiratory:  No  shortness of breath  Gastrointestinal: No  abdominal pain  Musculoskeletal: No new myalgia/arthralgia  Neurologic: No  weakness, No  Dizziness  Psychiatric: No  concerns with depression, No  concerns with anxiety  Exam:  BP 108/70   Pulse 88   Wt 141 lb (64 kg)   BMI 25.79 kg/m   Constitutional: VS see above. General Appearance: alert, well-developed, well-nourished,  NAD  Eyes: Normal lids and conjunctive, non-icteric sclera  Ears, Nose, Mouth, Throat: MMM, Normal external inspection ears/nares/mouth/lips/gums.  Neck: No masses, trachea midline.   Respiratory: Normal respiratory effort  Musculoskeletal: Gait normal. Symmetric and independent movement of all extremities  Neurological: Normal balance/coordination. No tremor.  Skin: warm, dry, intact.   Psychiatric: Normal judgment/insight. Normal mood and affect. Oriented x3.       Visit summary with medication list and pertinent instructions was printed for patient to  review, patient was advised to alert [place] if any updates are needed. All questions at time of visit were answered - patient instructed to contact office with any additional concerns. ER/RTC precautions were reviewed with the patient and understanding verbalized.   Note: Total time spent 15 minutes, greater than 50% of the visit was spent face-to-face counseling and coordinating care for the following: The primary encounter diagnosis was Chronic fatigue. A diagnosis of Fibromyalgia syndrome was also pertinent to this visit.Marland Kitchen  Please note: voice recognition software was used to produce this document, and typos may escape review. Please contact Dr. Sheppard Coil for any needed clarifications.    Follow up plan: Return in about 1 year (around 06/27/2019) for annual physical - sooner as needed.

## 2018-06-27 NOTE — Patient Instructions (Signed)
Will refer to hand surgeon for cyst Call/message [place] w/ the dose of Elavil that's working best for you!

## 2018-07-03 ENCOUNTER — Other Ambulatory Visit: Payer: Self-pay | Admitting: Osteopathic Medicine

## 2018-07-06 ENCOUNTER — Other Ambulatory Visit: Payer: 59

## 2018-07-10 ENCOUNTER — Other Ambulatory Visit: Payer: Self-pay | Admitting: Osteopathic Medicine

## 2018-08-03 ENCOUNTER — Other Ambulatory Visit: Payer: Self-pay | Admitting: Osteopathic Medicine

## 2018-08-03 NOTE — Telephone Encounter (Signed)
Routed to Dr Redgie Grayer rx refill pool

## 2018-08-04 ENCOUNTER — Other Ambulatory Visit: Payer: Self-pay | Admitting: Osteopathic Medicine

## 2018-09-04 ENCOUNTER — Other Ambulatory Visit: Payer: Self-pay | Admitting: Osteopathic Medicine

## 2018-09-04 NOTE — Telephone Encounter (Signed)
Routed to Dr. Redgie Grayer rx refill pool

## 2018-09-05 NOTE — Telephone Encounter (Signed)
Ok sent!

## 2018-09-05 NOTE — Telephone Encounter (Signed)
Can we call patient and confirm which dose she is using? Initial Rx for 25 mg pills, written to titrate up to 4 pills / 100 mg qhs max

## 2018-09-05 NOTE — Telephone Encounter (Signed)
Pt has been updated regarding adjustments on amtriptyline rx. No other inquiries during call.

## 2018-09-05 NOTE — Telephone Encounter (Signed)
As per pt - currently taking 25 mg during the week and 50 mg on the weekend. Maximum qhs dose is 50 mg because she wakes up the next day feeling "heavy". Due to insurance, she is requesting rx be written for 90 day supply.

## 2018-09-25 ENCOUNTER — Encounter: Payer: Self-pay | Admitting: Internal Medicine

## 2018-09-28 ENCOUNTER — Ambulatory Visit (INDEPENDENT_AMBULATORY_CARE_PROVIDER_SITE_OTHER): Payer: 59 | Admitting: Internal Medicine

## 2018-09-28 ENCOUNTER — Other Ambulatory Visit: Payer: Self-pay

## 2018-09-28 ENCOUNTER — Encounter: Payer: Self-pay | Admitting: Internal Medicine

## 2018-09-28 DIAGNOSIS — E063 Autoimmune thyroiditis: Secondary | ICD-10-CM | POA: Diagnosis not present

## 2018-09-28 DIAGNOSIS — E038 Other specified hypothyroidism: Secondary | ICD-10-CM

## 2018-09-28 DIAGNOSIS — R635 Abnormal weight gain: Secondary | ICD-10-CM | POA: Diagnosis not present

## 2018-09-28 NOTE — Progress Notes (Addendum)
Patient ID: Kara Cooper, female   DOB: 16-Aug-1965, 53 y.o.   MRN: [id number]   Patient location: Home My location: Office  Referring Provider: Emeterio Reeve, DO  I connected with the patient on 09/28/18 at  4:15 PM EDT by a video enabled telemedicine application and verified that I am speaking with the correct person.   I discussed the limitations of evaluation and management by telemedicine and the availability of in person appointments. The patient expressed understanding and agreed to proceed.   Details of the encounter are shown below.  HPI  Kara Cooper is a 53 y.o.-year-old female, presenting for follow-up for hypothyroidism, diagnosed as Hashimoto's thyroiditis at last visit.  Last visit 9 months ago.  Patient called me 2 days ago that she started to feel more tired and having more hair loss in last 2 months.  She also gained approximately 15-20 pounds in the last 2 months. The only change amitriptyline 6 months ago.  This helps her with sleep.  Pt. has been dx with hypothyroidism in 2015 (but had an elevated TSH in 2009 at her visit with OB/GYN) >> started on Levothyroxine 25 mcg 05/2017.  She does not feel much different after she started it.  Since then, dose was increased to 50 (12/2017) and more recently to 75 mcg daily (04/2018-by PCP).  He is currently on 75 mcg of levothyroxine taken: - in am - fasting - at least 30 min from b'fast - no Ca, Fe, PPIs - + MVI at night - not on Biotin  Reviewed her TFTs: Lab Results  Component Value Date   TSH 5.45 (H) 05/15/2018   TSH 4.17 01/05/2018   TSH 3.11 11/13/2017   TSH 5.34 (H) 05/19/2017   TSH 5.40 12/19/2012   TSH 3.74 01/03/2011   TSH 4.28 09/03/2010   TSH 4.05 06/19/2008   FREET4 0.94 01/05/2018   FREET4 0.68 11/13/2017   FREET4 0.9 05/19/2017  No results found for: T3FREE   Pt denies: - feeling nodules in neck - hoarseness - dysphagia - choking - SOB with lying down  She has + FH of thyroid  disorders. No FH of thyroid cancer. No h/o radiation tx to head or neck.  No seaweed or kelp. No recent contrast studies. No herbal supplements. No Biotin use- she was on this in the past. No recent steroids use.   Pt. also has a history of migraines, fibroids-status post fibroidectomy 2017, skin cancer.    She continues to exercise 2-3 times a week: Cardio, cycling, walking, weights  Started lexapro before last visit.  This helps. She stopped HRT as she was having breakthrough bleeding and HAs.  ROS: Constitutional:+ weight gain - 15-20 lbs/no weight loss, + fatigue, + hot flashes, no subjective hypothermia Eyes: no blurry vision, no xerophthalmia ENT: no sore throat, + see HPI Cardiovascular: no CP/no SOB/no palpitations/no leg swelling Respiratory: no cough/no SOB/no wheezing Gastrointestinal: no N/no V/no D/no C/no acid reflux Musculoskeletal: no muscle aches/no joint aches Skin: no rashes, + hair loss Neurological: no tremors/no numbness/no tingling/no dizziness  I reviewed pt's medications, allergies, PMH, social hx, family hx, and changes were documented in the history of present illness. Otherwise, unchanged from my initial visit note.  Past Medical History:  Diagnosis Date  . Acute right pyelonephritis 04/04/2017  . Anemia    in past  . Basal cell carcinoma    treated with Moh's procedure  . Chicken pox    in past  . Headache(784.0)    frequent  .  History of seasonal allergies   . Irregular menses   . Migraine   . Other and unspecified ovarian cysts    come and go  . Postural orthostatic tachycardia syndrome    Dr. Burt Knack  . UTI (lower urinary tract infection)    history of   Past Surgical History:  Procedure Laterality Date  . CERVICAL CONIZATION W/BX  1987, 1988  . DILATION AND CURETTAGE OF UTERUS     Social History   Socioeconomic History  . Marital status: Legally Separated    Spouse name: Not on file  . Number of children: 2  . Years of education:  Not on file  . Highest education level: Not on file  Occupational History  . Occupation: Therapist, sports  Social Needs  . Financial resource strain: Not on file  . Food insecurity    Worry: Not on file    Inability: Not on file  . Transportation needs    Medical: Not on file    Non-medical: Not on file  Tobacco Use  . Smoking status: Never Smoker  . Smokeless tobacco: Never Used  Substance and Sexual Activity  . Alcohol use: Yes    Comment: Occasional  . Drug use: No  . Sexual activity: Not on file  Lifestyle  . Physical activity    Days per week: Not on file    Minutes per session: Not on file  . Stress: Not on file  Relationships  . Social Herbalist on phone: Not on file    Gets together: Not on file    Attends religious service: Not on file    Active member of club or organization: Not on file    Attends meetings of clubs or organizations: Not on file    Relationship status: Not on file  . Intimate partner violence    Fear of current or ex partner: Not on file    Emotionally abused: Not on file    Physically abused: Not on file    Forced sexual activity: Not on file  Other Topics Concern  . Not on file  Social History Narrative   Student.  Exercises every day, also cycles.   Current Outpatient Medications on File Prior to Visit  Medication Sig Dispense Refill  . acetaminophen (TYLENOL) 500 MG tablet Take OTC as directed     . amitriptyline (ELAVIL) 25 MG tablet Take 1-2 tablets (25-50 mg total) by mouth at bedtime as needed for sleep. 180 tablet 1  . b complex vitamins tablet Take 1 tablet by mouth daily.    . celecoxib (CELEBREX) 200 MG capsule One to 2 tablets by mouth daily as needed for pain. 180 capsule 3  . Cyanocobalamin (B-12) 1000 MCG SUBL Place under the tongue daily.     . fluticasone (FLONASE) 50 MCG/ACT nasal spray USE 2 SPRAYS IN EACH NOSTRIL DAILY AS NEEDED 16 g 11  . levothyroxine (SYNTHROID) 75 MCG tablet TAKE 1 TABLET (75 MCG TOTAL) BY MOUTH DAILY  BEFORE BREAKFAST. DUE FOR LAB WORK 90 tablet 1  . Multiple Vitamin (MULTIVITAMIN) tablet Take 1 tablet by mouth daily.      Marland Kitchen selenium 50 MCG TABS tablet Take 50 mcg by mouth daily.    . SUMAtriptan (IMITREX) 50 MG tablet Take 1 tablet (50 mg total) by mouth daily as needed for migraine. Take one by mouth times one as needed for migraine- max one pill in a day. 10 tablet 5   No current facility-administered medications  on file prior to visit.    No Known Allergies Family History  Problem Relation Age of Onset  . Hyperlipidemia Mother   . Heart disease Mother        A-fib, ablation  . Migraines Daughter   . Diabetes Daughter        type I  . Depression Daughter        bipolar    PE: There were no vitals taken for this visit. Wt Readings from Last 3 Encounters:  06/27/18 141 lb (64 kg)  05/22/18 138 lb 3.2 oz (62.7 kg)  04/23/18 136 lb 6.4 oz (61.9 kg)   Constitutional:  in NAD  The physical exam was not performed (virtual visit).  ASSESSMENT: 1.  Hashimoto's hypothyroidism  2.  Weight gain   PLAN:  1. Patient with few years history of mild hypothyroidism, with persistent fatigue, hair loss, heat intolerance.  At last visit, she was on 25 mcg levothyroxine daily but the TSH returned close to the upper limit of normal so we increased the dose to 50 mcg daily.  Since then, PCP increase the dose further to 75 mcg daily in 04/2018 as a TSH returned above the upper limit of normal. -In the past with diagnosed Hashimoto's thyroiditis based on elevated thyroid antibodies.  I did suggest selenium which can help with decreasing the antibodies.  She is not on this now, but took this in the past. - she continues on LT4 75 mcg daily - pt feels good on this dose, but she contacted me several days ago with fatigue, hair loss, weight gain fluid retention and heaviness.  We will bring her back to the clinic to check her thyroid labs and I will also add antibody levels.  If higher, we may need  to add back selenium. - we discussed about taking the thyroid hormone every day, with water, >30 minutes before breakfast, separated by >4 hours from acid reflux medications, calcium, iron, multivitamins. Pt. is taking it correctly. -I will see the patient back in a year, or sooner, as needed, depending on her labs and symptoms  2.  Weight gain  -new -Patient tells me that she gained 15-20 pounds in the last several months.  Upon questioning, she was started on amitriptyline to help with sleep approximately 6 months ago.  I advised the patient that amitriptyline can cause fatigue, weight gain, and even hair loss.  We discussed that if her TFTs are normal, to try to come off amitriptyline.  Component     Latest Ref Rng & Units 10/04/2018  Thyroglobulin Ab     < or = 1 IU/mL <1  Thyroperoxidase Ab SerPl-aCnc     <9 IU/mL 194 (H)  T4,Free(Direct)     0.60 - 1.60 ng/dL 1.18  TSH     0.35 - 4.50 uIU/mL 1.94   TPO slightly higher. TFTs perfect. Can add Selenium 200 mcg daily.  Philemon Kingdom, MD PhD [facility] Endocrinology

## 2018-09-28 NOTE — Patient Instructions (Signed)
Please come back for labs when possible.  Please continue Levothyroxine 75 mcg daily.  Take the thyroid hormone every day, with water, at least 30 minutes before breakfast, separated by at least 4 hours from: - acid reflux medications - calcium - iron - multivitamins  Please come back for a follow-up appointment in 1 year.

## 2018-10-04 ENCOUNTER — Other Ambulatory Visit (INDEPENDENT_AMBULATORY_CARE_PROVIDER_SITE_OTHER): Payer: 59

## 2018-10-04 ENCOUNTER — Other Ambulatory Visit: Payer: Self-pay

## 2018-10-04 DIAGNOSIS — E038 Other specified hypothyroidism: Secondary | ICD-10-CM | POA: Diagnosis not present

## 2018-10-04 DIAGNOSIS — E063 Autoimmune thyroiditis: Secondary | ICD-10-CM

## 2018-10-04 LAB — TSH: TSH: 1.94 u[IU]/mL (ref 0.35–4.50)

## 2018-10-04 LAB — T4, FREE: Free T4: 1.18 ng/dL (ref 0.60–1.60)

## 2018-10-05 LAB — THYROGLOBULIN ANTIBODY: Thyroglobulin Ab: <1 IU/mL (ref <=1)

## 2018-10-05 LAB — THYROID PEROXIDASE ANTIBODY: Thyroperoxidase Ab SerPl-aCnc: 194 IU/mL — ABNORMAL HIGH (ref <9)

## 2018-10-09 ENCOUNTER — Encounter: Payer: Self-pay | Admitting: Internal Medicine

## 2018-10-16 ENCOUNTER — Encounter: Payer: Self-pay | Admitting: Osteopathic Medicine

## 2018-10-16 ENCOUNTER — Encounter: Payer: Self-pay | Admitting: Internal Medicine

## 2018-10-17 NOTE — Telephone Encounter (Signed)
Last seen 06/2018 and was doing well on all meds History fibromyalgia Let's schedule virtual visit to discus symptoms and  treatment options in detail

## 2018-10-17 NOTE — Telephone Encounter (Signed)
Called and spoke with Marion. Made virtual appointment for 10/18/18 at 7:50am.

## 2018-10-18 ENCOUNTER — Encounter: Payer: Self-pay | Admitting: Osteopathic Medicine

## 2018-10-18 ENCOUNTER — Ambulatory Visit (INDEPENDENT_AMBULATORY_CARE_PROVIDER_SITE_OTHER): Payer: 59 | Admitting: Osteopathic Medicine

## 2018-10-18 VITALS — Wt 142.0 lb

## 2018-10-18 DIAGNOSIS — Z72820 Sleep deprivation: Secondary | ICD-10-CM

## 2018-10-18 DIAGNOSIS — R400 Somnolence: Secondary | ICD-10-CM

## 2018-10-18 DIAGNOSIS — Z9189 Other specified personal risk factors, not elsewhere classified: Secondary | ICD-10-CM

## 2018-10-18 DIAGNOSIS — M797 Fibromyalgia: Secondary | ICD-10-CM

## 2018-10-18 DIAGNOSIS — R0683 Snoring: Secondary | ICD-10-CM

## 2018-10-18 DIAGNOSIS — E038 Other specified hypothyroidism: Secondary | ICD-10-CM

## 2018-10-18 DIAGNOSIS — R5382 Chronic fatigue, unspecified: Secondary | ICD-10-CM | POA: Diagnosis not present

## 2018-10-18 DIAGNOSIS — E063 Autoimmune thyroiditis: Secondary | ICD-10-CM

## 2018-10-18 NOTE — Progress Notes (Signed)
Virtual Visit via Video (App used: Doximity) Note  I connected with      Kara Cooper on 10/18/18 at 7:50 by a telemedicine application and verified that I am speaking with the correct person using two identifiers.  Patient is at home I am in office    I discussed the limitations of evaluation and management by telemedicine and the availability of in person appointments. The patient expressed understanding and agreed to proceed.   History of Present Illness: Kara Cooper is a 53 y.o. female who would like to discuss medications / fibromyalgia / insomnia    From message yesterday:  "I trust you are doing well!  Reaching out re: Amitriptyline. Been on it a bit and began noticing significant weight gain bloating. I thought it might be my thyroid because my hair started falling out like crazy again and excessively tired (again) so touched base with Dr. Darnell Level. Rechecked my levels, they were okay. Only positive antibodies. I did stop the amitriptyline until the results came back. That has been about 3 weeks being off of the amitriptyline. Since being off... I do not feel bloated; but, I am not sleeping again and legs are achy. Dr. Darnell Level suggested not going back on amitriptyline. I believe an indirect effect on the thyroid. Please refer to her notes. Need your direction and next steps." Dr. Cruzita Lederer assured patient that amitriptyline affecting the thyroid was not an issue.   Previous fibromyalgia treatments include... TCA: Amitriptyline Yes  SSRI/SNRI: Duloxetine No but previously on Lexapro  Muscle Relaxer: Cyclobenzaprine No  Anticonvulsants: Gabapentin No , Pregabalin No  NSAID yes, taking Celebrex physical activity - walking daily, cycling, yoga.   Sleeping / lack of sleep seems to be greatest concern, some cramping/muscle pain on occasion. Reports snoring, thinks might stop breathing sometimes, waking up choking sensation. Reports fatigue during the day.   Wt Readings from Last 3 Encounters:   10/18/18 142 lb (64.4 kg)  06/27/18 141 lb (64 kg)  05/22/18 138 lb 3.2 oz (62.7 kg)    Depression screen [facility] 2/9 10/18/2018 12/01/2017 10/25/2017  Decreased Interest 0 1 1  Down, Depressed, Hopeless 1 0 1  PHQ - 2 Score 1 1 2   Altered sleeping 1 3 3   Tired, decreased energy 1 3 3   Change in appetite 0 0 0  Feeling bad or failure about yourself  1 0 1  Trouble concentrating 1 2 3   Moving slowly or fidgety/restless 0 1 2  Suicidal thoughts 0 0 0  PHQ-9 Score 5 10 14   Difficult doing work/chores Not difficult at all Very difficult Somewhat difficult   GAD 7 : Generalized Anxiety Score 10/18/2018 12/01/2017 10/25/2017  Nervous, Anxious, on Edge 0 2 0  Control/stop worrying 1 1 2   Worry too much - different things 1 1 2   Trouble relaxing 2 2 1   Restless 0 2 1  Easily annoyed or irritable 1 2 2   Afraid - awful might happen 0 0 0  Total GAD 7 Score 5 10 8   Anxiety Difficulty Not difficult at all Very difficult Somewhat difficult           Observations/Objective: There were no vitals taken for this visit. BP Readings from Last 3 Encounters:  06/27/18 108/70  05/22/18 103/69  04/23/18 98/62   Exam: Normal Speech.  NAD  Lab and Radiology Results No results found for this or any previous visit (from the past 72 hour(s)). No results found.     Assessment and  Plan: 53 y.o. female with The primary encounter diagnosis was Poor sleep. Diagnoses of Snoring, Chronic fatigue, Daytime somnolence, Fibromyalgia syndrome, Hypothyroidism due to Hashimoto's thyroiditis, and Risk factors for obstructive sleep apnea were also pertinent to this visit.  Could try melatonin but might be helpful to get sleep study prior to messing with medications.   PDMP not reviewed this encounter. Orders Placed This Encounter  Procedures  . Ambulatory referral to Sleep Studies    Referral Priority:   Routine    Referral Type:   Consultation    Referral Reason:   Specialty Services Required     Number of Visits Requested:   1   No orders of the defined types were placed in this encounter.    Follow Up Instructions: Return for RECHECK PENDING RESULTS / IF WORSE OR CHANGE.    I discussed the assessment and treatment plan with the patient. The patient was provided an opportunity to ask questions and all were answered. The patient agreed with the plan and demonstrated an understanding of the instructions.   The patient was advised to call back or seek an in-person evaluation if any new concerns, if symptoms worsen or if the condition fails to improve as anticipated.  25 minutes of non-face-to-face time was provided during this encounter.                      Historical information moved to improve visibility of documentation.  Past Medical History:  Diagnosis Date  . Acute right pyelonephritis 04/04/2017  . Anemia    in past  . Basal cell carcinoma    treated with Moh's procedure  . Chicken pox    in past  . Headache(784.0)    frequent  . History of seasonal allergies   . Irregular menses   . Migraine   . Other and unspecified ovarian cysts    come and go  . Postural orthostatic tachycardia syndrome    Dr. Burt Knack  . UTI (lower urinary tract infection)    history of   Past Surgical History:  Procedure Laterality Date  . CERVICAL CONIZATION W/BX  1987, 1988  . DILATION AND CURETTAGE OF UTERUS     Social History   Tobacco Use  . Smoking status: Never Smoker  . Smokeless tobacco: Never Used  Substance Use Topics  . Alcohol use: Yes    Comment: Occasional   family history includes Depression in her daughter; Diabetes in her daughter; Heart disease in her mother; Hyperlipidemia in her mother; Migraines in her daughter.  Medications: Current Outpatient Medications  Medication Sig Dispense Refill  . acetaminophen (TYLENOL) 500 MG tablet Take OTC as directed     . amitriptyline (ELAVIL) 25 MG tablet Take 1-2 tablets (25-50 mg total) by mouth  at bedtime as needed for sleep. 180 tablet 1  . b complex vitamins tablet Take 1 tablet by mouth daily.    . celecoxib (CELEBREX) 200 MG capsule One to 2 tablets by mouth daily as needed for pain. 180 capsule 3  . Cyanocobalamin (B-12) 1000 MCG SUBL Place under the tongue daily.     . fluticasone (FLONASE) 50 MCG/ACT nasal spray USE 2 SPRAYS IN EACH NOSTRIL DAILY AS NEEDED 16 g 11  . levothyroxine (SYNTHROID) 75 MCG tablet TAKE 1 TABLET (75 MCG TOTAL) BY MOUTH DAILY BEFORE BREAKFAST. DUE FOR LAB WORK 90 tablet 1  . Multiple Vitamin (MULTIVITAMIN) tablet Take 1 tablet by mouth daily.      Marland Kitchen  selenium 50 MCG TABS tablet Take 50 mcg by mouth daily.    . SUMAtriptan (IMITREX) 50 MG tablet Take 1 tablet (50 mg total) by mouth daily as needed for migraine. Take one by mouth times one as needed for migraine- max one pill in a day. 10 tablet 5   No current facility-administered medications for this visit.    No Known Allergies  PDMP not reviewed this encounter. No orders of the defined types were placed in this encounter.  No orders of the defined types were placed in this encounter.

## 2018-10-24 ENCOUNTER — Encounter: Payer: Self-pay | Admitting: Osteopathic Medicine

## 2018-11-04 ENCOUNTER — Encounter: Payer: Self-pay | Admitting: Osteopathic Medicine

## 2018-11-04 DIAGNOSIS — Z9189 Other specified personal risk factors, not elsewhere classified: Secondary | ICD-10-CM

## 2018-11-08 NOTE — Telephone Encounter (Signed)
Jenny Reichmann, it looks like I accidentally sent this to [facility] instead of [facility], can we change the referral so that she can get set up with a home sleep study?  Thanks!

## 2018-11-11 ENCOUNTER — Encounter: Payer: Self-pay | Admitting: Osteopathic Medicine

## 2018-11-12 NOTE — Telephone Encounter (Signed)
Forwarding to [facility] in referrals

## 2018-11-13 NOTE — Telephone Encounter (Signed)
Does patient want to go to [place]?  If so they contacted her to set up in lab study they do not do home test. If she wants to go to [facility] this has to be put in as an Order not a referral and Delsa Sale can fax it to them.

## 2018-11-13 NOTE — Telephone Encounter (Signed)
Who's doing referrals these days? THanks!

## 2018-11-14 NOTE — Telephone Encounter (Signed)
jenn is printing order and faxing to [facility] _[facility]

## 2018-11-14 NOTE — Telephone Encounter (Signed)
OK order is in, thanks!

## 2019-01-05 ENCOUNTER — Encounter: Payer: Self-pay | Admitting: Internal Medicine

## 2019-01-07 ENCOUNTER — Ambulatory Visit: Payer: 59 | Admitting: Internal Medicine

## 2019-01-23 ENCOUNTER — Encounter: Payer: Self-pay | Admitting: Internal Medicine

## 2019-01-23 ENCOUNTER — Ambulatory Visit (INDEPENDENT_AMBULATORY_CARE_PROVIDER_SITE_OTHER): Payer: Self-pay | Admitting: Internal Medicine

## 2019-01-23 ENCOUNTER — Other Ambulatory Visit: Payer: Self-pay

## 2019-01-23 DIAGNOSIS — L659 Nonscarring hair loss, unspecified: Secondary | ICD-10-CM

## 2019-01-23 DIAGNOSIS — R635 Abnormal weight gain: Secondary | ICD-10-CM

## 2019-01-23 DIAGNOSIS — E063 Autoimmune thyroiditis: Secondary | ICD-10-CM

## 2019-01-23 DIAGNOSIS — E038 Other specified hypothyroidism: Secondary | ICD-10-CM

## 2019-01-23 MED ORDER — LEVOTHYROXINE SODIUM 75 MCG PO TABS: 75.0000 ug | ORAL_TABLET | Freq: Every day | ORAL | 3 refills | Status: DC

## 2019-01-23 NOTE — Progress Notes (Signed)
Patient ID: David Kissinger, female   DOB: July 06, 1965, 53 y.o.   MRN: [id number]   Patient location: Home My location: Office  Referring Provider: Emeterio Reeve, DO  I connected with the patient on 01/23/19 at 7:30 AM EDT by a video enabled telemedicine application and verified that I am speaking with the correct person.   I discussed the limitations of evaluation and management by telemedicine and the availability of in person appointments. The patient expressed understanding and agreed to proceed.   Details of the encounter are shown below.  HPI  Chala Kazemi is a 53 y.o.-year-old female, presenting for follow-up for hypothyroidism, diagnosed as Hashimoto's thyroiditis at last visit.  Last visit 4 months ago.  Before last visit, she called me mentioning increased fatigue, hair loss, and weight gain of approximately 15 to 20 pounds in 2 months.  At that time, I advised her that the recently started amitriptyline would be the culprit.  She tried to come off amitriptyline and felt better and lost ~8 pounds already, but she is not sleeping well and her PCP ordered a sleep study.  She did not have this yet.  Reviewed history: Pt. has been dx with hypothyroidism in 2015 (but had an elevated TSH in 2009 at her visit with OB/GYN) >> started on Levothyroxine 25 mcg 05/2017.  She does not feel much different after she started it.  Since then, dose was increased to 50 (12/2017) and more recently to 75 mcg daily (04/2018-by PCP).  Pt continues on levothyroxine 75 mcg daily, taken: - in am - fasting - at least 30 min from b'fast - no Ca, Fe - + Tums as needed at night - + MVIs at night - restarted on Biotin recently-1000 mcg daily  Reviewed her TFTs: Lab Results  Component Value Date   TSH 1.94 10/04/2018   TSH 5.45 (H) 05/15/2018   TSH 4.17 01/05/2018   TSH 3.11 11/13/2017   TSH 5.34 (H) 05/19/2017   TSH 5.40 12/19/2012   TSH 3.74 01/03/2011   TSH 4.28 09/03/2010   TSH 4.05  06/19/2008   FREET4 1.18 10/04/2018   FREET4 0.94 01/05/2018   FREET4 0.68 11/13/2017   FREET4 0.9 05/19/2017  No results found for: T3FREE   Component     Latest Ref Rng & Units 07/05/2017 10/04/2018  Thyroperoxidase Ab SerPl-aCnc     <9 IU/mL 141 (H) 194 (H)  Thyroglobulin Ab     < or = 1 IU/mL <1 <1   Pt denies: - feeling nodules in neck - hoarseness - dysphagia - choking - SOB with lying down  She has + FH of thyroid disorders. No FH of thyroid cancer. No h/o radiation tx to head or neck.  No seaweed or kelp. No recent contrast studies. No herbal supplements. No recent steroids use.   Pt. also has a history of migraines, fibroids-status post fibroidectomy 2017, skin cancer  She continues to exercise 2-3 times a week: Cardio, cycling, walking, weights  On Lexapro.  She stopped HRT as she was having breakthrough bleeding and HAs.  ROS: Constitutional: no weight gain/+ weight loss, no fatigue, + subjective hyperthermia, no subjective hypothermia Eyes: no blurry vision, no xerophthalmia ENT: no sore throat, + see HPI Cardiovascular: no CP/no SOB/no palpitations/no leg swelling Respiratory: no cough/no SOB/no wheezing Gastrointestinal: no N/no V/no D/no C/no acid reflux Musculoskeletal: no muscle aches/no joint aches Skin: no rashes, + improved hair loss after starting biotin Neurological: no tremors/no numbness/no tingling/no dizziness  I reviewed pt's  medications, allergies, PMH, social hx, family hx, and changes were documented in the history of present illness. Otherwise, unchanged from my initial visit note.  Past Medical History:  Diagnosis Date  . Acute right pyelonephritis 04/04/2017  . Anemia    in past  . Basal cell carcinoma    treated with Moh's procedure  . Chicken pox    in past  . Headache(784.0)    frequent  . History of seasonal allergies   . Irregular menses   . Migraine   . Other and unspecified ovarian cysts    come and go  . Postural  orthostatic tachycardia syndrome    Dr. Burt Knack  . UTI (lower urinary tract infection)    history of   Past Surgical History:  Procedure Laterality Date  . CERVICAL CONIZATION W/BX  1987, 1988  . DILATION AND CURETTAGE OF UTERUS     Social History   Socioeconomic History  . Marital status: Legally Separated    Spouse name: Not on file  . Number of children: 2  . Years of education: Not on file  . Highest education level: Not on file  Occupational History  . Occupation: Therapist, sports  Social Needs  . Financial resource strain: Not on file  . Food insecurity    Worry: Not on file    Inability: Not on file  . Transportation needs    Medical: Not on file    Non-medical: Not on file  Tobacco Use  . Smoking status: Never Smoker  . Smokeless tobacco: Never Used  Substance and Sexual Activity  . Alcohol use: Yes    Comment: Occasional  . Drug use: No  . Sexual activity: Not on file  Lifestyle  . Physical activity    Days per week: Not on file    Minutes per session: Not on file  . Stress: Not on file  Relationships  . Social Herbalist on phone: Not on file    Gets together: Not on file    Attends religious service: Not on file    Active member of club or organization: Not on file    Attends meetings of clubs or organizations: Not on file    Relationship status: Not on file  . Intimate partner violence    Fear of current or ex partner: Not on file    Emotionally abused: Not on file    Physically abused: Not on file    Forced sexual activity: Not on file  Other Topics Concern  . Not on file  Social History Narrative   Student.  Exercises every day, also cycles.   Current Outpatient Medications on File Prior to Visit  Medication Sig Dispense Refill  . acetaminophen (TYLENOL) 500 MG tablet Take OTC as directed     . amitriptyline (ELAVIL) 25 MG tablet Take 1-2 tablets (25-50 mg total) by mouth at bedtime as needed for sleep. (Patient not taking: Reported on 10/18/2018)  180 tablet 1  . b complex vitamins tablet Take 1 tablet by mouth daily.    . Biotin 5000 MCG TABS Take by mouth.    . celecoxib (CELEBREX) 200 MG capsule One to 2 tablets by mouth daily as needed for pain. 180 capsule 3  . Cyanocobalamin (B-12) 1000 MCG SUBL Place under the tongue daily.     . fluticasone (FLONASE) 50 MCG/ACT nasal spray USE 2 SPRAYS IN EACH NOSTRIL DAILY AS NEEDED 16 g 11  . levothyroxine (SYNTHROID) 75 MCG tablet TAKE 1  TABLET (75 MCG TOTAL) BY MOUTH DAILY BEFORE BREAKFAST. DUE FOR LAB WORK 90 tablet 1  . Multiple Vitamin (MULTIVITAMIN) tablet Take 1 tablet by mouth daily.      Marland Kitchen selenium 50 MCG TABS tablet Take 50 mcg by mouth daily.    . SUMAtriptan (IMITREX) 50 MG tablet Take 1 tablet (50 mg total) by mouth daily as needed for migraine. Take one by mouth times one as needed for migraine- max one pill in a day. 10 tablet 5   No current facility-administered medications on file prior to visit.    No Known Allergies Family History  Problem Relation Age of Onset  . Hyperlipidemia Mother   . Heart disease Mother        A-fib, ablation  . Migraines Daughter   . Diabetes Daughter        type I  . Depression Daughter        bipolar    PE: There were no vitals taken for this visit. Wt Readings from Last 3 Encounters:  10/18/18 142 lb (64.4 kg)  06/27/18 141 lb (64 kg)  05/22/18 138 lb 3.2 oz (62.7 kg)   Constitutional:  in NAD  The physical exam was not performed (virtual visit).  ASSESSMENT: 1.  Hashimoto's hypothyroidism  2.  Weight gain   3.  Hair loss  PLAN:  1. Patient with history of mild hypothyroidism, with persistent fatigue, hair loss, heat intolerance.  We increased the dose of levothyroxine from 25, to 50, to 75 mcg daily.  Last dose change was in 04/2018, as a TSH returned above the upper limit of normal.  She does have a diagnosis of Hashimoto's hypothyroidism based on elevated thyroid antibodies.  At last visit I suggested selenium, which can  help with decreasing the antibodies.  She is taking 200 mcg daily now. - latest thyroid labs reviewed with pt >> normal 09/2018 - she continues on LT4 75 mcg daily - pt feels good on this dose but she now has insomnia after stopping amitriptyline, which was causing her weight gain - we discussed about taking the thyroid hormone every day, with water, >30 minutes before breakfast, separated by >4 hours from acid reflux medications, calcium, iron, multivitamins. Pt. is taking it correctly. - will check thyroid tests when she returns to the clinic: TSH and fT4 - I will see her in clinic in 8 months.  2.  Weight gain  -15 to 20 pounds in approximately 2 months -4 months ago we discussed about the fact that this is unlikely to be related to her hypothyroidism, since her TFTs were normal but possibly related to amitriptyline. -Since then, she came off amitriptyline and already lost approximately 8 pounds.  She feels much better.  3.  Hair loss -At last visit, she was also complaining of significant hair loss, and we discussed about starting back on biotin.  She did so recently (taking 1000 mcg daily) and her hair loss improved. -We will continue biotin for now but I advised her to stay off the supplement for at least 2 days before the next set of labs.  Philemon Kingdom, MD PhD [facility] Endocrinology

## 2019-01-23 NOTE — Patient Instructions (Signed)
Please continue Levothyroxine 75 mcg daily and selenium 200 mcg daily..  Take the thyroid hormone every day, with water, at least 30 minutes before breakfast, separated by at least 4 hours from: - acid reflux medications - calcium - iron - multivitamins  Please come back for a follow-up appointment in approximately 8 months.

## 2019-02-14 ENCOUNTER — Telehealth: Payer: 59 | Admitting: Physician Assistant

## 2019-02-14 DIAGNOSIS — R079 Chest pain, unspecified: Secondary | ICD-10-CM

## 2019-02-14 DIAGNOSIS — B349 Viral infection, unspecified: Secondary | ICD-10-CM

## 2019-02-14 NOTE — Progress Notes (Signed)
Based on what you shared with me, I feel your condition warrants further evaluation and I recommend that you be seen for a face to face office visit. I am glad covid test was negative but with continued viral symptoms and chest discomfort I recommend you be seen for assessment of your heart and lungs to make sure there is no pneumonia or other cause of chest discomfort.  NOTE: If you entered your credit card information for this eVisit, you will not be charged. You may see a "hold" on your card for the $35 but that hold will drop off and you will not have a charge processed.  If you are having a true medical emergency please call 911.     For an urgent face to face visit, [place] has four urgent care centers for your convenience:   . [facility]    [phone]                  Get Driving Directions  [phone] [place], [place] [address] . 10 am to 8 pm Monday-Friday . 12 pm to 8 pm Saturday-Sunday   . [facility] at [place]                  Get Driving Directions  [phone] [place], [place], [place] [address] . 8 am to 8 pm Monday-Friday . 9 am to 6 pm Saturday . 11 am to 6 pm Sunday   . [facility] at [place]                  Get Driving Directions   [address] [place], [place] [address] . 8 am to 8 pm Monday-Friday . 8 am to 4 pm Saturday-Sunday    . [facility] at [place]                    Get Driving Directions  [id number]  [address], [place], [place] [address]  . Monday-Friday, 12 PM to 6 PM    Your e-visit answers were reviewed by a board certified advanced clinical practitioner to complete your personal care plan.  Thank you for using e-Visits.

## 2019-02-15 ENCOUNTER — Encounter: Payer: Self-pay | Admitting: Family Medicine

## 2019-02-15 ENCOUNTER — Ambulatory Visit (INDEPENDENT_AMBULATORY_CARE_PROVIDER_SITE_OTHER): Payer: 59

## 2019-02-15 ENCOUNTER — Other Ambulatory Visit: Payer: Self-pay

## 2019-02-15 ENCOUNTER — Ambulatory Visit (INDEPENDENT_AMBULATORY_CARE_PROVIDER_SITE_OTHER): Payer: 59 | Admitting: Family Medicine

## 2019-02-15 VITALS — BP 99/53 | HR 72 | Ht 62.0 in | Wt 138.0 lb

## 2019-02-15 DIAGNOSIS — R0602 Shortness of breath: Secondary | ICD-10-CM

## 2019-02-15 DIAGNOSIS — R079 Chest pain, unspecified: Secondary | ICD-10-CM

## 2019-02-15 NOTE — Progress Notes (Signed)
Acute Office Visit  Subjective:    Patient ID: Kara Cooper, female    DOB: 1966-04-02, 53 y.o.   MRN: [id number]  Chief Complaint  Patient presents with  . Cough    HPI Patient is in today for postnasal and drip that started last weekend so it is been going on for about 6 to 7 days.  She never ran a fever.  She has had a mild scratchy throat no swollen lymph nodes.  She did have a couple days of loose stools but that actually seems to be a little bit better.  She denies any loss of taste or smell.  Her biggest concern is that she is been having a lot of discomfort across her chest particularly on the left side sort of going into her back.  She says laying flat actually makes it feel worse and leaning forward actually feels a little bit better.  She says she really most feels short of breath with it.  The cough has been mostly dry and nonproductive.  No fevers chills or sweats.  No swelling.  She had a negative COVID test through work and had actually already done in ED virtual visit where they had recommended that she actually be seen in the office.  Past Medical History:  Diagnosis Date  . Acute right pyelonephritis 04/04/2017  . Anemia    in past  . Basal cell carcinoma    treated with Moh's procedure  . Chicken pox    in past  . Headache(784.0)    frequent  . History of seasonal allergies   . Irregular menses   . Migraine   . Other and unspecified ovarian cysts    come and go  . Postural orthostatic tachycardia syndrome    Dr. Burt Knack  . UTI (lower urinary tract infection)    history of    Past Surgical History:  Procedure Laterality Date  . CERVICAL CONIZATION W/BX  1987, 1988  . DILATION AND CURETTAGE OF UTERUS      Family History  Problem Relation Age of Onset  . Hyperlipidemia Mother   . Heart disease Mother        A-fib, ablation  . Migraines Daughter   . Diabetes Daughter        type I  . Depression Daughter        bipolar    Social History    Socioeconomic History  . Marital status: Legally Separated    Spouse name: Not on file  . Number of children: 2  . Years of education: Not on file  . Highest education level: Not on file  Occupational History  . Occupation: Therapist, sports  Social Needs  . Financial resource strain: Not on file  . Food insecurity    Worry: Not on file    Inability: Not on file  . Transportation needs    Medical: Not on file    Non-medical: Not on file  Tobacco Use  . Smoking status: Never Smoker  . Smokeless tobacco: Never Used  Substance and Sexual Activity  . Alcohol use: Yes    Comment: Occasional  . Drug use: No  . Sexual activity: Not on file  Lifestyle  . Physical activity    Days per week: Not on file    Minutes per session: Not on file  . Stress: Not on file  Relationships  . Social Herbalist on phone: Not on file    Gets together: Not on file  Attends religious service: Not on file    Active member of club or organization: Not on file    Attends meetings of clubs or organizations: Not on file    Relationship status: Not on file  . Intimate partner violence    Fear of current or ex partner: Not on file    Emotionally abused: Not on file    Physically abused: Not on file    Forced sexual activity: Not on file  Other Topics Concern  . Not on file  Social History Narrative   Student.  Exercises every day, also cycles.    Outpatient Medications Prior to Visit  Medication Sig Dispense Refill  . acetaminophen (TYLENOL) 500 MG tablet Take OTC as directed     . b complex vitamins tablet Take 1 tablet by mouth daily.    . Biotin 5000 MCG TABS Take by mouth.    . celecoxib (CELEBREX) 200 MG capsule One to 2 tablets by mouth daily as needed for pain. 180 capsule 3  . Cyanocobalamin (B-12) 1000 MCG SUBL Place under the tongue daily.     . fluticasone (FLONASE) 50 MCG/ACT nasal spray USE 2 SPRAYS IN EACH NOSTRIL DAILY AS NEEDED 16 g 11  . levothyroxine (SYNTHROID) 75 MCG tablet  Take 1 tablet (75 mcg total) by mouth daily before breakfast. Due for lab work 90 tablet 3  . Multiple Vitamin (MULTIVITAMIN) tablet Take 1 tablet by mouth daily.      Marland Kitchen selenium 50 MCG TABS tablet Take 50 mcg by mouth daily.    . SUMAtriptan (IMITREX) 50 MG tablet Take 1 tablet (50 mg total) by mouth daily as needed for migraine. Take one by mouth times one as needed for migraine- max one pill in a day. 10 tablet 5  . amitriptyline (ELAVIL) 25 MG tablet Take 1-2 tablets (25-50 mg total) by mouth at bedtime as needed for sleep. 180 tablet 1   No facility-administered medications prior to visit.     No Known Allergies  ROS     Objective:    Physical Exam  Constitutional: She is oriented to person, place, and time. She appears well-developed and well-nourished.  HENT:  Head: Normocephalic and atraumatic.  Right Ear: External ear normal.  Left Ear: External ear normal.  Nose: Nose normal.  Mouth/Throat: Oropharynx is clear and moist.  TMs and canals are clear.   Eyes: Pupils are equal, round, and reactive to light. Conjunctivae and EOM are normal.  Neck: Neck supple. No thyromegaly present.  Cardiovascular: Normal rate, regular rhythm and normal heart sounds.  Pulmonary/Chest: Effort normal and breath sounds normal. She has no wheezes.  No pericardial rub.  Lymphadenopathy:    She has no cervical adenopathy.  Neurological: She is alert and oriented to person, place, and time.  Skin: Skin is warm and dry.  Psychiatric: She has a normal mood and affect. Her behavior is normal.    BP (!) 99/53   Pulse 72   Ht 5\' 2"  (1.575 m)   Wt 138 lb (62.6 kg)   LMP 05/03/2017 (Approximate)   SpO2 100%   BMI 25.24 kg/m  Wt Readings from Last 3 Encounters:  02/15/19 138 lb (62.6 kg)  10/18/18 142 lb (64.4 kg)  06/27/18 141 lb (64 kg)    Health Maintenance Due  Topic Date Due  . URINE MICROALBUMIN  05/12/1977  . HIV Screening  05/12/1982  . COLONOSCOPY  05/12/2017  . MAMMOGRAM   10/06/2018  . PAP SMEAR-Modifier  10/06/2018    There are no preventive care reminders to display for this patient.   Lab Results  Component Value Date   TSH 1.94 10/04/2018   Lab Results  Component Value Date   WBC 3.8 05/15/2018   HGB 13.4 05/15/2018   HCT 39.2 05/15/2018   MCV 94.0 05/15/2018   PLT 216 05/15/2018   Lab Results  Component Value Date   NA 143 05/15/2018   K 4.2 05/15/2018   CO2 29 05/15/2018   GLUCOSE 84 05/15/2018   BUN 14 05/15/2018   CREATININE 0.82 05/15/2018   BILITOT 0.4 05/15/2018   ALKPHOS 41 12/19/2012   AST 22 05/15/2018   ALT 24 05/15/2018   PROT 6.9 05/15/2018   ALBUMIN 4.2 12/19/2012   CALCIUM 9.4 05/15/2018   GFR 83.42 12/19/2012   Lab Results  Component Value Date   CHOL 194 05/15/2018   Lab Results  Component Value Date   HDL 61 05/15/2018   Lab Results  Component Value Date   LDLCALC 117 (H) 05/15/2018   Lab Results  Component Value Date   TRIG 64 05/15/2018   Lab Results  Component Value Date   CHOLHDL 3.2 05/15/2018   No results found for: HGBA1C     Assessment & Plan:   Problem List Items Addressed This Visit    None    Visit Diagnoses    Left-sided chest pain    -  Primary   Relevant Orders   EKG 12-Lead   DG Chest 2 View (Completed)   CBC with Differential/Platelet   BASIC METABOLIC PANEL WITH GFR   TSH   SOB (shortness of breath)       Relevant Orders   EKG 12-Lead   DG Chest 2 View (Completed)   CBC with Differential/Platelet   BASIC METABOLIC PANEL WITH GFR   TSH     Left sided chest pain/shortness of breath-discomfort is persistent and worse with laying flat and feels better when she sits forward.  Concerning for pericarditis.  EKG today showed normal sinus rhythm with no ST elevation which was reassuring.  Normal rate.  In addition the chest x-ray was also normal showing no sign of cardiomegaly and no pulmonary disease.  Pulse ox was normal which was very reassuring.  Consider echocardiogram  for further work-up.  Also get a CBC to evaluate for elevated white blood cell count.  She overall is very low risk for cardiac event such as MI.  And she is also very low risk for pulmonary embolism.  But still considered on the differential but less likely.  No orders of the defined types were placed in this encounter.    Beatrice Lecher, MD

## 2019-02-15 NOTE — Addendum Note (Signed)
Addended by: Beatrice Lecher D on: 02/15/2019 11:54 AM   Modules accepted: Orders

## 2019-02-16 LAB — CBC WITH DIFFERENTIAL/PLATELET
Absolute Monocytes: 324 cells/uL (ref 200–950)
Basophils Absolute: 80 cells/uL (ref 0–200)
Basophils Relative: 2 %
Eosinophils Absolute: 152 cells/uL (ref 15–500)
Eosinophils Relative: 3.8 %
HCT: 41.3 % (ref 35.0–45.0)
Hemoglobin: 13.7 g/dL (ref 11.7–15.5)
Lymphs Abs: 1240 cells/uL (ref 850–3900)
MCH: 31.4 pg (ref 27.0–33.0)
MCHC: 33.2 g/dL (ref 32.0–36.0)
MCV: 94.7 fL (ref 80.0–100.0)
MPV: 11.8 fL (ref 7.5–12.5)
Monocytes Relative: 8.1 %
Neutro Abs: 2204 cells/uL (ref 1500–7800)
Neutrophils Relative %: 55.1 %
Platelets: 208 10*3/uL (ref 140–400)
RBC: 4.36 10*6/uL (ref 3.80–5.10)
RDW: 12.2 % (ref 11.0–15.0)
Total Lymphocyte: 31 %
WBC: 4 10*3/uL (ref 3.8–10.8)

## 2019-02-16 LAB — BASIC METABOLIC PANEL WITH GFR
BUN: 15 mg/dL (ref 7–25)
CO2: 26 mmol/L (ref 20–32)
Calcium: 9.5 mg/dL (ref 8.6–10.4)
Chloride: 105 mmol/L (ref 98–110)
Creat: 0.82 mg/dL (ref 0.50–1.05)
GFR, Est African American: 96 mL/min/{1.73_m2} (ref >=60)
GFR, Est Non African American: 83 mL/min/{1.73_m2} (ref >=60)
Glucose, Bld: 84 mg/dL (ref 65–99)
Potassium: 3.9 mmol/L (ref 3.5–5.3)
Sodium: 140 mmol/L (ref 135–146)

## 2019-02-16 LAB — TSH: TSH: 2.52 mIU/L

## 2019-02-18 ENCOUNTER — Other Ambulatory Visit: Payer: Self-pay | Admitting: Osteopathic Medicine

## 2019-02-18 MED ORDER — LEVOTHYROXINE SODIUM 75 MCG PO TABS: 75.0000 ug | ORAL_TABLET | Freq: Every day | ORAL | 3 refills | Status: DC

## 2019-03-10 IMAGING — DX DG HAND 2V*R*
2 series · 2 of 2 positions shown · non-contrast
Comparison: None.

CLINICAL DATA: Pain and swelling in the 5th finger. No acute
injury.

EXAM:
RIGHT HAND - 2 VIEW

[hand pa]
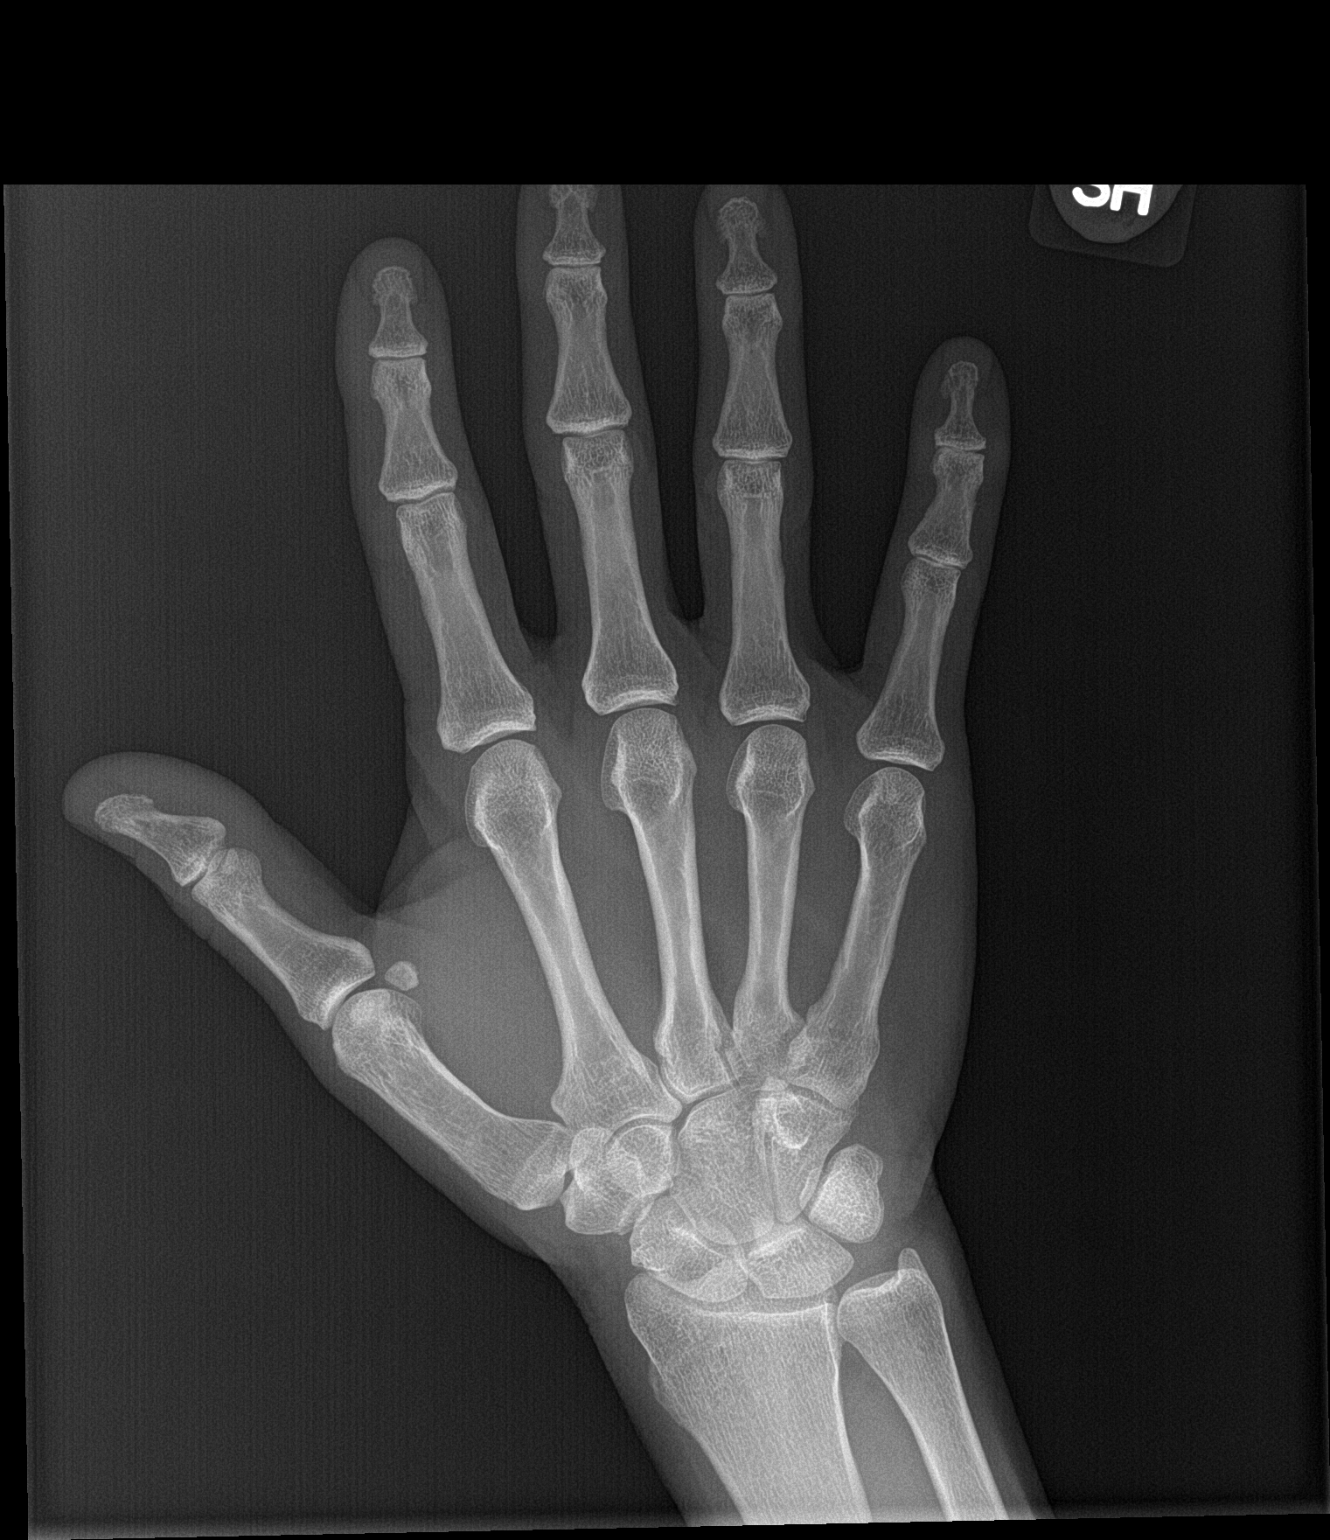

[hand lat]
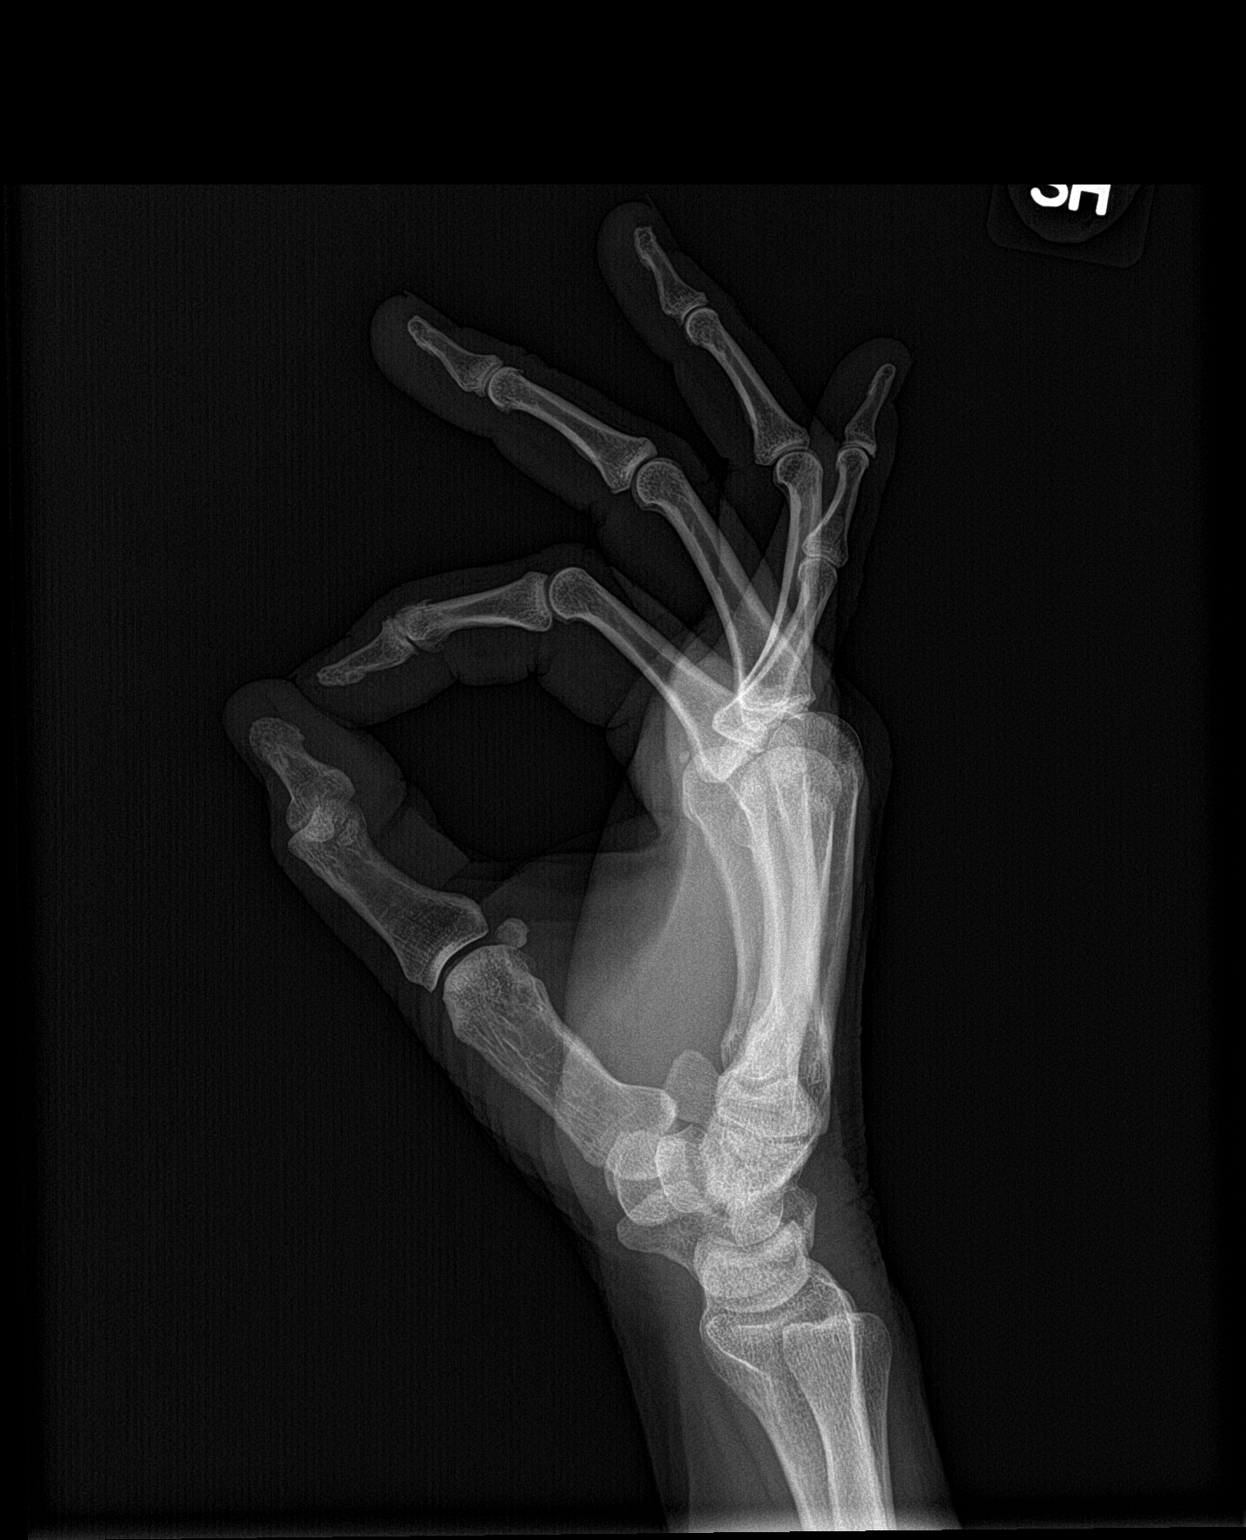

[2 of 2 positions shown; findings below may reference images not displayed]

FINDINGS: The mineralization and alignment are normal. There is no evidence of
acute fracture or dislocation. Mild interphalangeal joint space
narrowing is present, greatest at the 2nd and 3rd DIP joints. No
erosive changes or focal soft tissue abnormalities are seen.
IMPRESSION: Mild interphalangeal osteoarthritis. No acute findings or evidence
of inflammatory arthropathy.

## 2019-03-20 ENCOUNTER — Encounter (HOSPITAL_BASED_OUTPATIENT_CLINIC_OR_DEPARTMENT_OTHER): Payer: Self-pay | Admitting: Internal Medicine

## 2019-03-22 ENCOUNTER — Ambulatory Visit (HOSPITAL_BASED_OUTPATIENT_CLINIC_OR_DEPARTMENT_OTHER): Payer: 59 | Admitting: Internal Medicine

## 2019-03-22 ENCOUNTER — Other Ambulatory Visit: Payer: Self-pay

## 2019-03-25 ENCOUNTER — Telehealth: Payer: 59 | Admitting: Emergency Medicine

## 2019-03-25 DIAGNOSIS — R300 Dysuria: Secondary | ICD-10-CM

## 2019-03-25 MED ORDER — CEPHALEXIN 500 MG PO CAPS: 500.0000 mg | ORAL_CAPSULE | Freq: Two times a day (BID) | ORAL | 0 refills | Status: AC

## 2019-03-25 MED ORDER — PHENAZOPYRIDINE HCL 200 MG PO TABS: 200.0000 mg | ORAL_TABLET | Freq: Three times a day (TID) | ORAL | 0 refills | Status: DC | PRN

## 2019-03-25 NOTE — Progress Notes (Signed)
Time spent: 15 min  MDM: No red flag symptoms reported. Likely uncomplicated cystitis.  Will rx keflex, pyridium, good f/u instructions.   We are sorry that you are not feeling well.  Here is how we plan to help!  Based on what you shared with me it looks like you most likely have a simple urinary tract infection.  A UTI (Urinary Tract Infection) is a bacterial infection of the bladder.  Most cases of urinary tract infections are simple to treat but a key part of your care is to encourage you to drink plenty of fluids and watch your symptoms carefully.  I have prescribed Keflex 500 mg twice a day for 7 days and pyridium which is a bladder numbing medicine that can help with discomfort with urination.  Pyridium can cause urine to look bright orange or yellow, this is normal and should resolve after medicine is completed.  Your symptoms should gradually improve. Call us if the burning in your urine worsens, you develop worsening fever, back pain or pelvic pain or if your symptoms do not resolve after completing the antibiotic.  Seek immediate care if you: Develop fever greater than 100.72F Worsening abdominal or flank pain Inability to urinate Abnormal vaginal discharge or bleeding  Urinary tract infections can be prevented by drinking plenty of water to keep your body hydrated.  Also be sure when you wipe, wipe from front to back and don't hold it in!  If possible, empty your bladder every 4 hours.  Your e-visit answers were reviewed by a board certified advanced clinical practitioner to complete your personal care plan.  Depending on the condition, your plan could have included both over the counter or prescription medications.  If there is a problem please reply  once you have received a response from your provider.  Your safety is important to [place].  If you have drug allergies check your prescription carefully.    You can use MyChart to ask questions about today's visit, request a  non-urgent call back, or ask for a work or school excuse for 24 hours related to this e-Visit. If it has been greater than 24 hours you will need to follow up with your provider, or enter a new e-Visit to address those concerns.   You will get an e-mail in the next two days asking about your experience.  I hope that your e-visit has been valuable and will speed your recovery. Thank you for using e-visits.

## 2019-04-02 DIAGNOSIS — H5203 Hypermetropia, bilateral: Secondary | ICD-10-CM | POA: Diagnosis not present

## 2019-04-02 DIAGNOSIS — H524 Presbyopia: Secondary | ICD-10-CM | POA: Diagnosis not present

## 2019-04-10 ENCOUNTER — Other Ambulatory Visit: Payer: Self-pay

## 2019-04-10 ENCOUNTER — Ambulatory Visit (HOSPITAL_BASED_OUTPATIENT_CLINIC_OR_DEPARTMENT_OTHER): Payer: 59 | Attending: Osteopathic Medicine | Admitting: Internal Medicine

## 2019-04-10 DIAGNOSIS — R0902 Hypoxemia: Secondary | ICD-10-CM | POA: Diagnosis not present

## 2019-04-10 DIAGNOSIS — Z9189 Other specified personal risk factors, not elsewhere classified: Secondary | ICD-10-CM | POA: Diagnosis present

## 2019-04-10 DIAGNOSIS — Z6824 Body mass index (BMI) 24.0-24.9, adult: Secondary | ICD-10-CM | POA: Diagnosis not present

## 2019-04-10 DIAGNOSIS — Z01419 Encounter for gynecological examination (general) (routine) without abnormal findings: Secondary | ICD-10-CM | POA: Diagnosis not present

## 2019-04-10 DIAGNOSIS — R0683 Snoring: Secondary | ICD-10-CM | POA: Diagnosis not present

## 2019-04-10 DIAGNOSIS — Z1231 Encounter for screening mammogram for malignant neoplasm of breast: Secondary | ICD-10-CM | POA: Diagnosis not present

## 2019-04-26 DIAGNOSIS — R0683 Snoring: Secondary | ICD-10-CM

## 2019-04-26 NOTE — Procedures (Signed)
   Patient Name: Kara Cooper, Kara Cooper Date: 04/13/2019 Gender: Female D.O.B: 07-31-1965 Age (years): 53 Referring Provider: Emeterio Reeve Height (inches): 54 Interpreting Physician: Baird Lyons MD, ABSM Weight (lbs): 132 RPSGT: Jacolyn Reedy BMI: 24 MRN: [id number] Neck Size: 12.00  CLINICAL INFORMATION Sleep Study Type: HST Indication for sleep study: OSA Epworth Sleepiness Score: 12  SLEEP STUDY TECHNIQUE A multi-channel overnight portable sleep study was performed. The channels recorded were: nasal airflow, thoracic respiratory movement, and oxygen saturation with a pulse oximetry. Snoring was also monitored.  MEDICATIONS Patient self administered medications include: none reported.  SLEEP ARCHITECTURE Patient was studied for 460.6 minutes. The sleep efficiency was 99.9 % and the patient was supine for 32.2%. The arousal index was 0.0 per hour.  RESPIRATORY PARAMETERS The overall AHI was 2.5 per hour, with a central apnea index of 0.0 per hour. The oxygen nadir was 85% during sleep.  CARDIAC DATA Mean heart rate during sleep was 78.1 bpm.  IMPRESSIONS - No significant obstructive sleep apnea occurred during this study (AHI = 2.5/h). - No significant central sleep apnea occurred during this study (CAI = 0.0/h). - Moderate oxygen desaturation was noted during this study (Min O2 = 85%). Mean sat 95%. - Time with O2 saturation 89% or less was 6.5 minutes. - Patient snored.  DIAGNOSIS - Nocturnal Hypoxemia (327.26 [G47.36 ICD-10]) - Primary snoring  RECOMMENDATIONS - Consider if O2 desaturation in sleep is clinically significant. - Be careful with alcohol, sedatives and other CNS depressants that may worsen sleep apnea and disrupt normal sleep architecture. - Sleep hygiene should be reviewed to assess factors that may improve sleep quality. - Weight management and regular exercise should be initiated or continued  [Electronically signed] 04/26/2019 10:56  AM  Baird Lyons MD, ABSM Diplomate, American Board of Sleep Medicine   NPI: [id number]                          [place] [place] [facility]  ELECTRONICALLY SIGNED ON:  04/26/2019, 10:52 AM [place] PH: [phone]   FX: [phone] [place]

## 2019-07-31 ENCOUNTER — Encounter: Payer: Self-pay | Admitting: Osteopathic Medicine

## 2019-08-13 ENCOUNTER — Telehealth (INDEPENDENT_AMBULATORY_CARE_PROVIDER_SITE_OTHER): Payer: BC Managed Care – PPO | Admitting: Osteopathic Medicine

## 2019-08-13 ENCOUNTER — Encounter: Payer: Self-pay | Admitting: Osteopathic Medicine

## 2019-08-13 VITALS — Wt 134.0 lb

## 2019-08-13 DIAGNOSIS — R5382 Chronic fatigue, unspecified: Secondary | ICD-10-CM | POA: Diagnosis not present

## 2019-08-13 DIAGNOSIS — R238 Other skin changes: Secondary | ICD-10-CM | POA: Diagnosis not present

## 2019-08-13 DIAGNOSIS — Z113 Encounter for screening for infections with a predominantly sexual mode of transmission: Secondary | ICD-10-CM | POA: Diagnosis not present

## 2019-08-13 NOTE — Progress Notes (Signed)
Virtual Visit via Video (App used: Doximity) Note  I connected with      Kara Cooper on 08/13/19 at 8:37 AM  by a telemedicine application and verified that I am speaking with the correct person using two identifiers.  Patient is at home I am in office   I discussed the limitations of evaluation and management by telemedicine and the availability of in person appointments. The patient expressed understanding and agreed to proceed.  History of Present Illness: Kara Cooper is a 54 y.o. female who would like to discuss routien STI screening    Went through divorce a few years ago STD testing desired today - dating again Felt a small bump/rash in pubic area  Has only happened a few times in the past 18 years     Observations/Objective: Wt 134 lb (60.8 kg)   LMP 05/03/2017 (Approximate)   BMI 24.51 kg/m  BP Readings from Last 3 Encounters:  02/15/19 (!) 99/53  06/27/18 108/70  05/22/18 103/69   Exam: Normal Speech.  NAD  Lab and Radiology Results Results for orders placed or performed in visit on 08/13/19 (from the past 72 hour(s))  CBC     Status: Abnormal   Collection Time: 08/14/19  8:04 AM  Result Value Ref Range   WBC 3.4 (L) 3.8 - 10.8 Thousand/uL   RBC 4.21 3.80 - 5.10 Million/uL   Hemoglobin 13.4 11.7 - 15.5 g/dL   HCT 39.8 35.0 - 45.0 %   MCV 94.5 80.0 - 100.0 fL   MCH 31.8 27.0 - 33.0 pg   MCHC 33.7 32.0 - 36.0 g/dL   RDW 11.8 11.0 - 15.0 %   Platelets 214 140 - 400 Thousand/uL   MPV 11.6 7.5 - 12.5 fL   No results found.     Assessment and Plan: 53 y.o. female with The primary encounter diagnosis was Routine screening for STI (sexually transmitted infection). Diagnoses of Chronic fatigue and Easy bruising were also pertinent to this visit.  Discussed antibody testing for HSV not a reliable indicator of current infection, implications for treatment with Valtrex if she were positive, does not clinically sound like she has ever had a genital HSV  outbreak or cold sores  PDMP not reviewed this encounter. Orders Placed This Encounter  Procedures  . C. trachomatis/N. gonorrhoeae RNA  . Hepatitis B surface antigen  . HIV Antibody (routine testing w rflx)  . RPR  . Hepatitis B core antibody, total  . CBC  . Lipid panel  . Hepatitis C antibody  . HSV 1 antibody, IgG  . HSV 2 antibody, IgG  . HSV 1/2 Ab (IgM), IFA w/rflx Titer   No orders of the defined types were placed in this encounter.    Follow Up Instructions: Return for RECHECK PENDING RESULTS / IF WORSE OR CHANGE.    I discussed the assessment and treatment plan with the patient. The patient was provided an opportunity to ask questions and all were answered. The patient agreed with the plan and demonstrated an understanding of the instructions.   The patient was advised to call back or seek an in-person evaluation if any new concerns, if symptoms worsen or if the condition fails to improve as anticipated.  20 minutes of non-face-to-face time was provided during this encounter.      . . . . . . . . . . . . . Marland Kitchen  Historical information moved to improve visibility of documentation.  Past Medical History:  Diagnosis Date  . Acute right pyelonephritis 04/04/2017  . Anemia    in past  . Basal cell carcinoma    treated with Moh's procedure  . Chicken pox    in past  . Headache(784.0)    frequent  . History of seasonal allergies   . Irregular menses   . Migraine   . Other and unspecified ovarian cysts    come and go  . Postural orthostatic tachycardia syndrome    Dr. Burt Knack  . UTI (lower urinary tract infection)    history of   Past Surgical History:  Procedure Laterality Date  . CERVICAL CONIZATION W/BX  1987, 1988  . DILATION AND CURETTAGE OF UTERUS     Social History   Tobacco Use  . Smoking status: Never Smoker  . Smokeless tobacco: Never Used  Substance Use Topics  . Alcohol use: Yes    Comment:  Occasional   family history includes Depression in her daughter; Diabetes in her daughter; Heart disease in her mother; Hyperlipidemia in her mother; Migraines in her daughter.  Medications: Current Outpatient Medications  Medication Sig Dispense Refill  . acetaminophen (TYLENOL) 500 MG tablet Take OTC as directed     . b complex vitamins tablet Take 1 tablet by mouth daily.    . Biotin 5000 MCG TABS Take by mouth.    . Cyanocobalamin (B-12) 1000 MCG SUBL Place under the tongue daily.     . fluticasone (FLONASE) 50 MCG/ACT nasal spray USE 2 SPRAYS IN EACH NOSTRIL DAILY AS NEEDED 16 g 11  . levothyroxine (SYNTHROID) 75 MCG tablet Take 1 tablet (75 mcg total) by mouth daily before breakfast. 90 tablet 3  . Multiple Vitamin (MULTIVITAMIN) tablet Take 1 tablet by mouth daily.      Marland Kitchen selenium 50 MCG TABS tablet Take 50 mcg by mouth daily.    . SUMAtriptan (IMITREX) 50 MG tablet Take 1 tablet (50 mg total) by mouth daily as needed for migraine. Take one by mouth times one as needed for migraine- max one pill in a day. 10 tablet 5  . celecoxib (CELEBREX) 200 MG capsule One to 2 tablets by mouth daily as needed for pain. (Patient not taking: Reported on 08/13/2019) 180 capsule 3  . escitalopram (LEXAPRO) 20 MG tablet Take 20 mg by mouth daily.    . phenazopyridine (PYRIDIUM) 200 MG tablet Take 1 tablet (200 mg total) by mouth 3 (three) times daily as needed for pain. (Patient not taking: Reported on 08/13/2019) 10 tablet 0   No current facility-administered medications for this visit.   No Known Allergies

## 2019-08-14 DIAGNOSIS — Z113 Encounter for screening for infections with a predominantly sexual mode of transmission: Secondary | ICD-10-CM | POA: Diagnosis not present

## 2019-08-14 DIAGNOSIS — R238 Other skin changes: Secondary | ICD-10-CM | POA: Diagnosis not present

## 2019-08-14 DIAGNOSIS — R5382 Chronic fatigue, unspecified: Secondary | ICD-10-CM | POA: Diagnosis not present

## 2019-08-20 ENCOUNTER — Encounter: Payer: Self-pay | Admitting: Osteopathic Medicine

## 2019-08-21 LAB — C. TRACHOMATIS/N. GONORRHOEAE RNA
C. trachomatis RNA, TMA: NOT DETECTED
N. gonorrhoeae RNA, TMA: NOT DETECTED

## 2019-08-21 LAB — HEPATITIS B SURFACE ANTIGEN: Hepatitis B Surface Ag: NONREACTIVE

## 2019-08-21 LAB — RPR: RPR Ser Ql: NONREACTIVE

## 2019-08-21 LAB — HSV 1/2 AB (IGM), IFA W/RFLX TITER
HSV 1 IgM Screen: NEGATIVE
HSV 2 IgM Screen: NEGATIVE

## 2019-08-21 LAB — LIPID PANEL
Cholesterol: 192 mg/dL (ref <200)
HDL: 57 mg/dL (ref >=50)
LDL Cholesterol (Calc): 119 mg/dL (calc) — ABNORMAL HIGH
Non-HDL Cholesterol (Calc): 135 mg/dL (calc) — ABNORMAL HIGH (ref <130)
Total CHOL/HDL Ratio: 3.4 (calc) (ref <5.0)
Triglycerides: 64 mg/dL (ref <150)

## 2019-08-21 LAB — CBC
HCT: 39.8 % (ref 35.0–45.0)
Hemoglobin: 13.4 g/dL (ref 11.7–15.5)
MCH: 31.8 pg (ref 27.0–33.0)
MCHC: 33.7 g/dL (ref 32.0–36.0)
MCV: 94.5 fL (ref 80.0–100.0)
MPV: 11.6 fL (ref 7.5–12.5)
Platelets: 214 10*3/uL (ref 140–400)
RBC: 4.21 10*6/uL (ref 3.80–5.10)
RDW: 11.8 % (ref 11.0–15.0)
WBC: 3.4 10*3/uL — ABNORMAL LOW (ref 3.8–10.8)

## 2019-08-21 LAB — HEPATITIS C ANTIBODY
Hepatitis C Ab: NONREACTIVE
SIGNAL TO CUT-OFF: 0.01 (ref <1.00)

## 2019-08-21 LAB — HIV ANTIBODY (ROUTINE TESTING W REFLEX): HIV 1&2 Ab, 4th Generation: NONREACTIVE

## 2019-08-21 LAB — HSV 1 ANTIBODY, IGG: HSV 1 Glycoprotein G Ab, IgG: 45.1 index — ABNORMAL HIGH

## 2019-08-21 LAB — HSV 2 ANTIBODY, IGG: HSV 2 Glycoprotein G Ab, IgG: 16.1 index — ABNORMAL HIGH

## 2019-08-21 LAB — HEPATITIS B CORE ANTIBODY, TOTAL: Hep B Core Total Ab: NONREACTIVE

## 2019-09-06 NOTE — Telephone Encounter (Signed)
Left patient a voicemail with information below. Let patient know to call us back to schedule an appointment. 

## 2019-09-19 ENCOUNTER — Encounter: Payer: Self-pay | Admitting: Osteopathic Medicine

## 2019-09-19 ENCOUNTER — Telehealth (INDEPENDENT_AMBULATORY_CARE_PROVIDER_SITE_OTHER): Payer: BC Managed Care – PPO | Admitting: Osteopathic Medicine

## 2019-09-19 VITALS — BP 117/64 | Wt 135.0 lb

## 2019-09-19 DIAGNOSIS — R768 Other specified abnormal immunological findings in serum: Secondary | ICD-10-CM | POA: Diagnosis not present

## 2019-09-19 DIAGNOSIS — B009 Herpesviral infection, unspecified: Secondary | ICD-10-CM | POA: Insufficient documentation

## 2019-09-19 MED ORDER — VALACYCLOVIR HCL 500 MG PO TABS
500.0000 mg | ORAL_TABLET | Freq: Every day | ORAL | 3 refills | Status: DC
Start: 2019-09-19 — End: 2021-01-08

## 2019-09-19 NOTE — Progress Notes (Signed)
Virtual Visit via Video (App used: MyChart) Note  I connected with      Kara Cooper on 09/19/19 at 8:30 AM  by a telemedicine application and verified that I am speaking with the correct person using two identifiers.  Patient is at home with dog, Phillip Heal!  I am in office   I discussed the limitations of evaluation and management by telemedicine and the availability of in person appointments. The patient expressed understanding and agreed to proceed.  History of Present Illness: Kara Cooper is a 54 y.o. female who would like to discuss HSV(+) serology    Partner tested, (+)serology for HSV1  She is positive for HSV 1 and 2   Small vaginal blister has occurred a few times for her, no major or obvious outbreak        Observations/Objective: BP 117/64   Wt 135 lb (61.2 kg)   LMP 05/03/2017 (Approximate)   BMI 24.69 kg/m  BP Readings from Last 3 Encounters:  09/19/19 117/64  02/15/19 (!) 99/53  06/27/18 108/70   Exam: Normal Speech.  NAD  Lab and Radiology Results No results found for this or any previous visit (from the past 72 hour(s)). No results found.     Assessment and Plan: 54 y.o. 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  Will start Valtrex to prevent transmission of HSV2 to partner Any questions let me know!   PDMP not reviewed this encounter. No orders of the defined types were placed in this encounter.  Meds ordered this encounter  Medications  . valACYclovir (VALTREX) 500 MG tablet    Sig: Take 1 tablet (500 mg total) by mouth daily.    Dispense:  90 tablet    Refill:  3      Follow Up Instructions: Return in about 9 months (around 06/18/2020) for ANNUAL (call week prior to visit for lab orders).    I discussed the assessment and treatment plan with the patient. The patient was provided an opportunity to ask questions and all were answered. The patient agreed with the plan and demonstrated an understanding  of the instructions.   The patient was advised to call back or seek an in-person evaluation if any new concerns, if symptoms worsen or if the condition fails to improve as anticipated.  20 minutes of non-face-to-face time was provided during this encounter.      . . . . . . . . . . . . . Marland Kitchen                   Historical information moved to improve visibility of documentation.  Past Medical History:  Diagnosis Date  . Acute right pyelonephritis 04/04/2017  . Anemia    in past  . Basal cell carcinoma    treated with Moh's procedure  . Chicken pox    in past  . Headache(784.0)    frequent  . History of seasonal allergies   . Irregular menses   . Migraine   . Other and unspecified ovarian cysts    come and go  . Postural orthostatic tachycardia syndrome    Dr. Burt Knack  . UTI (lower urinary tract infection)    history of   Past Surgical History:  Procedure Laterality Date  . CERVICAL CONIZATION W/BX  1987, 1988  . DILATION AND CURETTAGE OF UTERUS     Social History   Tobacco Use  . Smoking status: Never Smoker  . Smokeless tobacco: Never  Used  Substance Use Topics  . Alcohol use: Yes    Comment: Occasional   family history includes Depression in her daughter; Diabetes in her daughter; Heart disease in her mother; Hyperlipidemia in her mother; Migraines in her daughter.  Medications: Current Outpatient Medications  Medication Sig Dispense Refill  . acetaminophen (TYLENOL) 500 MG tablet Take OTC as directed     . b complex vitamins tablet Take 1 tablet by mouth daily.    . Biotin 5000 MCG TABS Take by mouth.    . Cyanocobalamin (B-12) 1000 MCG SUBL Place under the tongue daily.     Marland Kitchen escitalopram (LEXAPRO) 20 MG tablet Take 20 mg by mouth daily.    . fluticasone (FLONASE) 50 MCG/ACT nasal spray USE 2 SPRAYS IN EACH NOSTRIL DAILY AS NEEDED 16 g 11  . levothyroxine (SYNTHROID) 75 MCG tablet Take 1 tablet (75 mcg total) by mouth daily  before breakfast. 90 tablet 3  . Multiple Vitamin (MULTIVITAMIN) tablet Take 1 tablet by mouth daily.      Marland Kitchen selenium 50 MCG TABS tablet Take 200 mcg by mouth daily.     . SUMAtriptan (IMITREX) 50 MG tablet Take 1 tablet (50 mg total) by mouth daily as needed for migraine. Take one by mouth times one as needed for migraine- max one pill in a day. 10 tablet 5  . valACYclovir (VALTREX) 500 MG tablet Take 1 tablet (500 mg total) by mouth daily. 90 tablet 3   No current facility-administered medications for this visit.   No Known Allergies

## 2019-11-26 ENCOUNTER — Encounter: Payer: Self-pay | Admitting: Osteopathic Medicine

## 2019-11-26 ENCOUNTER — Telehealth (INDEPENDENT_AMBULATORY_CARE_PROVIDER_SITE_OTHER): Payer: BC Managed Care – PPO | Admitting: Osteopathic Medicine

## 2019-11-26 VITALS — BP 129/74 | HR 75 | Temp 98.1°F | Wt 146.0 lb

## 2019-11-26 DIAGNOSIS — F411 Generalized anxiety disorder: Secondary | ICD-10-CM

## 2019-11-26 DIAGNOSIS — F331 Major depressive disorder, recurrent, moderate: Secondary | ICD-10-CM

## 2019-11-26 MED ORDER — BUPROPION HCL ER (XL) 150 MG PO TB24
150.0000 mg | ORAL_TABLET | ORAL | 0 refills | Status: DC
Start: 2019-11-26 — End: 2019-12-24

## 2019-11-26 NOTE — Progress Notes (Signed)
Virtual Visit via Video (App used: MyChart) Note  I connected with      Kara Cooper on 11/26/19 at 9:04 AM  by a telemedicine application and verified that I am speaking with the correct person using two identifiers.  Patient is at home I am in office   I discussed the limitations of evaluation and management by telemedicine and the availability of in person appointments. The patient expressed understanding and agreed to proceed.  History of Present Illness: Kara Cooper is a 54 y.o. female who would like to discuss mental health   Mental health - we started Lexapro few years ago and overall doing well on this, lately feeling more overwhelmed and irritated. Pharmacist friend of her suggested adding Wellbutrin so she'd like to discuss this option.     Depression screen [facility] 2/9 11/26/2019 02/15/2019 10/18/2018  Decreased Interest 1 0 0  Down, Depressed, Hopeless 1 0 1  PHQ - 2 Score 2 0 1  Altered sleeping 3 - 1  Tired, decreased energy 1 - 1  Change in appetite 0 - 0  Feeling bad or failure about yourself  3 - 1  Trouble concentrating 2 - 1  Moving slowly or fidgety/restless 0 - 0  Suicidal thoughts 0 - 0  PHQ-9 Score 11 - 5  Difficult doing work/chores Somewhat difficult - Not difficult at all   GAD 7 : Generalized Anxiety Score 11/26/2019 10/18/2018 12/01/2017 10/25/2017  Nervous, Anxious, on Edge 2 0 2 0  Control/stop worrying 3 1 1 2   Worry too much - different things 3 1 1 2   Trouble relaxing 1 2 2 1   Restless 1 0 2 1  Easily annoyed or irritable 2 1 2 2   Afraid - awful might happen 0 0 0 0  Total GAD 7 Score 12 5 10 8   Anxiety Difficulty Very difficult Not difficult at all Very difficult Somewhat difficult        Observations/Objective: BP 129/74   Pulse 75   Temp 98.1 F (36.7 C)   Wt 146 lb (66.2 kg)   LMP 05/03/2017 (Approximate)   SpO2 97%   BMI 26.70 kg/m  BP Readings from Last 3 Encounters:  11/26/19 129/74  09/19/19 117/64  02/15/19 (!) 99/53    Exam: Normal Speech.  NAD  Lab and Radiology Results No results found for this or any previous visit (from the past 72 hour(s)). No results found.     Assessment and Plan: 54 y.o. female with The primary encounter diagnosis was Moderate episode of recurrent major depressive disorder ([place]). A diagnosis of Generalized anxiety disorder was also pertinent to this visit.  --> trial Wellbutrin 150 mg in addition to Lexapro 20 mg. Pt also requesting referral for counseling.   PDMP not reviewed this encounter. Orders Placed This Encounter  Procedures  . Ambulatory referral to Behavioral Health    Referral Priority:   Routine    Referral Type:   Psychiatric    Referral Reason:   Specialty Services Required    Requested Specialty:   Behavioral Health    Number of Visits Requested:   1   Meds ordered this encounter  Medications  . buPROPion (WELLBUTRIN XL) 150 MG 24 hr tablet    Sig: Take 1 tablet (150 mg total) by mouth every morning.    Dispense:  90 tablet    Refill:  0     Follow Up Instructions: Return for reminder set for MyChart message in 2 weeks, pt  to follow w/ me for office/virtual visit in 4 weeks.    I discussed the assessment and treatment plan with the patient. The patient was provided an opportunity to ask questions and all were answered. The patient agreed with the plan and demonstrated an understanding of the instructions.   The patient was advised to call back or seek an in-person evaluation if any new concerns, if symptoms worsen or if the condition fails to improve as anticipated.  30 minutes of non-face-to-face time was provided during this encounter.      . . . . . . . . . . . . . Marland Kitchen                   Historical information moved to improve visibility of documentation.  Past Medical History:  Diagnosis Date  . Acute right pyelonephritis 04/04/2017  . Anemia    in past  . Basal cell carcinoma    treated with Moh's  procedure  . Chicken pox    in past  . Headache(784.0)    frequent  . History of seasonal allergies   . Irregular menses   . Migraine   . Other and unspecified ovarian cysts    come and go  . Postural orthostatic tachycardia syndrome    Dr. Burt Knack  . UTI (lower urinary tract infection)    history of   Past Surgical History:  Procedure Laterality Date  . CERVICAL CONIZATION W/BX  1987, 1988  . DILATION AND CURETTAGE OF UTERUS     Social History   Tobacco Use  . Smoking status: Never Smoker  . Smokeless tobacco: Never Used  Substance Use Topics  . Alcohol use: Yes    Comment: Occasional   family history includes Depression in her daughter; Diabetes in her daughter; Heart disease in her mother; Hyperlipidemia in her mother; Migraines in her daughter.  Medications: Current Outpatient Medications  Medication Sig Dispense Refill  . acetaminophen (TYLENOL) 500 MG tablet Take OTC as directed     . b complex vitamins tablet Take 1 tablet by mouth daily.    . Biotin 5000 MCG TABS Take by mouth.    . Cyanocobalamin (B-12) 1000 MCG SUBL Place under the tongue daily.     Marland Kitchen escitalopram (LEXAPRO) 20 MG tablet Take 20 mg by mouth daily.    . fluticasone (FLONASE) 50 MCG/ACT nasal spray USE 2 SPRAYS IN EACH NOSTRIL DAILY AS NEEDED 16 g 11  . levothyroxine (SYNTHROID) 75 MCG tablet Take 1 tablet (75 mcg total) by mouth daily before breakfast. 90 tablet 3  . Multiple Vitamin (MULTIVITAMIN) tablet Take 1 tablet by mouth daily.      Marland Kitchen selenium 50 MCG TABS tablet Take 200 mcg by mouth daily.     . SUMAtriptan (IMITREX) 50 MG tablet Take 1 tablet (50 mg total) by mouth daily as needed for migraine. Take one by mouth times one as needed for migraine- max one pill in a day. 10 tablet 5  . valACYclovir (VALTREX) 500 MG tablet Take 1 tablet (500 mg total) by mouth daily. 90 tablet 3  . buPROPion (WELLBUTRIN XL) 150 MG 24 hr tablet Take 1 tablet (150 mg total) by mouth every morning. 90 tablet 0    No current facility-administered medications for this visit.   No Known Allergies

## 2019-12-24 ENCOUNTER — Telehealth (INDEPENDENT_AMBULATORY_CARE_PROVIDER_SITE_OTHER): Payer: BC Managed Care – PPO | Admitting: Osteopathic Medicine

## 2019-12-24 ENCOUNTER — Encounter: Payer: Self-pay | Admitting: Osteopathic Medicine

## 2019-12-24 ENCOUNTER — Ambulatory Visit (INDEPENDENT_AMBULATORY_CARE_PROVIDER_SITE_OTHER): Payer: BC Managed Care – PPO | Admitting: Psychology

## 2019-12-24 VITALS — Wt 144.0 lb

## 2019-12-24 DIAGNOSIS — F321 Major depressive disorder, single episode, moderate: Secondary | ICD-10-CM

## 2019-12-24 DIAGNOSIS — F331 Major depressive disorder, recurrent, moderate: Secondary | ICD-10-CM

## 2019-12-24 DIAGNOSIS — F411 Generalized anxiety disorder: Secondary | ICD-10-CM | POA: Diagnosis not present

## 2019-12-24 MED ORDER — BUPROPION HCL ER (XL) 300 MG PO TB24: 300.0000 mg | ORAL_TABLET | ORAL | 0 refills | Status: DC

## 2019-12-24 NOTE — Progress Notes (Signed)
Virtual Visit via Video (App used: MyChart) Note  I connected with      Kara Cooper on 12/24/19 at 8:00 AM  by a telemedicine application and verified that I am speaking with the correct person using two identifiers.  Patient is at home I am in office   I discussed the limitations of evaluation and management by telemedicine and the availability of in person appointments. The patient expressed understanding and agreed to proceed.  History of Present Illness: Kara Cooper is a 54 y.o. 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 Has appt set up later today w/ counseling. --> increase Wellbutrin to 300 mg, continue Lexapro 20 mg   Last visit 11/26/2019: we started Lexapro few years ago and overall doing well on this, lately feeling more overwhelmed and irritated. Pharmacist friend of her suggested adding Wellbutrin so she'd like to discuss this option. --> trial Wellbutrin 150 mg in addition to Lexapro 20 mg. Pt also requesting referral for counseling.      Depression screen [facility] 2/9 12/24/2019 11/26/2019 02/15/2019  Decreased Interest 0 1 0  Down, Depressed, Hopeless 1 1 0  PHQ - 2 Score 1 2 0  Altered sleeping 3 3 -  Tired, decreased energy 1 1 -  Change in appetite 0 0 -  Feeling bad or failure about yourself  2 3 -  Trouble concentrating 3 2 -  Moving slowly or fidgety/restless 1 0 -  Suicidal thoughts 0 0 -  PHQ-9 Score 11 11 -  Difficult doing work/chores Somewhat difficult Somewhat difficult -   GAD 7 : Generalized Anxiety Score 12/24/2019 11/26/2019 10/18/2018 12/01/2017  Nervous, Anxious, on Edge 1 2 0 2  Control/stop worrying 1 3 1 1   Worry too much - different things 1 3 1 1   Trouble relaxing 2 1 2 2   Restless 2 1 0 2  Easily annoyed or irritable 3 2 1 2   Afraid - awful might happen 0 0 0 0  Total GAD 7 Score 10 12 5 10   Anxiety  Difficulty Somewhat difficult Very difficult Not difficult at all Very difficult        Observations/Objective: Wt 144 lb (65.3 kg)   LMP 05/03/2017 (Approximate)   BMI 26.34 kg/m  BP Readings from Last 3 Encounters:  11/26/19 129/74  09/19/19 117/64  02/15/19 (!) 99/53   Exam: Normal Speech.  NAD  Lab and Radiology Results No results found for this or any previous visit (from the past 72 hour(s)). No results found.     Assessment and Plan: 53 y.o. female with The primary encounter diagnosis was Moderate episode of recurrent major depressive disorder ([place]). A diagnosis of Generalized anxiety disorder was also pertinent to this visit.  --> increase Wellbutrin to 300 mg, continue Lexapro 20 mg   PDMP not reviewed this encounter. No orders of the defined types were placed in this encounter.  Meds ordered this encounter  Medications  . buPROPion (WELLBUTRIN XL) 300 MG 24 hr tablet    Sig: Take 1 tablet (300 mg total) by mouth every morning.    Dispense:  90 tablet    Refill:  0     Follow Up Instructions: Return in about 4 weeks (around 01/21/2020) for [place] .    I discussed the assessment and treatment plan with the patient.  The patient was provided an opportunity to ask questions and all were answered. The patient agreed with the plan and demonstrated an understanding of the instructions.   The patient was advised to call back or seek an in-person evaluation if any new concerns, if symptoms worsen or if the condition fails to improve as anticipated.  30 minutes of non-face-to-face time was provided during this encounter.      . . . . . . . . . . . . . Marland Kitchen                   Historical information moved to improve visibility of documentation.  Past Medical History:  Diagnosis Date  . Acute right pyelonephritis 04/04/2017  . Anemia    in past  . Basal cell carcinoma    treated with Moh's  procedure  . Chicken pox    in past  . Headache(784.0)    frequent  . History of seasonal allergies   . Irregular menses   . Migraine   . Other and unspecified ovarian cysts    come and go  . Postural orthostatic tachycardia syndrome    Dr. Burt Knack  . UTI (lower urinary tract infection)    history of   Past Surgical History:  Procedure Laterality Date  . CERVICAL CONIZATION W/BX  1987, 1988  . DILATION AND CURETTAGE OF UTERUS     Social History   Tobacco Use  . Smoking status: Never Smoker  . Smokeless tobacco: Never Used  Substance Use Topics  . Alcohol use: Yes    Comment: Occasional   family history includes Depression in her daughter; Diabetes in her daughter; Heart disease in her mother; Hyperlipidemia in her mother; Migraines in her daughter.  Medications: Current Outpatient Medications  Medication Sig Dispense Refill  . acetaminophen (TYLENOL) 500 MG tablet Take OTC as directed     . b complex vitamins tablet Take 1 tablet by mouth daily.    Marland Kitchen buPROPion (WELLBUTRIN XL) 300 MG 24 hr tablet Take 1 tablet (300 mg total) by mouth every morning. 90 tablet 0  . Cyanocobalamin (B-12) 1000 MCG SUBL Place under the tongue daily.     Marland Kitchen escitalopram (LEXAPRO) 20 MG tablet Take 20 mg by mouth daily.    . fluticasone (FLONASE) 50 MCG/ACT nasal spray USE 2 SPRAYS IN EACH NOSTRIL DAILY AS NEEDED 16 g 11  . levothyroxine (SYNTHROID) 75 MCG tablet Take 1 tablet (75 mcg total) by mouth daily before breakfast. 90 tablet 3  . selenium 50 MCG TABS tablet Take 200 mcg by mouth daily.     . SUMAtriptan (IMITREX) 50 MG tablet Take 1 tablet (50 mg total) by mouth daily as needed for migraine. Take one by mouth times one as needed for migraine- max one pill in a day. 10 tablet 5  . valACYclovir (VALTREX) 500 MG tablet Take 1 tablet (500 mg total) by mouth daily. 90 tablet 3  . Biotin 5000 MCG TABS Take by mouth.    . Multiple Vitamin (MULTIVITAMIN) tablet Take 1 tablet by mouth daily.        No current facility-administered medications for this visit.   No Known Allergies

## 2020-01-01 ENCOUNTER — Ambulatory Visit (INDEPENDENT_AMBULATORY_CARE_PROVIDER_SITE_OTHER): Payer: BC Managed Care – PPO | Admitting: Psychology

## 2020-01-01 DIAGNOSIS — F321 Major depressive disorder, single episode, moderate: Secondary | ICD-10-CM | POA: Diagnosis not present

## 2020-01-17 ENCOUNTER — Ambulatory Visit (INDEPENDENT_AMBULATORY_CARE_PROVIDER_SITE_OTHER): Payer: BC Managed Care – PPO | Admitting: Psychology

## 2020-01-17 DIAGNOSIS — F321 Major depressive disorder, single episode, moderate: Secondary | ICD-10-CM | POA: Diagnosis not present

## 2020-01-24 ENCOUNTER — Ambulatory Visit (INDEPENDENT_AMBULATORY_CARE_PROVIDER_SITE_OTHER): Payer: BC Managed Care – PPO | Admitting: Psychology

## 2020-01-24 DIAGNOSIS — F321 Major depressive disorder, single episode, moderate: Secondary | ICD-10-CM | POA: Diagnosis not present

## 2020-01-29 ENCOUNTER — Emergency Department (INDEPENDENT_AMBULATORY_CARE_PROVIDER_SITE_OTHER)
Admission: EM | Admit: 2020-01-29 | Discharge: 2020-01-29 | Disposition: A | Discharge status: Home / Self Care | Payer: BC Managed Care – PPO | Source: Home / Self Care | Attending: Family Medicine | Admitting: Family Medicine

## 2020-01-29 ENCOUNTER — Other Ambulatory Visit: Payer: Self-pay

## 2020-01-29 DIAGNOSIS — J069 Acute upper respiratory infection, unspecified: Secondary | ICD-10-CM

## 2020-01-29 DIAGNOSIS — J029 Acute pharyngitis, unspecified: Secondary | ICD-10-CM | POA: Diagnosis not present

## 2020-01-29 LAB — POCT RAPID STREP A (OFFICE): Rapid Strep A Screen: NEGATIVE

## 2020-01-29 MED ORDER — AMOXICILLIN 875 MG PO TABS: 875.0000 mg | ORAL_TABLET | Freq: Two times a day (BID) | ORAL | 0 refills | Status: DC

## 2020-01-29 NOTE — Discharge Instructions (Addendum)
Take plain guaifenesin (1200mg  extended release tabs such as Mucinex) twice daily, with plenty of water, for cough and congestion.  Continue Pseudoephedrine (30mg , one or two every 4 to 6 hours) for sinus congestion.  Get adequate rest.   May use Afrin nasal spray (or generic oxymetazoline) each morning for about 5 days and then discontinue.  Also recommend using saline nasal spray several times daily and saline nasal irrigation (AYR is a common brand).  Use Flonase nasal spray each morning after using Afrin nasal spray and saline nasal irrigation. Try warm salt water gargles for sore throat.  Stop all antihistamines for now (Zyrtec, Nyquil, etc), and other non-prescription cough/cold preparations. May take Delsym Cough Suppressant ("12 Hour Cough Relief") at bedtime for nighttime cough.

## 2020-01-29 NOTE — ED Provider Notes (Signed)
[facility]    CSN: [id number] Arrival date & time: 01/29/20  1221      History   Chief Complaint Chief Complaint  Patient presents with  . Sore Throat    HPI Kara Cooper is a 54 y.o. female.   Patient complains of onset of a sore throat about two weeks ago that has persisted.  She then developed post-nasal drainage and fatigue.  During the past 2 to 3 days she has had sneezing, cough, and mild nausea.  She had a negative rapid COVID test yesterday.  The history is provided by the patient.    Past Medical History:  Diagnosis Date  . Acute right pyelonephritis 04/04/2017  . Anemia    in past  . Basal cell carcinoma    treated with Moh's procedure  . Chicken pox    in past  . Headache(784.0)    frequent  . History of seasonal allergies   . Irregular menses   . Migraine   . Other and unspecified ovarian cysts    come and go  . Postural orthostatic tachycardia syndrome    Dr. Burt Knack  . UTI (lower urinary tract infection)    history of    Patient Active Problem List   Diagnosis Date Noted  . 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 Weight gain 09/28/2018  . Arthralgia of both hands 01/18/2018  . Hypothyroidism due to Hashimoto's thyroiditis 07/05/2017  . Chronic fatigue 05/17/2017  . Fracture of coronoid process of ulna, left, closed 01/24/2017  . Stress reaction 09/27/2013  . Routine general medical examination at a health care facility 12/19/2012  . Syncope/dysautomia 01/03/2011  . ALLERGIC RHINITIS 12/29/2008  . MIGRAINE, CLASSICAL 06/19/2008    Past Surgical History:  Procedure Laterality Date  . CERVICAL CONIZATION W/BX  1987, 1988  . DILATION AND CURETTAGE OF UTERUS      OB History   No obstetric history on file.      Home Medications    Prior to Admission medications   Medication Sig Start Date End Date Taking? Authorizing Provider  acetaminophen (TYLENOL) 500 MG tablet Take OTC as directed     [provider]    amoxicillin (AMOXIL) 875 MG tablet Take 1 tablet (875 mg total) by mouth 2 (two) times daily. 01/29/20   Kandra Nicolas, MD  b complex vitamins tablet Take 1 tablet by mouth daily.    [provider]  Biotin 5000 MCG TABS Take by mouth.    [provider]  buPROPion (WELLBUTRIN XL) 300 MG 24 hr tablet Take 1 tablet (300 mg total) by mouth every morning. 12/24/19   Emeterio Reeve, DO  Cyanocobalamin (B-12) 1000 MCG SUBL Place under the tongue daily.     [provider]  escitalopram (LEXAPRO) 20 MG tablet Take 20 mg by mouth daily. 08/08/19   [provider]  fluticasone (FLONASE) 50 MCG/ACT nasal spray USE 2 SPRAYS IN EACH NOSTRIL DAILY AS NEEDED 07/23/11   Tower, Wynelle Fanny, MD  levothyroxine (SYNTHROID) 75 MCG tablet Take 1 tablet (75 mcg total) by mouth daily before breakfast. 02/18/19   Emeterio Reeve, DO  Multiple Vitamin (MULTIVITAMIN) tablet Take 1 tablet by mouth daily.      [provider]  selenium 50 MCG TABS tablet Take 200 mcg by mouth daily.     [provider]  SUMAtriptan (IMITREX) 50 MG tablet Take 1 tablet (50 mg total) by mouth daily as needed for migraine. Take one by mouth  times one as needed for migraine- max one pill in a day. 05/17/17   Emeterio Reeve, DO  valACYclovir (VALTREX) 500 MG tablet Take 1 tablet (500 mg total) by mouth daily. 09/19/19   Emeterio Reeve, DO    Family History Family History  Problem Relation Age of Onset  . Hyperlipidemia Mother   . Heart disease Mother        A-fib, ablation  . Migraines Daughter   . Diabetes Daughter        type I  . Depression Daughter        bipolar    Social History Social History   Tobacco Use  . Smoking status: Never Smoker  . Smokeless tobacco: Never Used  Substance Use Topics  . Alcohol use: Yes    Comment: Occasional  . Drug use: No     Allergies   Patient has no known allergies.   Review of Systems Review of Systems + sore throat +  cough No pleuritic pain No wheezing + nasal congestion + post-nasal drainage No sinus pain/pressure No itchy/red eyes No earache No hemoptysis No SOB No fever/chills + nausea No vomiting No abdominal pain No diarrhea No urinary symptoms No skin rash + fatigue No myalgias No headache Used OTC meds (pseudoephedrine, Nyquil, Zyrtec) without relief   Physical Exam Triage Vital Signs ED Triage Vitals  Enc Vitals Group     BP 01/29/20 1233 112/80     Pulse Rate 01/29/20 1233 75     Resp 01/29/20 1233 16     Temp 01/29/20 1233 98.6 F (37 C)     Temp Source 01/29/20 1233 Oral     SpO2 01/29/20 1233 99 %     Weight --      Height --      Head Circumference --      Peak Flow --      Pain Score 01/29/20 1232 7     Pain Loc --      Pain Edu? --      Excl. in [place]? --    No data found.  Updated Vital Signs BP 112/80 (BP Location: Left Arm)   Pulse 75   Temp 98.6 F (37 C) (Oral)   Resp 16   LMP 05/03/2017 (Approximate)   SpO2 99%   Visual Acuity Right Eye Distance:   Left Eye Distance:   Bilateral Distance:    Right Eye Near:   Left Eye Near:    Bilateral Near:     Physical Exam Nursing notes and Vital Signs reviewed. Appearance:  Patient appears stated age, and in no acute distress Eyes:  Pupils are equal, round, and reactive to light and accomodation.  Extraocular movement is intact.  Conjunctivae are not inflamed  Ears:  Canals normal.  Tympanic membranes normal.  Nose:  Congested turbinates.  Maxillary sinus tenderness is present.  Pharynx:  Mildly erythematous Neck:  Supple.  Tender enlarged tonsillar nodes.  Lungs:  Clear to auscultation.  Breath sounds are equal.  Moving air well. Heart:  Regular rate and rhythm without murmurs, rubs, or gallops.  Abdomen:  Nontender without masses or hepatosplenomegaly.  Bowel sounds are present.  No CVA or flank tenderness.  Extremities:  No edema.  Skin:  No rash present.   UC Treatments / Results  Labs (all  labs ordered are listed, but only abnormal results are displayed) Labs Reviewed  STREP A DNA PROBE  POCT RAPID STREP A (OFFICE) negative    EKG  Radiology No results found.  Procedures Procedures (including critical care time)  Medications Ordered in UC Medications - No data to display  Initial Impression / Assessment and Plan / UC Course  I have reviewed the triage vital signs and the nursing notes.  Pertinent labs & imaging results that were available during my care of the patient were reviewed by me and considered in my medical decision making (see chart for details).    Relatively benign exam.  Because of sore throat persistence, will begin amoxicillin. Followup with Family Doctor if not improved in about two weeks. Final Clinical Impressions(s) / UC Diagnoses   Final diagnoses:  Acute pharyngitis, unspecified etiology  Viral URI with cough     Discharge Instructions     Take plain guaifenesin (1200mg  extended release tabs such as Mucinex) twice daily, with plenty of water, for cough and congestion.  Continue Pseudoephedrine (30mg , one or two every 4 to 6 hours) for sinus congestion.  Get adequate rest.   May use Afrin nasal spray (or generic oxymetazoline) each morning for about 5 days and then discontinue.  Also recommend using saline nasal spray several times daily and saline nasal irrigation (AYR is a common brand).  Use Flonase nasal spray each morning after using Afrin nasal spray and saline nasal irrigation. Try warm salt water gargles for sore throat.  Stop all antihistamines for now (Zyrtec, Nyquil, etc), and other non-prescription cough/cold preparations. May take Delsym Cough Suppressant ("12 Hour Cough Relief") at bedtime for nighttime cough.     ED Prescriptions    Medication Sig Dispense Auth. Provider   amoxicillin (AMOXIL) 875 MG tablet Take 1 tablet (875 mg total) by mouth 2 (two) times daily. 14 tablet Kandra Nicolas, MD        Kandra Nicolas, MD 02/02/20 1430

## 2020-01-29 NOTE — ED Triage Notes (Signed)
Patient presents to Urgent Care with complaints of sore throat since about 2 weeks ago. Patient reports she can see white patches at the back of her throat, has a cough as well. Had a rapid covid yesterday, did not have a strep test. Pt denies fevers.

## 2020-01-30 ENCOUNTER — Telehealth: Payer: BC Managed Care – PPO | Admitting: Osteopathic Medicine

## 2020-01-30 LAB — STREP A DNA PROBE: Group A Strep Probe: NOT DETECTED

## 2020-02-07 ENCOUNTER — Ambulatory Visit (INDEPENDENT_AMBULATORY_CARE_PROVIDER_SITE_OTHER): Payer: BC Managed Care – PPO | Admitting: Psychology

## 2020-02-07 DIAGNOSIS — F321 Major depressive disorder, single episode, moderate: Secondary | ICD-10-CM

## 2020-02-17 ENCOUNTER — Other Ambulatory Visit: Payer: Self-pay | Admitting: Osteopathic Medicine

## 2020-02-19 ENCOUNTER — Ambulatory Visit: Payer: BC Managed Care – PPO | Admitting: Psychology

## 2020-02-21 ENCOUNTER — Ambulatory Visit: Payer: BC Managed Care – PPO | Admitting: Psychology

## 2020-02-25 ENCOUNTER — Encounter: Payer: Self-pay | Admitting: Osteopathic Medicine

## 2020-02-25 MED ORDER — ESCITALOPRAM OXALATE 20 MG PO TABS
20.0000 mg | ORAL_TABLET | Freq: Every day | ORAL | 3 refills | Status: DC
Start: 2020-02-25 — End: 2021-03-22

## 2020-02-25 NOTE — Telephone Encounter (Signed)
Pt is requesting med refills for lexapro. Rx written by historical provider. Rx pended.

## 2020-03-06 ENCOUNTER — Ambulatory Visit: Payer: BC Managed Care – PPO | Admitting: Psychology

## 2020-03-20 ENCOUNTER — Ambulatory Visit: Payer: BC Managed Care – PPO | Admitting: Psychology

## 2020-03-22 ENCOUNTER — Encounter: Payer: Self-pay | Admitting: Osteopathic Medicine

## 2020-04-03 ENCOUNTER — Ambulatory Visit: Payer: BC Managed Care – PPO | Admitting: Psychology

## 2020-04-05 ENCOUNTER — Other Ambulatory Visit: Payer: Self-pay | Admitting: Osteopathic Medicine

## 2020-04-05 DIAGNOSIS — E063 Autoimmune thyroiditis: Secondary | ICD-10-CM

## 2020-04-24 ENCOUNTER — Encounter: Payer: Self-pay | Admitting: Osteopathic Medicine

## 2020-04-24 ENCOUNTER — Other Ambulatory Visit: Payer: Self-pay | Admitting: Osteopathic Medicine

## 2020-04-24 DIAGNOSIS — Z1159 Encounter for screening for other viral diseases: Secondary | ICD-10-CM | POA: Diagnosis not present

## 2020-04-24 MED ORDER — BUPROPION HCL ER (XL) 300 MG PO TB24
300.0000 mg | ORAL_TABLET | ORAL | 0 refills | Status: DC
Start: 2020-04-24 — End: 2020-05-12

## 2020-05-01 ENCOUNTER — Ambulatory Visit: Payer: BC Managed Care – PPO | Admitting: Psychology

## 2020-05-12 ENCOUNTER — Telehealth (INDEPENDENT_AMBULATORY_CARE_PROVIDER_SITE_OTHER): Payer: BC Managed Care – PPO | Admitting: Osteopathic Medicine

## 2020-05-12 ENCOUNTER — Encounter: Payer: Self-pay | Admitting: Osteopathic Medicine

## 2020-05-12 VITALS — BP 98/64 | Temp 98.7°F | Wt 142.2 lb

## 2020-05-12 DIAGNOSIS — R5382 Chronic fatigue, unspecified: Secondary | ICD-10-CM

## 2020-05-12 DIAGNOSIS — E038 Other specified hypothyroidism: Secondary | ICD-10-CM

## 2020-05-12 DIAGNOSIS — E063 Autoimmune thyroiditis: Secondary | ICD-10-CM

## 2020-05-12 DIAGNOSIS — F331 Major depressive disorder, recurrent, moderate: Secondary | ICD-10-CM | POA: Diagnosis not present

## 2020-05-12 DIAGNOSIS — Z0000 Encounter for general adult medical examination without abnormal findings: Secondary | ICD-10-CM

## 2020-05-12 MED ORDER — BUPROPION HCL ER (XL) 300 MG PO TB24
300.0000 mg | ORAL_TABLET | ORAL | 3 refills | Status: DC
Start: 2020-05-12 — End: 2021-01-14

## 2020-05-12 MED ORDER — BUPROPION HCL ER (XL) 300 MG PO TB24
300.0000 mg | ORAL_TABLET | ORAL | 0 refills | Status: DC
Start: 2020-05-12 — End: 2020-05-12

## 2020-05-12 MED ORDER — LEVOTHYROXINE SODIUM 75 MCG PO TABS
75.0000 ug | ORAL_TABLET | Freq: Every day | ORAL | 0 refills | Status: DC
Start: 2020-05-12 — End: 2020-07-08

## 2020-05-12 NOTE — Progress Notes (Signed)
Telemedicine Visit via  Video & Audio (App used: [email or web address])   I connected with Kara Cooper on 05/12/20 at 8:29 AM  by phone or  telemedicine application as noted above  I verified that I am speaking with or regarding  the correct patient using two identifiers.  Participants: Myself, Dr Emeterio Reeve DO Patient: Kara Cooper Patient proxy if applicable: none Other, if applicable: none  Patient is at home I am in office at [facility]    I discussed the limitations of evaluation and management  by telemedicine and the availability of in person appointments.  The participant(s) above expressed understanding and  agreed to proceed with this appointment via telemedicine.       History of Present Illness: Kara Cooper is a 55 y.o. female who would like to discuss check up on mental health - needs refill on Wellbutrin, doing really well on this Rx.     Depression screen [facility] 2/9 05/12/2020 12/24/2019 11/26/2019  Decreased Interest 0 0 1  Down, Depressed, Hopeless 0 1 1  PHQ - 2 Score 0 1 2  Altered sleeping - 3 3  Tired, decreased energy - 1 1  Change in appetite - 0 0  Feeling bad or failure about yourself  - 2 3  Trouble concentrating - 3 2  Moving slowly or fidgety/restless - 1 0  Suicidal thoughts - 0 0  PHQ-9 Score - 11 11  Difficult doing work/chores - Somewhat difficult Somewhat difficult   GAD 7 : Generalized Anxiety Score 05/12/2020 12/24/2019 11/26/2019 10/18/2018  Nervous, Anxious, on Edge 1 1 2  0  Control/stop worrying 2 1 3 1   Worry too much - different things 0 1 3 1   Trouble relaxing 0 2 1 2   Restless 0 2 1 0  Easily annoyed or irritable 1 3 2 1   Afraid - awful might happen 0 0 0 0  Total GAD 7 Score 4 10 12 5   Anxiety Difficulty - Somewhat difficult Very difficult Not difficult at all      Observations/Objective: BP 98/64   Temp 98.7 F (37.1 C)   Wt 142 lb 3.2 oz (64.5 kg)   LMP 05/03/2017 (Approximate)   BMI 26.01 kg/m  BP Readings  from Last 3 Encounters:  05/12/20 98/64  01/29/20 112/80  11/26/19 129/74   Exam: Normal Speech.  NAD  Lab and Radiology Results No results found for this or any previous visit (from the past 72 hour(s)). No results found.     Assessment and Plan: 55 y.o. 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 Diagnoses of Hypothyroidism due to Hashimoto's thyroiditis, Chronic fatigue, and Annual physical exam were also pertinent to this visit.  Labs ordered for future visit. Annual physical / preventive care was NOT performed or billed today.   PDMP not reviewed this encounter. Orders Placed This Encounter  Procedures  . CBC  . COMPLETE METABOLIC PANEL WITH GFR  . Lipid panel  . TSH   Meds ordered this encounter  Medications  . DISCONTD: buPROPion (WELLBUTRIN XL) 300 MG 24 hr tablet    Sig: Take 1 tablet (300 mg total) by mouth every morning.    Dispense:  30 tablet    Refill:  0  . levothyroxine (SYNTHROID) 75 MCG tablet    Sig: Take 1 tablet (75 mcg total) by mouth daily before breakfast.    Dispense:  90 tablet    Refill:  0  No refills. Needs labs for thyroid check before next refill.  Marland Kitchen buPROPion (WELLBUTRIN XL) 300 MG 24 hr tablet    Sig: Take 1 tablet (300 mg total) by mouth every morning.    Dispense:  90 tablet    Refill:  3    CANCEL #30 RX THANKS   There are no Patient Instructions on file for this visit.   Follow Up Instructions: Return in about 4 months (around 09/09/2020) for ANNUAL (labs 2+ days prior to appt, orders are in).    I discussed the assessment and treatment plan with the patient. The patient was provided an opportunity to ask questions and all were answered. The patient agreed with the plan and demonstrated an understanding of the instructions.   The patient was advised to call back or seek an in-person evaluation if any new concerns, if symptoms worsen or if the  condition fails to improve as anticipated.  10 minutes of non-face-to-face time was provided during this encounter.      . . . . . . . . . . . . . Marland Kitchen                   Historical information moved to improve visibility of documentation.  Past Medical History:  Diagnosis Date  . Acute right pyelonephritis 04/04/2017  . Anemia    in past  . Basal cell carcinoma    treated with Moh's procedure  . Chicken pox    in past  . Headache(784.0)    frequent  . History of seasonal allergies   . Irregular menses   . Migraine   . Other and unspecified ovarian cysts    come and go  . Postural orthostatic tachycardia syndrome    Dr. Burt Knack  . UTI (lower urinary tract infection)    history of   Past Surgical History:  Procedure Laterality Date  . CERVICAL CONIZATION W/BX  1987, 1988  . DILATION AND CURETTAGE OF UTERUS     Social History   Tobacco Use  . Smoking status: Never Smoker  . Smokeless tobacco: Never Used  Substance Use Topics  . Alcohol use: Yes    Comment: Occasional   family history includes Depression in her daughter; Diabetes in her daughter; Heart disease in her mother; Hyperlipidemia in her mother; Migraines in her daughter.  Medications: Current Outpatient Medications  Medication Sig Dispense Refill  . acetaminophen (TYLENOL) 500 MG tablet Take OTC as directed    . b complex vitamins tablet Take 1 tablet by mouth daily.    . Biotin 5000 MCG TABS Take by mouth.    Marland Kitchen buPROPion (WELLBUTRIN XL) 300 MG 24 hr tablet Take 1 tablet (300 mg total) by mouth every morning. 30 tablet 0  . Cyanocobalamin (B-12) 1000 MCG SUBL Place under the tongue daily.     Marland Kitchen escitalopram (LEXAPRO) 20 MG tablet Take 1 tablet (20 mg total) by mouth daily. 90 tablet 3  . fluticasone (FLONASE) 50 MCG/ACT nasal spray USE 2 SPRAYS IN EACH NOSTRIL DAILY AS NEEDED 16 g 11  . levothyroxine (SYNTHROID) 75 MCG tablet TAKE 1 TABLET (75 MCG TOTAL) BY MOUTH DAILY BEFORE  BREAKFAST. 90 tablet 0  . Multiple Vitamin (MULTIVITAMIN) tablet Take 1 tablet by mouth daily.      Marland Kitchen selenium 50 MCG TABS tablet Take 200 mcg by mouth daily.     . SUMAtriptan (IMITREX) 50 MG tablet Take 1 tablet (50 mg total) by mouth daily as needed for  migraine. Take one by mouth times one as needed for migraine- max one pill in a day. 10 tablet 5  . valACYclovir (VALTREX) 500 MG tablet Take 1 tablet (500 mg total) by mouth daily. 90 tablet 3  . amoxicillin (AMOXIL) 875 MG tablet Take 1 tablet (875 mg total) by mouth 2 (two) times daily. (Patient not taking: Reported on 05/12/2020) 14 tablet 0   No current facility-administered medications for this visit.   No Known Allergies   If phone visit, billing and coding can please add appropriate modifier if needed

## 2020-05-15 ENCOUNTER — Ambulatory Visit: Payer: BC Managed Care – PPO | Admitting: Psychology

## 2020-05-29 ENCOUNTER — Ambulatory Visit: Payer: BC Managed Care – PPO | Admitting: Psychology

## 2020-06-12 ENCOUNTER — Ambulatory Visit: Payer: BC Managed Care – PPO | Admitting: Psychology

## 2020-06-25 ENCOUNTER — Emergency Department (INDEPENDENT_AMBULATORY_CARE_PROVIDER_SITE_OTHER)
Admission: EM | Admit: 2020-06-25 | Discharge: 2020-06-25 | Disposition: A | Discharge status: Home / Self Care | Payer: BC Managed Care – PPO | Source: Home / Self Care

## 2020-06-25 ENCOUNTER — Other Ambulatory Visit: Payer: Self-pay

## 2020-06-25 DIAGNOSIS — J209 Acute bronchitis, unspecified: Secondary | ICD-10-CM

## 2020-06-25 DIAGNOSIS — R509 Fever, unspecified: Secondary | ICD-10-CM | POA: Diagnosis not present

## 2020-06-25 DIAGNOSIS — J069 Acute upper respiratory infection, unspecified: Secondary | ICD-10-CM | POA: Diagnosis not present

## 2020-06-25 MED ORDER — AZITHROMYCIN 250 MG PO TABS: 250.0000 mg | ORAL_TABLET | Freq: Every day | ORAL | 0 refills | Status: DC

## 2020-06-25 MED ORDER — PREDNISONE 10 MG (21) PO TBPK: ORAL_TABLET | Freq: Every day | ORAL | 0 refills | Status: AC

## 2020-06-25 MED ORDER — PROMETHAZINE-DM 6.25-15 MG/5ML PO SYRP: 5.0000 mL | ORAL_SOLUTION | Freq: Four times a day (QID) | ORAL | 0 refills | Status: DC | PRN

## 2020-06-25 MED ORDER — METHYLPREDNISOLONE SODIUM SUCC 125 MG IJ SOLR
125.0000 mg | Freq: Once | INTRAMUSCULAR | Status: AC
  Administered 2020-06-25: 125 mg via INTRAMUSCULAR

## 2020-06-25 NOTE — ED Triage Notes (Signed)
Pt c/o intermittent headache x 1 week. A couple days later developed cough, fever (tmax 99.9 this am) bodyaches, chills, green drainage. Works as a Marine scientist. Has been vaccinated. Taking dayquil/nyquil/ tylenol and motrin prn. Zyrtec as well.

## 2020-06-25 NOTE — ED Provider Notes (Signed)
[facility]   [id number] 06/25/20 Arrival Time: 4128   CC: COVID symptoms  SUBJECTIVE: History from: patient.  Shary Lamos is a 55 y.o. female who presents with headache, cough, fever, chills, fatigue, congestion intermittently x 1 week. Denies sick exposure to COVID, flu or strep. Denies recent travel. Has negative history of Covid. Has completed Covid vaccines. Has taken OTC cough and cold medications for this with mild temporary relief. Cough and fatigue are worse with activity. Denies previous symptoms in the past. Denies sinus pain, rhinorrhea, sore throat, SOB, wheezing, chest pain, nausea, changes in bowel or bladder habits.    ROS: As per HPI.  All other pertinent ROS negative.     Past Medical History:  Diagnosis Date  . Acute right pyelonephritis 04/04/2017  . Anemia    in past  . Basal cell carcinoma    treated with Moh's procedure  . Chicken pox    in past  . Headache(784.0)    frequent  . History of seasonal allergies   . Irregular menses   . Migraine   . Other and unspecified ovarian cysts    come and go  . Postural orthostatic tachycardia syndrome    Dr. Burt Knack  . UTI (lower urinary tract infection)    history of   Past Surgical History:  Procedure Laterality Date  . CERVICAL CONIZATION W/BX  1987, 1988  . DILATION AND CURETTAGE OF UTERUS     No Known Allergies No current facility-administered medications on file prior to encounter.   Current Outpatient Medications on File Prior to Encounter  Medication Sig Dispense Refill  . acetaminophen (TYLENOL) 500 MG tablet Take OTC as directed    . b complex vitamins tablet Take 1 tablet by mouth daily.    . Biotin 5000 MCG TABS Take by mouth.    Marland Kitchen buPROPion (WELLBUTRIN XL) 300 MG 24 hr tablet Take 1 tablet (300 mg total) by mouth every morning. 90 tablet 3  . Cyanocobalamin (B-12) 1000 MCG SUBL Place under the tongue daily.     Marland Kitchen escitalopram (LEXAPRO) 20 MG tablet Take 1 tablet (20 mg total) by  mouth daily. 90 tablet 3  . fluticasone (FLONASE) 50 MCG/ACT nasal spray USE 2 SPRAYS IN EACH NOSTRIL DAILY AS NEEDED 16 g 11  . levothyroxine (SYNTHROID) 75 MCG tablet Take 1 tablet (75 mcg total) by mouth daily before breakfast. 90 tablet 0  . Multiple Vitamin (MULTIVITAMIN) tablet Take 1 tablet by mouth daily.      Marland Kitchen selenium 50 MCG TABS tablet Take 200 mcg by mouth daily.     . SUMAtriptan (IMITREX) 50 MG tablet Take 1 tablet (50 mg total) by mouth daily as needed for migraine. Take one by mouth times one as needed for migraine- max one pill in a day. 10 tablet 5  . valACYclovir (VALTREX) 500 MG tablet Take 1 tablet (500 mg total) by mouth daily. 90 tablet 3   Social History   Socioeconomic History  . Marital status: Single    Spouse name: Not on file  . Number of children: 2  . Years of education: Not on file  . Highest education level: Not on file  Occupational History  . Occupation: Therapist, sports  Tobacco Use  . Smoking status: Never Smoker  . Smokeless tobacco: Never Used  Substance and Sexual Activity  . Alcohol use: Yes    Comment: Occasional  . Drug use: No  . Sexual activity: Not on file  Other Topics Concern  .  Not on file  Social History Narrative   Student.  Exercises every day, also cycles.   Social Determinants of Health   Financial Resource Strain: Not on file  Food Insecurity: Not on file  Transportation Needs: Not on file  Physical Activity: Not on file  Stress: Not on file  Social Connections: Not on file  Intimate Partner Violence: Not on file   Family History  Problem Relation Age of Onset  . Hyperlipidemia Mother   . Heart disease Mother        A-fib, ablation  . Migraines Daughter   . Diabetes Daughter        type I  . Depression Daughter        bipolar    OBJECTIVE:  Vitals:   06/25/20 1344  BP: 114/76  Pulse: 97  Resp: 18  Temp: (!) 97.5 F (36.4 C)  TempSrc: Oral  SpO2: 100%     General appearance: alert; appears fatigued, but  nontoxic; speaking in full sentences and tolerating own secretions HEENT: NCAT; Ears: EACs clear, TMs pearly gray; Eyes: PERRL.  EOM grossly intact. Sinuses: nontender; Nose: nares patent with clear rhinorrhea, Throat: oropharynx erythematous, cobblestoning present, tonsils non erythematous or enlarged, uvula midline  Neck: supple without LAD Lungs: unlabored respirations, symmetrical air entry; cough: mild; no respiratory distress; mild wheezing to bilateral lower lobes Heart: regular rate and rhythm.  Radial pulses 2+ symmetrical bilaterally Skin: warm and dry Psychological: alert and cooperative; normal mood and affect  LABS:  No results found for this or any previous visit (from the past 24 hour(s)).   ASSESSMENT & PLAN:  1. Acute bronchitis, unspecified organism   2. Fever, unspecified fever cause   3. URI with cough and congestion     Meds ordered this encounter  Medications  . methylPREDNISolone sodium succinate (SOLU-MEDROL) 125 mg/2 mL injection 125 mg  . azithromycin (ZITHROMAX) 250 MG tablet    Sig: Take 1 tablet (250 mg total) by mouth daily. Take first 2 tablets together, then 1 every day until finished.    Dispense:  6 tablet    Refill:  0    Order Specific Question:   Supervising Provider    Answer:   Chase Picket [id number]  . predniSONE (STERAPRED UNI-PAK 21 TAB) 10 MG (21) TBPK tablet    Sig: Take by mouth daily for 6 days. Take 6 tablets on day 1, 5 tablets on day 2, 4 tablets on day 3, 3 tablets on day 4, 2 tablets on day 5, 1 tablet on day 6    Dispense:  21 tablet    Refill:  0    Order Specific Question:   Supervising Provider    Answer:   Chase Picket [id number]  . promethazine-dextromethorphan (PROMETHAZINE-DM) 6.25-15 MG/5ML syrup    Sig: Take 5 mLs by mouth 4 (four) times daily as needed for cough.    Dispense:  118 mL    Refill:  0    Order Specific Question:   Supervising Provider    Answer:   Chase Picket [id number]     Solumedrol  125mg  IM in office today Steroid taper prescribed, start tomorrow Z-pack prescribed Promethazine cough syrup prescribed Sedation precautions given Continue supportive care at home COVID and flu testing ordered.  It will take between 2-3 days for test results. Someone will contact you regarding abnormal results.   Work note provided Patient should remain in quarantine until they have received Covid results.  If negative  you may resume normal activities (go back to work/school) while practicing hand hygiene, social distance, and mask wearing.  If positive, patient should remain in quarantine for at least 5 days from symptom onset AND greater than 72 hours after symptoms resolution (absence of fever without the use of fever-reducing medication and improvement in respiratory symptoms), whichever is longer Get plenty of rest and push fluids Use OTC zyrtec for nasal congestion, runny nose, and/or sore throat Use OTC flonase for nasal congestion and runny nose Use medications daily for symptom relief Use OTC medications like ibuprofen or tylenol as needed fever or pain Call or go to the ED if you have any new or worsening symptoms such as fever, worsening cough, shortness of breath, chest tightness, chest pain, turning blue, changes in mental status.  Reviewed expectations re: course of current medical issues. Questions answered. Outlined signs and symptoms indicating need for more acute intervention. Patient verbalized understanding. After Visit Summary given.         Faustino Congress, NP 06/25/20 1407

## 2020-06-25 NOTE — Discharge Instructions (Signed)
You have received a steroid injection in the office to help open you up so you can breathe easier  I have sent in azithromycin for you to take. Take 2 tablets today, then one tablet daily for the next 4 days.  I have sent in a prednisone taper for you to take for 6 days. 6 tablets on day one, 5 tablets on day two, 4 tablets on day three, 3 tablets on day four, 2 tablets on day five, and 1 tablet on day six.  I have sent in cough syrup for you to take. This medication can make you sleepy. Do not drive while taking this medication.  Your COVID test is pending.  You should self quarantine until the test result is back.    Take Tylenol or ibuprofen as needed for fever or discomfort.  Rest and keep yourself hydrated.    Follow-up with your primary care provider if your symptoms are not improving.

## 2020-06-26 ENCOUNTER — Ambulatory Visit: Payer: BC Managed Care – PPO | Admitting: Psychology

## 2020-06-27 LAB — COVID-19, FLU A+B NAA
Influenza A, NAA: NOT DETECTED
Influenza B, NAA: NOT DETECTED
SARS-CoV-2, NAA: NOT DETECTED

## 2020-06-30 IMAGING — DX DG CHEST 2V
2 series · 2 of 2 positions shown · non-contrast
Comparison: None.

CLINICAL DATA: Shortness of breath and chest pain

EXAM:
CHEST - 2 VIEW

[chest pa]
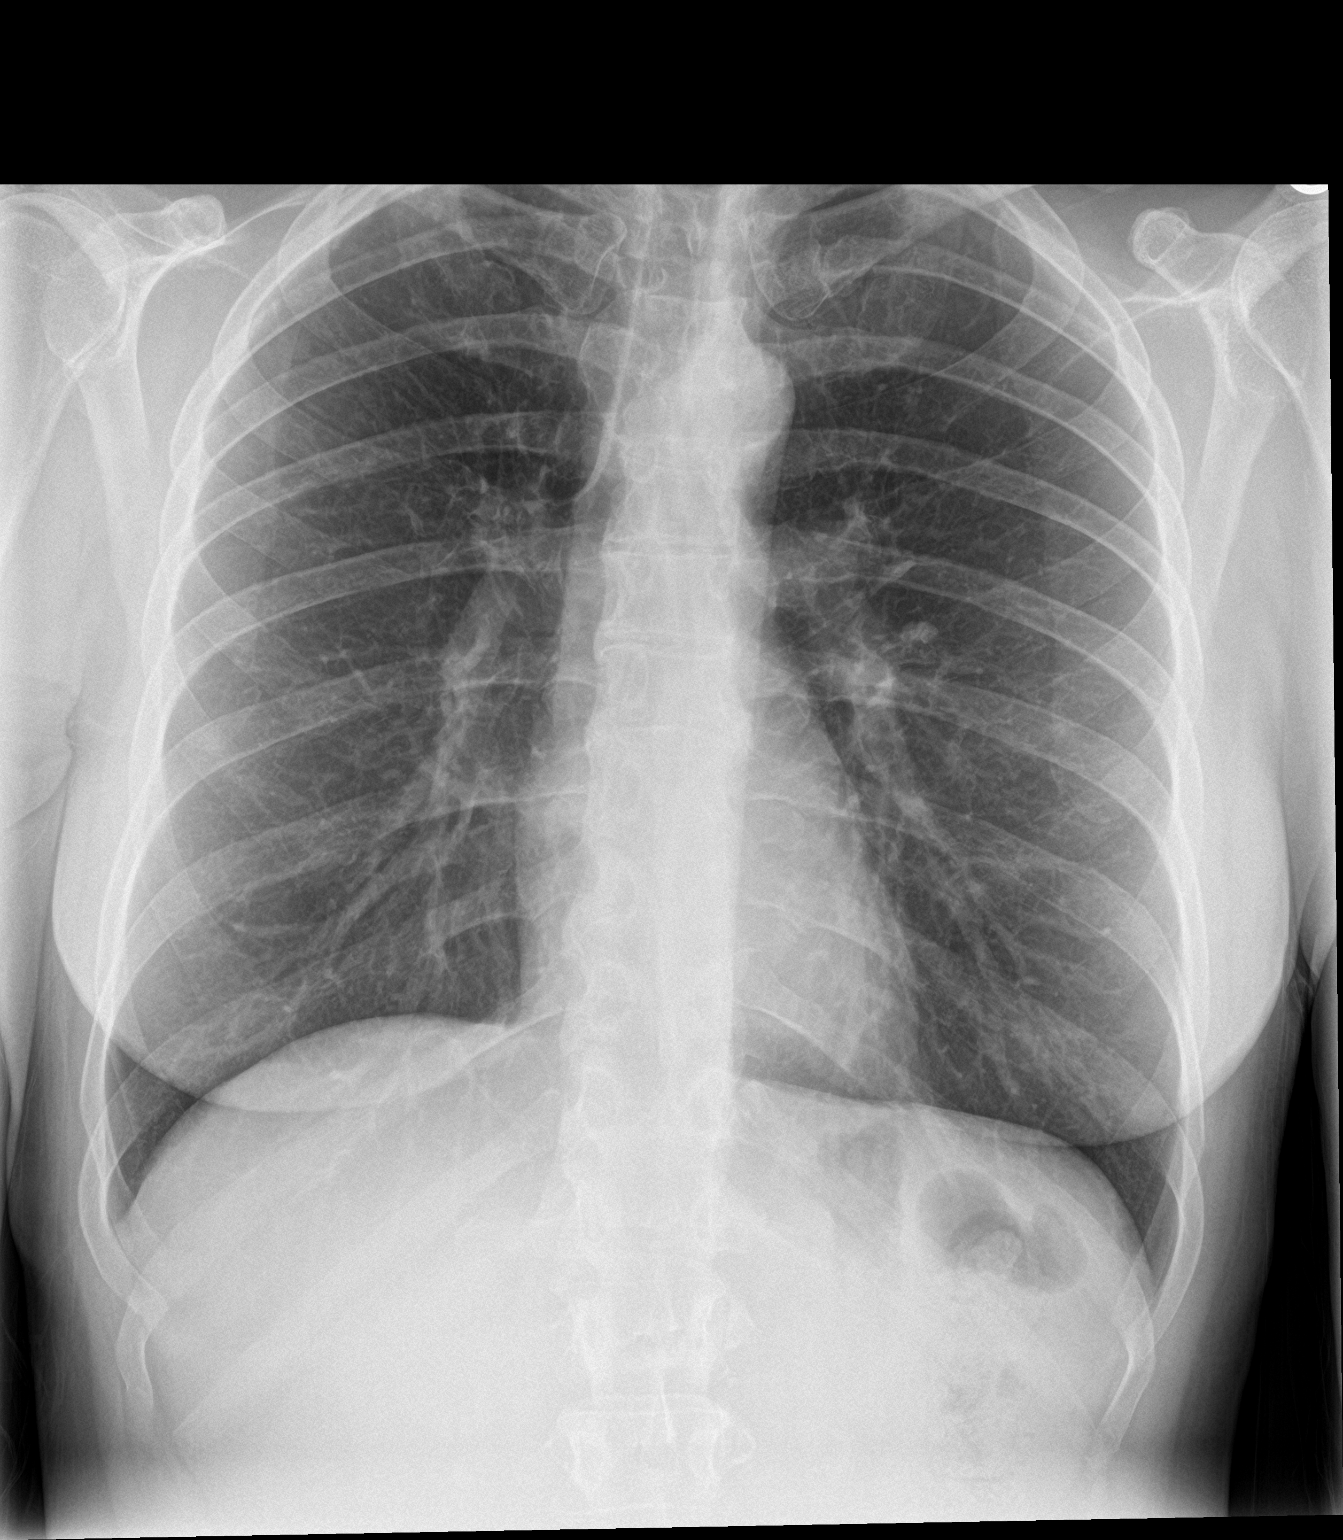

[chest lat]
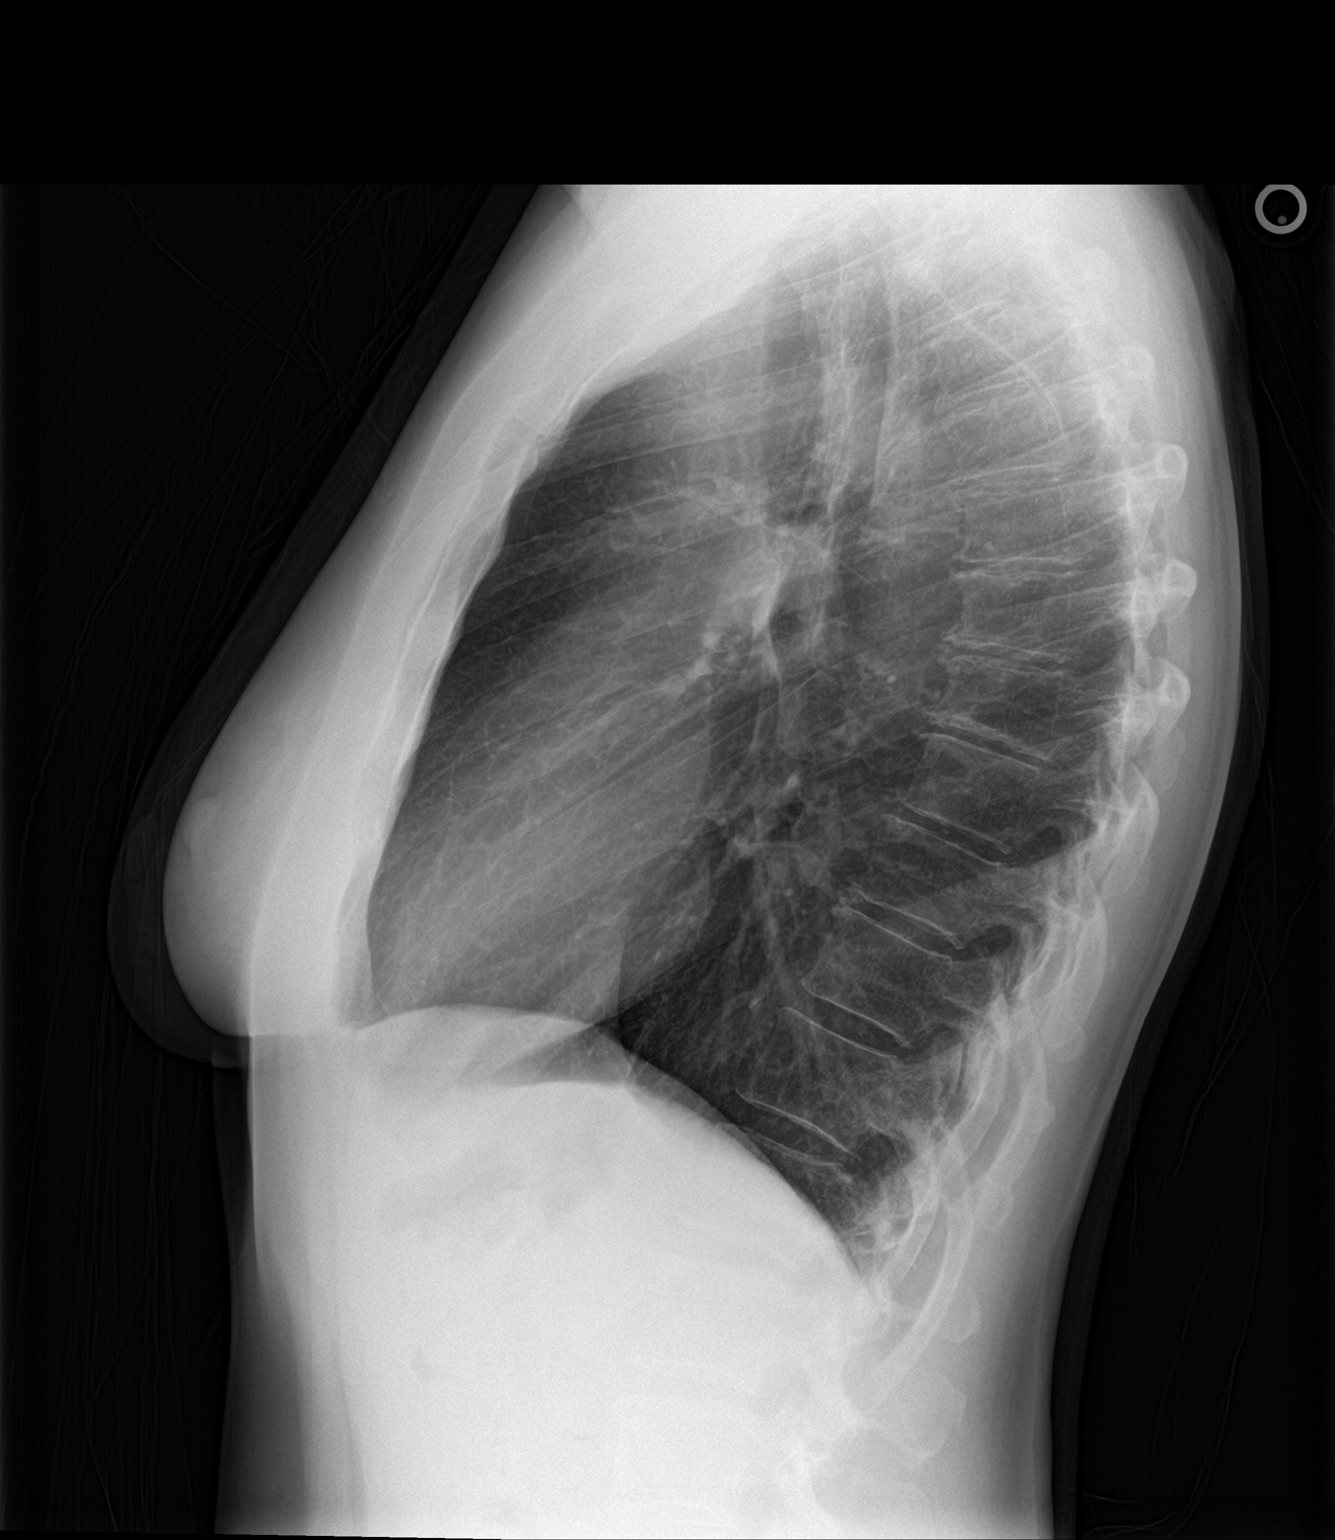

[2 of 2 positions shown; findings below may reference images not displayed]

FINDINGS: Lungs are clear. The heart size and pulmonary vascularity are
normal. No adenopathy. No pneumothorax. There is mild degenerative
change in the thoracic spine.
IMPRESSION: No edema or consolidation.

## 2020-07-08 ENCOUNTER — Other Ambulatory Visit: Payer: Self-pay | Admitting: Osteopathic Medicine

## 2020-07-10 ENCOUNTER — Ambulatory Visit: Payer: BC Managed Care – PPO | Admitting: Psychology

## 2020-07-15 ENCOUNTER — Telehealth: Payer: Self-pay

## 2020-07-15 NOTE — Telephone Encounter (Signed)
Sharnika called and left a message asking about appointment. I called and left a message for a return call.

## 2020-07-24 ENCOUNTER — Ambulatory Visit: Payer: BC Managed Care – PPO | Admitting: Psychology

## 2020-08-27 ENCOUNTER — Encounter: Payer: BC Managed Care – PPO | Admitting: Osteopathic Medicine

## 2020-10-20 ENCOUNTER — Other Ambulatory Visit: Payer: Self-pay | Admitting: Osteopathic Medicine

## 2020-12-13 ENCOUNTER — Encounter: Payer: Self-pay | Admitting: Osteopathic Medicine

## 2020-12-13 DIAGNOSIS — Z0000 Encounter for general adult medical examination without abnormal findings: Secondary | ICD-10-CM

## 2020-12-13 DIAGNOSIS — F1011 Alcohol abuse, in remission: Secondary | ICD-10-CM

## 2020-12-13 DIAGNOSIS — E038 Other specified hypothyroidism: Secondary | ICD-10-CM

## 2020-12-30 ENCOUNTER — Encounter: Payer: Self-pay | Admitting: Osteopathic Medicine

## 2021-01-02 LAB — CBC
HCT: 39.1 % (ref 35.0–45.0)
Hemoglobin: 13.1 g/dL (ref 11.7–15.5)
MCH: 32.8 pg (ref 27.0–33.0)
MCHC: 33.5 g/dL (ref 32.0–36.0)
MCV: 98 fL (ref 80.0–100.0)
MPV: 11.1 fL (ref 7.5–12.5)
Platelets: 219 10*3/uL (ref 140–400)
RBC: 3.99 10*6/uL (ref 3.80–5.10)
RDW: 11.6 % (ref 11.0–15.0)
WBC: 3.9 10*3/uL (ref 3.8–10.8)

## 2021-01-02 LAB — LIPID PANEL
Cholesterol: 189 mg/dL (ref <200)
HDL: 46 mg/dL — ABNORMAL LOW (ref >=50)
LDL Cholesterol (Calc): 121 mg/dL (calc) — ABNORMAL HIGH
Non-HDL Cholesterol (Calc): 143 mg/dL (calc) — ABNORMAL HIGH (ref <130)
Total CHOL/HDL Ratio: 4.1 (calc) (ref <5.0)
Triglycerides: 113 mg/dL (ref <150)

## 2021-01-02 LAB — COMPLETE METABOLIC PANEL WITH GFR
AG Ratio: 1.8 (calc) (ref 1.0–2.5)
ALT: 12 U/L (ref 6–29)
AST: 15 U/L (ref 10–35)
Albumin: 4.4 g/dL (ref 3.6–5.1)
Alkaline phosphatase (APISO): 45 U/L (ref 37–153)
BUN: 10 mg/dL (ref 7–25)
CO2: 29 mmol/L (ref 20–32)
Calcium: 9.5 mg/dL (ref 8.6–10.4)
Chloride: 106 mmol/L (ref 98–110)
Creat: 0.86 mg/dL (ref 0.50–1.03)
Globulin: 2.5 g/dL (calc) (ref 1.9–3.7)
Glucose, Bld: 75 mg/dL (ref 65–99)
Potassium: 4.3 mmol/L (ref 3.5–5.3)
Sodium: 142 mmol/L (ref 135–146)
Total Bilirubin: 0.5 mg/dL (ref 0.2–1.2)
Total Protein: 6.9 g/dL (ref 6.1–8.1)
eGFR: 81 mL/min/{1.73_m2} (ref >=60)

## 2021-01-02 LAB — VITAMIN B1: Vitamin B1 (Thiamine): 57 nmol/L — ABNORMAL HIGH (ref 8–30)

## 2021-01-02 LAB — TSH: TSH: 2.41 mIU/L

## 2021-01-06 ENCOUNTER — Ambulatory Visit (INDEPENDENT_AMBULATORY_CARE_PROVIDER_SITE_OTHER): Payer: Managed Care, Other (non HMO) | Admitting: Osteopathic Medicine

## 2021-01-06 ENCOUNTER — Encounter: Payer: Self-pay | Admitting: Osteopathic Medicine

## 2021-01-06 VITALS — BP 91/52 | HR 82 | Temp 98.0°F | Ht 62.0 in | Wt 142.0 lb

## 2021-01-06 DIAGNOSIS — F1011 Alcohol abuse, in remission: Secondary | ICD-10-CM | POA: Diagnosis not present

## 2021-01-06 DIAGNOSIS — E038 Other specified hypothyroidism: Secondary | ICD-10-CM

## 2021-01-06 DIAGNOSIS — Z23 Encounter for immunization: Secondary | ICD-10-CM | POA: Diagnosis not present

## 2021-01-06 DIAGNOSIS — Z0000 Encounter for general adult medical examination without abnormal findings: Secondary | ICD-10-CM

## 2021-01-06 DIAGNOSIS — E063 Autoimmune thyroiditis: Secondary | ICD-10-CM

## 2021-01-06 NOTE — Patient Instructions (Addendum)
General Preventive Care Most recent routine screening labs: see attached.  Blood pressure goal 130/80 or less.  Tobacco: don't!  Alcohol: responsible moderation is ok for most adults BUT  - if you have concerns about your alcohol intake, please talk to me!  Exercise: as tolerated to reduce risk of cardiovascular disease and diabetes. Strength training will also prevent osteoporosis.  Mental health: if need for mental health care (medicines, counseling, other), or concerns about moods, please let me know!  Sexual / Reproductive health: if need for STD testing, or if concerns with libido/pain problems, please let me know! If you need to discuss family planning, please let me know!  Advanced Directive: Living Will and/or Healthcare Power of Attorney recommended for all adults, regardless of age or health.  Vaccines Flu vaccine: done today! Shingles vaccine: after age 88.  Pneumonia vaccines: after age 89. Tetanus booster: every 10 years, you've reported past one done 2014 but we don' have an official record. Boosters also given in 3rd trimester of pregnancy COVID vaccine: THANKS for getting your vaccine! :) FALL BOOSTER STRONGLY RECOMMENDED  Cancer screenings  Colon cancer screening: for everyone age 44-75. Colonoscopy available for all, many people also qualify for the Cologuard stool test  Breast cancer screening: mammogram as scheduled Cervical cancer screening: Pap per OBGYN Lung cancer screening: not needed for non-smokers  Infection screenings  HIV: recommended screening at least once age 85-65, more often as needed. Gonorrhea/Chlamydia: screening as needed Hepatitis C: recommended once for everyone age 71-24 TB: certain at-risk populations Other Bone Density Test: recommended for women at age 50     Recombinant Zoster (Shingles) Vaccine: What You Need to Know 1. Why get vaccinated? Recombinant zoster (shingles) vaccine can prevent shingles. Shingles (also called herpes zoster,  or just zoster) is a painful skin rash, usually with blisters. In addition to the rash, shingles can cause fever, headache, chills, or upset stomach. Rarely, shingles can lead to complications such as pneumonia, hearing problems, blindness, brain inflammation (encephalitis), or death. The risk of shingles increases with age. The most common complication of shingles is long-term nerve pain called postherpetic neuralgia (PHN). PHN occurs in the areas where the shingles rash was and can last for months or years after the rash goes away. The pain from PHN can be severe and debilitating. The risk of PHN increases with age. An older adult with shingles is more likely to develop PHN and have longer lasting and more severe pain than a younger person. People with weakened immune systems also have a higher risk of getting shingles and complications from the disease. Shingles is caused by varicella-zoster virus, the same virus that causes chickenpox. After you have chickenpox, the virus stays in your body and can cause shingles later in life. Shingles cannot be passed from one person to another, but the virus that causes shingles can spread and cause chickenpox in someone who has never had chickenpox or has never received chickenpox vaccine. 2. Recombinant shingles vaccine Recombinant shingles vaccine provides strong protection against shingles. By preventing shingles, recombinant shingles vaccine also protects against PHN and other complications. Recombinant shingles vaccine is recommended for: Adults 33 years and older Adults 19 years and older who have a weakened immune system because of disease or treatments Shingles vaccine is given as a two-dose series. For most people, the second dose should be given 2 to 6 months after the first dose. Some people who have or will have a weakened immune system can get the second dose  1 to 2 months after the first dose. Ask your health care provider for guidance. People who  have had shingles in the past and people who have received varicella (chickenpox) vaccine are recommended to get recombinant shingles vaccine. The vaccine is also recommended for people who have already gotten another type of shingles vaccine, the live shingles vaccine. There is no live virus in recombinant shingles vaccine. Shingles vaccine may be given at the same time as other vaccines. 3. Talk with your health care provider Tell your vaccination provider if the person getting the vaccine: Has had an allergic reaction after a previous dose of recombinant shingles vaccine, or has any severe, life-threatening allergies Is currently experiencing an episode of shingles Is pregnant In some cases, your health care provider may decide to postpone shingles vaccination until a future visit. People with minor illnesses, such as a cold, may be vaccinated. People who are moderately or severely ill should usually wait until they recover before getting recombinant shingles vaccine. Your health care provider can give you more information. 4. Risks of a vaccine reaction A sore arm with mild or moderate pain is very common after recombinant shingles vaccine. Redness and swelling can also happen at the site of the injection. Tiredness, muscle pain, headache, shivering, fever, stomach pain, and nausea are common after recombinant shingles vaccine. These side effects may temporarily prevent a vaccinated person from doing regular activities. Symptoms usually go away on their own in 2 to 3 days. You should still get the second dose of recombinant shingles vaccine even if you had one of these reactions after the first dose. Guillain-Barr syndrome (GBS), a serious nervous system disorder, has been reported very rarely after recombinant zoster vaccine. People sometimes faint after medical procedures, including vaccination. Tell your provider if you feel dizzy or have vision changes or ringing in the ears. As with any  medicine, there is a very remote chance of a vaccine causing a severe allergic reaction, other serious injury, or death. 5. What if there is a serious problem? An allergic reaction could occur after the vaccinated person leaves the clinic. If you see signs of a severe allergic reaction (hives, swelling of the face and throat, difficulty breathing, a fast heartbeat, dizziness, or weakness), call 9-1-1 and get the person to the nearest hospital. For other signs that concern you, call your health care provider. Adverse reactions should be reported to the Vaccine Adverse Event Reporting System (VAERS). Your health care provider will usually file this report, or you can do it yourself. Visit the VAERS website at www.vaers.SamedayNews.es or call 202-736-0879. VAERS is only for reporting reactions, and VAERS staff members do not give medical advice. 6. How can I learn more? Ask your health care provider. Call your local or state health department. Visit the website of the Food and Drug Administration (FDA) for vaccine package inserts and additional information at http://lopez-wang.org/. Contact the Centers for Disease Control and Prevention (CDC): Call (519)884-0316 (1-800-CDC-INFO) or Visit CDC's website at http://hunter.com/. Vaccine Information Statement Recombinant Zoster Vaccine (05/22/2020) This information is not intended to replace advice given to you by your health care provider. Make sure you discuss any questions you have with your health care provider. Document Revised: 06/05/2020 Document Reviewed: 06/05/2020 Elsevier Patient Education  [employer].   Influenza (Flu) Vaccine (Inactivated or Recombinant): What You Need to Know 1. Why get vaccinated? Influenza vaccine can prevent influenza (flu). Flu is a contagious disease that spreads around the [place] every year, usually  between October and May. Anyone can get the flu, but it is more dangerous for some  people. Infants and young children, people 30 years and older, pregnant people, and people with certain health conditions or a weakened immune system are at greatest risk of flu complications. Pneumonia, bronchitis, sinus infections, and ear infections are examples of flu-related complications. If you have a medical condition, such as heart disease, cancer, or diabetes, flu can make it worse. Flu can cause fever and chills, sore throat, muscle aches, fatigue, cough, headache, and runny or stuffy nose. Some people may have vomiting and diarrhea, though this is more common in children than adults. In an average year, thousands of people in the [place] die from flu, and many more are hospitalized. Flu vaccine prevents millions of illnesses and flu-related visits to the doctor each year. 2. Influenza vaccines CDC recommends everyone 6 months and older get vaccinated every flu season. Children 6 months through 39 years of age may need 2 doses during a single flu season. Everyone else needs only 1 dose each flu season. It takes about 2 weeks for protection to develop after vaccination. There are many flu viruses, and they are always changing. Each year a new flu vaccine is made to protect against the influenza viruses believed to be likely to cause disease in the upcoming flu season. Even when the vaccine doesn't exactly match these viruses, it may still provide some protection. Influenza vaccine does not cause flu. Influenza vaccine may be given at the same time as other vaccines. 3. Talk with your health care provider Tell your vaccination provider if the person getting the vaccine: Has had an allergic reaction after a previous dose of influenza vaccine, or has any severe, life-threatening allergies Has ever had Guillain-Barr Syndrome (also called "GBS") In some cases, your health care provider may decide to postpone influenza vaccination until a future visit. Influenza vaccine can be administered  at any time during pregnancy. People who are or will be pregnant during influenza season should receive inactivated influenza vaccine. People with minor illnesses, such as a cold, may be vaccinated. People who are moderately or severely ill should usually wait until they recover before getting influenza vaccine. Your health care provider can give you more information. 4. Risks of a vaccine reaction Soreness, redness, and swelling where the shot is given, fever, muscle aches, and headache can happen after influenza vaccination. There may be a very small increased risk of Guillain-Barr Syndrome (GBS) after inactivated influenza vaccine (the flu shot). Young children who get the flu shot along with pneumococcal vaccine (PCV13) and/or DTaP vaccine at the same time might be slightly more likely to have a seizure caused by fever. Tell your health care provider if a child who is getting flu vaccine has ever had a seizure. People sometimes faint after medical procedures, including vaccination. Tell your provider if you feel dizzy or have vision changes or ringing in the ears. As with any medicine, there is a very remote chance of a vaccine causing a severe allergic reaction, other serious injury, or death. 5. What if there is a serious problem? An allergic reaction could occur after the vaccinated person leaves the clinic. If you see signs of a severe allergic reaction (hives, swelling of the face and throat, difficulty breathing, a fast heartbeat, dizziness, or weakness), call 9-1-1 and get the person to the nearest hospital. For other signs that concern you, call your health care provider. Adverse reactions should be reported to the  Vaccine Adverse Event Reporting System (VAERS). Your health care provider will usually file this report, or you can do it yourself. Visit the VAERS website at www.vaers.SamedayNews.es or call (657)597-1490. VAERS is only for reporting reactions, and VAERS staff members do not give  medical advice. 6. The National Vaccine Injury Compensation Program The [employer] Vaccine Injury Compensation Program (VICP) is a federal program that was created to compensate people who may have been injured by certain vaccines. Claims regarding alleged injury or death due to vaccination have a time limit for filing, which may be as short as two years. Visit the VICP website at GoldCloset.com.ee or call (360)154-6302 to learn about the program and about filing a claim. 7. How can I learn more? Ask your health care provider. Call your local or state health department. Visit the website of the Food and Drug Administration (FDA) for vaccine package inserts and additional information at TraderRating.uy. Contact the Centers for Disease Control and Prevention (CDC): Call 9861187699 (1-800-CDC-INFO) or Visit CDC's website at https://gibson.com/. Vaccine Information Statement Inactivated Influenza Vaccine (11/22/2019) This information is not intended to replace advice given to you by your health care provider. Make sure you discuss any questions you have with your health care provider. Document Revised: 01/09/2020 Document Reviewed: 01/09/2020 Elsevier Patient Education  2022 [employer].

## 2021-01-06 NOTE — Progress Notes (Signed)
Kara Cooper is a 55 y.o. female who presents to  [facility]  today, 01/06/21, seeking care for the following:  ANual physical Doing well, 40+ days sober! Phebe Colla!  Feeling tired more than usual      ASSESSMENT & PLAN with other pertinent findings:  The primary encounter diagnosis was Annual physical exam. Diagnoses of Need for influenza vaccination, Hypothyroidism due to Hashimoto's thyroiditis, Alcohol abuse, in remission, and Need for shingles vaccine were also pertinent to this visit.   DOing well overall, staing sober, AA has been helpful Trial decrease Lexapro from 20 to 10 mg, cosndier switch SSRI or stop altogether depending how she's doing next visit   Patient Instructions   General Preventive Care Most recent routine screening labs: see attached.  Blood pressure goal 130/80 or less.  Tobacco: don't!  Alcohol: responsible moderation is ok for most adults BUT  - if you have concerns about your alcohol intake, please talk to me!  Exercise: as tolerated to reduce risk of cardiovascular disease and diabetes. Strength training will also prevent osteoporosis.  Mental health: if need for mental health care (medicines, counseling, other), or concerns about moods, please let me know!  Sexual / Reproductive health: if need for STD testing, or if concerns with libido/pain problems, please let me know! If you need to discuss family planning, please let me know!  Advanced Directive: Living Will and/or Healthcare Power of Attorney recommended for all adults, regardless of age or health.  Vaccines Flu vaccine: done today! Shingles vaccine: after age 10.  Pneumonia vaccines: after age 60. Tetanus booster: every 10 years, you've reported past one done 2014 but we don' have an official record. Boosters also given in 3rd trimester of pregnancy COVID vaccine: THANKS for getting your vaccine! :) FALL BOOSTER STRONGLY RECOMMENDED  Cancer  screenings  Colon cancer screening: for everyone age 12-75. Colonoscopy available for all, many people also qualify for the Cologuard stool test  Breast cancer screening: mammogram as scheduled Cervical cancer screening: Pap per OBGYN Lung cancer screening: not needed for non-smokers  Infection screenings  HIV: recommended screening at least once age 71-65, more often as needed. Gonorrhea/Chlamydia: screening as needed Hepatitis C: recommended once for everyone age 00-71 TB: certain at-risk populations Other Bone Density Test: recommended for women at age 68     Recombinant Zoster (Shingles) Vaccine: What You Need to Know 1. Why get vaccinated? Recombinant zoster (shingles) vaccine can prevent shingles. Shingles (also called herpes zoster, or just zoster) is a painful skin rash, usually with blisters. In addition to the rash, shingles can cause fever, headache, chills, or upset stomach. Rarely, shingles can lead to complications such as pneumonia, hearing problems, blindness, brain inflammation (encephalitis), or death. The risk of shingles increases with age. The most common complication of shingles is long-term nerve pain called postherpetic neuralgia (PHN). PHN occurs in the areas where the shingles rash was and can last for months or years after the rash goes away. The pain from PHN can be severe and debilitating. The risk of PHN increases with age. An older adult with shingles is more likely to develop PHN and have longer lasting and more severe pain than a younger person. People with weakened immune systems also have a higher risk of getting shingles and complications from the disease. Shingles is caused by varicella-zoster virus, the same virus that causes chickenpox. After you have chickenpox, the virus stays in your body and can cause shingles later  in life. Shingles cannot be passed from one person to another, but the virus that causes shingles can spread and cause chickenpox in  someone who has never had chickenpox or has never received chickenpox vaccine. 2. Recombinant shingles vaccine Recombinant shingles vaccine provides strong protection against shingles. By preventing shingles, recombinant shingles vaccine also protects against PHN and other complications. Recombinant shingles vaccine is recommended for: Adults 23 years and older Adults 19 years and older who have a weakened immune system because of disease or treatments Shingles vaccine is given as a two-dose series. For most people, the second dose should be given 2 to 6 months after the first dose. Some people who have or will have a weakened immune system can get the second dose 1 to 2 months after the first dose. Ask your health care provider for guidance. People who have had shingles in the past and people who have received varicella (chickenpox) vaccine are recommended to get recombinant shingles vaccine. The vaccine is also recommended for people who have already gotten another type of shingles vaccine, the live shingles vaccine. There is no live virus in recombinant shingles vaccine. Shingles vaccine may be given at the same time as other vaccines. 3. Talk with your health care provider Tell your vaccination provider if the person getting the vaccine: Has had an allergic reaction after a previous dose of recombinant shingles vaccine, or has any severe, life-threatening allergies Is currently experiencing an episode of shingles Is pregnant In some cases, your health care provider may decide to postpone shingles vaccination until a future visit. People with minor illnesses, such as a cold, may be vaccinated. People who are moderately or severely ill should usually wait until they recover before getting recombinant shingles vaccine. Your health care provider can give you more information. 4. Risks of a vaccine reaction A sore arm with mild or moderate pain is very common after recombinant shingles vaccine.  Redness and swelling can also happen at the site of the injection. Tiredness, muscle pain, headache, shivering, fever, stomach pain, and nausea are common after recombinant shingles vaccine. These side effects may temporarily prevent a vaccinated person from doing regular activities. Symptoms usually go away on their own in 2 to 3 days. You should still get the second dose of recombinant shingles vaccine even if you had one of these reactions after the first dose. Guillain-Barr syndrome (GBS), a serious nervous system disorder, has been reported very rarely after recombinant zoster vaccine. People sometimes faint after medical procedures, including vaccination. Tell your provider if you feel dizzy or have vision changes or ringing in the ears. As with any medicine, there is a very remote chance of a vaccine causing a severe allergic reaction, other serious injury, or death. 5. What if there is a serious problem? An allergic reaction could occur after the vaccinated person leaves the clinic. If you see signs of a severe allergic reaction (hives, swelling of the face and throat, difficulty breathing, a fast heartbeat, dizziness, or weakness), call 9-1-1 and get the person to the nearest hospital. For other signs that concern you, call your health care provider. Adverse reactions should be reported to the Vaccine Adverse Event Reporting System (VAERS). Your health care provider will usually file this report, or you can do it yourself. Visit the VAERS website at www.vaers.SamedayNews.es or call 445-357-1599. VAERS is only for reporting reactions, and VAERS staff members do not give medical advice. 6. How can I learn more? Ask your health care provider. Call your  local or state health department. Visit the website of the Food and Drug Administration (FDA) for vaccine package inserts and additional information at http://lopez-wang.org/. Contact the Centers for Disease Control and Prevention  (CDC): Call 919 230 4950 (1-800-CDC-INFO) or Visit CDC's website at http://hunter.com/. Vaccine Information Statement Recombinant Zoster Vaccine (05/22/2020) This information is not intended to replace advice given to you by your health care provider. Make sure you discuss any questions you have with your health care provider. Document Revised: 06/05/2020 Document Reviewed: 06/05/2020 Elsevier Patient Education  [place].   Influenza (Flu) Vaccine (Inactivated or Recombinant): What You Need to Know 1. Why get vaccinated? Influenza vaccine can prevent influenza (flu). Flu is a contagious disease that spreads around the [place] every year, usually between October and May. Anyone can get the flu, but it is more dangerous for some people. Infants and young children, people 19 years and older, pregnant people, and people with certain health conditions or a weakened immune system are at greatest risk of flu complications. Pneumonia, bronchitis, sinus infections, and ear infections are examples of flu-related complications. If you have a medical condition, such as heart disease, cancer, or diabetes, flu can make it worse. Flu can cause fever and chills, sore throat, muscle aches, fatigue, cough, headache, and runny or stuffy nose. Some people may have vomiting and diarrhea, though this is more common in children than adults. In an average year, thousands of people in the [place] die from flu, and many more are hospitalized. Flu vaccine prevents millions of illnesses and flu-related visits to the doctor each year. 2. Influenza vaccines CDC recommends everyone 6 months and older get vaccinated every flu season. Children 6 months through 37 years of age may need 2 doses during a single flu season. Everyone else needs only 1 dose each flu season. It takes about 2 weeks for protection to develop after vaccination. There are many flu viruses, and they are always changing. Each year a  new flu vaccine is made to protect against the influenza viruses believed to be likely to cause disease in the upcoming flu season. Even when the vaccine doesn't exactly match these viruses, it may still provide some protection. Influenza vaccine does not cause flu. Influenza vaccine may be given at the same time as other vaccines. 3. Talk with your health care provider Tell your vaccination provider if the person getting the vaccine: Has had an allergic reaction after a previous dose of influenza vaccine, or has any severe, life-threatening allergies Has ever had Guillain-Barr Syndrome (also called "GBS") In some cases, your health care provider may decide to postpone influenza vaccination until a future visit. Influenza vaccine can be administered at any time during pregnancy. People who are or will be pregnant during influenza season should receive inactivated influenza vaccine. People with minor illnesses, such as a cold, may be vaccinated. People who are moderately or severely ill should usually wait until they recover before getting influenza vaccine. Your health care provider can give you more information. 4. Risks of a vaccine reaction Soreness, redness, and swelling where the shot is given, fever, muscle aches, and headache can happen after influenza vaccination. There may be a very small increased risk of Guillain-Barr Syndrome (GBS) after inactivated influenza vaccine (the flu shot). Young children who get the flu shot along with pneumococcal vaccine (PCV13) and/or DTaP vaccine at the same time might be slightly more likely to have a seizure caused by fever. Tell your health care provider if a child who  is getting flu vaccine has ever had a seizure. People sometimes faint after medical procedures, including vaccination. Tell your provider if you feel dizzy or have vision changes or ringing in the ears. As with any medicine, there is a very remote chance of a vaccine causing a severe  allergic reaction, other serious injury, or death. 5. What if there is a serious problem? An allergic reaction could occur after the vaccinated person leaves the clinic. If you see signs of a severe allergic reaction (hives, swelling of the face and throat, difficulty breathing, a fast heartbeat, dizziness, or weakness), call 9-1-1 and get the person to the nearest hospital. For other signs that concern you, call your health care provider. Adverse reactions should be reported to the Vaccine Adverse Event Reporting System (VAERS). Your health care provider will usually file this report, or you can do it yourself. Visit the VAERS website at www.vaers.SamedayNews.es or call 712 654 6207. VAERS is only for reporting reactions, and VAERS staff members do not give medical advice. 6. The National Vaccine Injury Compensation Program The [employer] Vaccine Injury Compensation Program (VICP) is a federal program that was created to compensate people who may have been injured by certain vaccines. Claims regarding alleged injury or death due to vaccination have a time limit for filing, which may be as short as two years. Visit the VICP website at GoldCloset.com.ee or call 774-514-7732 to learn about the program and about filing a claim. 7. How can I learn more? Ask your health care provider. Call your local or state health department. Visit the website of the Food and Drug Administration (FDA) for vaccine package inserts and additional information at TraderRating.uy. Contact the Centers for Disease Control and Prevention (CDC): Call 570-233-3271 (1-800-CDC-INFO) or Visit CDC's website at https://gibson.com/. Vaccine Information Statement Inactivated Influenza Vaccine (11/22/2019) This information is not intended to replace advice given to you by your health care provider. Make sure you discuss any questions you have with your health care provider. Document Revised: 01/09/2020  Document Reviewed: 01/09/2020 Elsevier Patient Education  [place]. Orders Placed This Encounter  Procedures   Flu Vaccine QUAD 22mo+IM (Fluarix, Fluzone & Alfiuria Quad PF)   Varicella-zoster vaccine IM    No orders of the defined types were placed in this encounter.    See below for relevant physical exam findings  See below for recent lab and imaging results reviewed  Medications, allergies, PMH, PSH, SocH, FamH reviewed below    Follow-up instructions: No follow-ups on file.                                        Exam:  BP (!) 91/52   Pulse 82   Temp 98 F (36.7 C)   Ht $R'5\' 2"'rs$  (1.575 m)   Wt 142 lb (64.4 kg)   LMP 05/03/2017 (Approximate)   SpO2 100%   BMI 25.97 kg/m  Constitutional: VS see above. General Appearance: alert, well-developed, well-nourished, NAD Neck: No masses, trachea midline.  Respiratory: Normal respiratory effort. no wheeze, no rhonchi, no rales Cardiovascular: S1/S2 normal, no murmur, no rub/gallop auscultated. RRR.  Musculoskeletal: Gait normal. Symmetric and independent movement of all extremities Abdominal: non-tender, non-distended, no appreciable organomegaly, neg Murphy's, BS WNLx4 Neurological: Normal balance/coordination. No tremor. Skin: warm, dry, intact.  Psychiatric: Normal judgment/insight. Normal mood and affect. Oriented x3.   Current Meds  Medication Sig   acetaminophen (TYLENOL) 500 MG  tablet Take OTC as directed   Biotin 5000 MCG TABS Take by mouth.   buPROPion (WELLBUTRIN XL) 300 MG 24 hr tablet Take 1 tablet (300 mg total) by mouth every morning.   Cyanocobalamin (B-12) 1000 MCG SUBL Place under the tongue daily.    escitalopram (LEXAPRO) 20 MG tablet Take 1 tablet (20 mg total) by mouth daily.   fluticasone (FLONASE) 50 MCG/ACT nasal spray USE 2 SPRAYS IN EACH NOSTRIL DAILY AS NEEDED   Ibuprofen 200 MG CAPS    levothyroxine (SYNTHROID) 75 MCG tablet Take 1 tablet (75 mcg total)  by mouth daily before breakfast. Needs lab work.   Multiple Vitamin (MULTIVITAMIN) tablet Take 1 tablet by mouth daily.     niacin 500 MG tablet    pyridoxine (B-6) 100 MG tablet    selenium 50 MCG TABS tablet Take 200 mcg by mouth daily.    SUMAtriptan (IMITREX) 50 MG tablet Take 1 tablet (50 mg total) by mouth daily as needed for migraine. Take one by mouth times one as needed for migraine- max one pill in a day.   thiamine 250 MG tablet    valACYclovir (VALTREX) 500 MG tablet Take 1 tablet (500 mg total) by mouth daily.    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   Weight gain 09/28/2018   Arthralgia of both hands 01/18/2018   Hypothyroidism due to Hashimoto's thyroiditis 07/05/2017   Chronic fatigue 05/17/2017   Fracture of coronoid process of ulna, left, closed 01/24/2017   Stress reaction 09/27/2013   Routine general medical examination at a health care facility 12/19/2012   Syncope/dysautomia 01/03/2011   ALLERGIC RHINITIS 12/29/2008   MIGRAINE, CLASSICAL 06/19/2008    Family History  Problem Relation Age of Onset   Hyperlipidemia Mother    Heart disease Mother        A-fib, ablation   Migraines Daughter    Diabetes Daughter        type I   Depression Daughter        bipolar    Social History   Tobacco Use  Smoking Status Never  Smokeless Tobacco Never    Past Surgical History:  Procedure Laterality Date   CERVICAL CONIZATION W/BX  1987, 1988   DILATION AND CURETTAGE OF UTERUS      Immunization History  Administered Date(s) Administered   Influenza,inj,Quad PF,6+ Mos 12/01/2017, 01/06/2021   Influenza-Unspecified 01/20/2017, 01/01/2019, 02/29/2020   PFIZER(Purple Top)SARS-COV-2 Vaccination 05/07/2019, 05/28/2019, 12/21/2019, 08/19/2020   Zoster Recombinat (Shingrix) 01/06/2021    Recent Results (from the past 2160 hour(s))  CBC     Status: None   Collection Time: 12/28/20  9:06  AM  Result Value Ref Range   WBC 3.9 3.8 - 10.8 Thousand/uL   RBC 3.99 3.80 - 5.10 Million/uL   Hemoglobin 13.1 11.7 - 15.5 g/dL   HCT 39.1 35.0 - 45.0 %   MCV 98.0 80.0 - 100.0 fL   MCH 32.8 27.0 - 33.0 pg   MCHC 33.5 32.0 - 36.0 g/dL   RDW 11.6 11.0 - 15.0 %   Platelets 219 140 - 400 Thousand/uL   MPV 11.1 7.5 - 12.5 fL  COMPLETE METABOLIC PANEL WITH GFR     Status: None   Collection Time: 12/28/20  9:06 AM  Result Value Ref Range   Glucose, Bld 75 65 - 99 mg/dL    Comment: .  Fasting reference interval .    BUN 10 7 - 25 mg/dL   Creat 0.86 0.50 - 1.03 mg/dL   eGFR 81 > OR = 60 mL/min/1.91m2    Comment: The eGFR is based on the CKD-EPI 2021 equation. To calculate  the new eGFR from a previous Creatinine or Cystatin C result, go to https://www.kidney.org/professionals/ kdoqi/gfr%5Fcalculator    BUN/Creatinine Ratio NOT APPLICABLE 6 - 22 (calc)   Sodium 142 135 - 146 mmol/L   Potassium 4.3 3.5 - 5.3 mmol/L   Chloride 106 98 - 110 mmol/L   CO2 29 20 - 32 mmol/L   Calcium 9.5 8.6 - 10.4 mg/dL   Total Protein 6.9 6.1 - 8.1 g/dL   Albumin 4.4 3.6 - 5.1 g/dL   Globulin 2.5 1.9 - 3.7 g/dL (calc)   AG Ratio 1.8 1.0 - 2.5 (calc)   Total Bilirubin 0.5 0.2 - 1.2 mg/dL   Alkaline phosphatase (APISO) 45 37 - 153 U/L   AST 15 10 - 35 U/L   ALT 12 6 - 29 U/L  Lipid panel     Status: Abnormal   Collection Time: 12/28/20  9:06 AM  Result Value Ref Range   Cholesterol 189 <200 mg/dL   HDL 46 (L) > OR = 50 mg/dL   Triglycerides 113 <150 mg/dL   LDL Cholesterol (Calc) 121 (H) mg/dL (calc)    Comment: Reference range: <100 . Desirable range <100 mg/dL for primary prevention;   <70 mg/dL for patients with CHD or diabetic patients  with > or = 2 CHD risk factors. Marland Kitchen LDL-C is now calculated using the Martin-Hopkins  calculation, which is a validated novel method providing  better accuracy than the Friedewald equation in the  estimation of LDL-C.  Cresenciano Genre et al. Annamaria Helling.  3500;938(18): 2061-2068  (http://education.QuestDiagnostics.com/faq/FAQ164)    Total CHOL/HDL Ratio 4.1 <5.0 (calc)   Non-HDL Cholesterol (Calc) 143 (H) <130 mg/dL (calc)    Comment: For patients with diabetes plus 1 major ASCVD risk  factor, treating to a non-HDL-C goal of <100 mg/dL  (LDL-C of <70 mg/dL) is considered a therapeutic  option.   TSH     Status: None   Collection Time: 12/28/20  9:06 AM  Result Value Ref Range   TSH 2.41 mIU/L    Comment:           Reference Range .           > or = 20 Years  0.40-4.50 .                Pregnancy Ranges           First trimester    0.26-2.66           Second trimester   0.55-2.73           Third trimester    0.43-2.91   Vitamin B1     Status: Abnormal   Collection Time: 12/28/20  9:06 AM  Result Value Ref Range   Vitamin B1 (Thiamine) 57 (H) 8 - 30 nmol/L    Comment: Marland Kitchen Vitamin supplementation within 24 hours prior to blood draw may affect the accuracy of the results. . This test was developed and its analytical performance characteristics have been determined by [place], [place]. It has not been cleared or approved by the U.S. Food and Drug Administration. This assay has been validated pursuant to the CLIA regulations and is used for clinical purposes. Marland Kitchen  No results found.     All questions at time of visit were answered - patient instructed to contact office with any additional concerns or updates. ER/RTC precautions were reviewed with the patient as applicable.   Please note: manual typing as well as voice recognition software may have been used to produce this document - typos may escape review. Please contact Dr. Sheppard Coil for any needed clarifications.

## 2021-01-07 ENCOUNTER — Other Ambulatory Visit: Payer: Self-pay | Admitting: Osteopathic Medicine

## 2021-01-07 MED ORDER — SUMATRIPTAN SUCCINATE 50 MG PO TABS: 50.0000 mg | ORAL_TABLET | Freq: Every day | ORAL | 2 refills | Status: DC | PRN

## 2021-01-08 ENCOUNTER — Other Ambulatory Visit: Payer: Self-pay

## 2021-01-08 DIAGNOSIS — R768 Other specified abnormal immunological findings in serum: Secondary | ICD-10-CM

## 2021-01-08 MED ORDER — VALACYCLOVIR HCL 500 MG PO TABS: 500.0000 mg | ORAL_TABLET | Freq: Every day | ORAL | 3 refills | Status: DC

## 2021-01-13 ENCOUNTER — Encounter: Payer: Self-pay | Admitting: Medical-Surgical

## 2021-01-13 DIAGNOSIS — R768 Other specified abnormal immunological findings in serum: Secondary | ICD-10-CM

## 2021-01-13 MED ORDER — LEVOTHYROXINE SODIUM 75 MCG PO TABS
75.0000 ug | ORAL_TABLET | Freq: Every day | ORAL | 1 refills | Status: DC
Start: 2021-01-13 — End: 2021-08-12

## 2021-01-13 MED ORDER — VALACYCLOVIR HCL 500 MG PO TABS: 500.0000 mg | ORAL_TABLET | Freq: Every day | ORAL | 3 refills | Status: DC

## 2021-01-14 ENCOUNTER — Other Ambulatory Visit: Payer: Self-pay

## 2021-01-14 MED ORDER — SUMATRIPTAN SUCCINATE 50 MG PO TABS: 50.0000 mg | ORAL_TABLET | Freq: Every day | ORAL | 2 refills | Status: DC | PRN

## 2021-01-14 MED ORDER — BUPROPION HCL ER (XL) 300 MG PO TB24: 300.0000 mg | ORAL_TABLET | ORAL | 0 refills | Status: DC

## 2021-02-15 ENCOUNTER — Encounter: Payer: Self-pay | Admitting: Medical-Surgical

## 2021-02-15 ENCOUNTER — Telehealth (INDEPENDENT_AMBULATORY_CARE_PROVIDER_SITE_OTHER): Payer: Managed Care, Other (non HMO) | Admitting: Medical-Surgical

## 2021-02-15 DIAGNOSIS — G43109 Migraine with aura, not intractable, without status migrainosus: Secondary | ICD-10-CM

## 2021-02-15 DIAGNOSIS — F5101 Primary insomnia: Secondary | ICD-10-CM

## 2021-02-15 MED ORDER — AMITRIPTYLINE HCL 25 MG PO TABS: 25.0000 mg | ORAL_TABLET | Freq: Every day | ORAL | 0 refills | Status: DC

## 2021-02-15 NOTE — Progress Notes (Signed)
Virtual Visit via Video Note  I connected with Kara Cooper on 02/15/21 at  1:20 PM EDT by a video enabled telemedicine application and verified that I am speaking with the correct person using two identifiers.   I discussed the limitations of evaluation and management by telemedicine and the availability of in person appointments. The patient expressed understanding and agreed to proceed.  Patient location: home Provider locations: office  Subjective:    CC: Headaches  HPI: Very pleasant 55 year old female presenting via [place] video visit to discuss headaches.  Notes that she quit drinking and has been sober for approximately 3 months.  For about 4 to 5 weeks after she quit drinking, she explained her headaches away as part of the alcohol cessation process.  Unfortunately she has continued having headaches at least 1 every 2 to 3 days that occur in the front of her head and move around to the back.  These do not feel like her typical migraines but are accompanied by photophobia.  Taking Imitrex as prescribed although she rarely takes this to treat her headaches because she does not want to take it so frequently.  When she did take a dose, she noted that it did improve her headache.  Has also been having some trouble sleeping, particularly staying asleep.  Past medical history, Surgical history, Family history not pertinant except as noted below, Social history, Allergies, and medications have been entered into the medical record, reviewed, and corrections made.   Review of Systems: See HPI for pertinent positives and negatives.   Objective:    General: Speaking clearly in complete sentences without any shortness of breath.  Alert and oriented x3.  Normal judgment. No apparent acute distress.  Impression and Recommendations:    1. Migraine with aura and without status migrainosus, not intractable 2. Primary insomnia Although headaches are not presenting as her typical migraines, they  do appear to be responsive to Imitrex.  Discussed preventive treatments for migraines.  Start amitriptyline 25 mg nightly to see if this is helpful.  This may also be helpful for insomnia.  If this is not helpful, we may need to consider imaging but patient would like to hold off on this for now.  We will follow-up at her establish care appointment in a few weeks.  I discussed the assessment and treatment plan with the patient. The patient was provided an opportunity to ask questions and all were answered. The patient agreed with the plan and demonstrated an understanding of the instructions.   The patient was advised to call back or seek an in-person evaluation if the symptoms worsen or if the condition fails to improve as anticipated.  20 minutes of non-face-to-face time was provided during this encounter.  Return for establish care appointment as scheduled in November.  Clearnce Sorrel, DNP, APRN, FNP-BC [facility]

## 2021-02-15 NOTE — Progress Notes (Signed)
Recently sober, over 90 days Headaches, almost every day  Having trouble staying sleep, anxiety related Wants to talk about medication options, go over med list

## 2021-03-08 ENCOUNTER — Encounter: Payer: Self-pay | Admitting: Medical-Surgical

## 2021-03-08 ENCOUNTER — Other Ambulatory Visit: Payer: Self-pay

## 2021-03-08 ENCOUNTER — Ambulatory Visit (INDEPENDENT_AMBULATORY_CARE_PROVIDER_SITE_OTHER): Payer: Managed Care, Other (non HMO) | Admitting: Medical-Surgical

## 2021-03-08 VITALS — BP 110/70 | HR 74 | Resp 20 | Ht 62.0 in | Wt 141.5 lb

## 2021-03-08 DIAGNOSIS — G43109 Migraine with aura, not intractable, without status migrainosus: Secondary | ICD-10-CM

## 2021-03-08 DIAGNOSIS — R5382 Chronic fatigue, unspecified: Secondary | ICD-10-CM

## 2021-03-08 DIAGNOSIS — F411 Generalized anxiety disorder: Secondary | ICD-10-CM

## 2021-03-08 DIAGNOSIS — F5101 Primary insomnia: Secondary | ICD-10-CM

## 2021-03-08 DIAGNOSIS — Z23 Encounter for immunization: Secondary | ICD-10-CM | POA: Diagnosis not present

## 2021-03-08 NOTE — Progress Notes (Signed)
  HPI with pertinent ROS:   CC: Transfer of care, headaches, fatigue/insomnia  HPI: Pleasant 55 year old female presenting today to transfer care to a new PCP and to discuss headaches, fatigue, and insomnia.  Approximately 3 weeks ago, she was prescribed amitriptyline to help with migraine prophylaxis, insomnia, and mood management. Has been taking the Amitriptyline 25mg  nightly as prescribed, tolerating well. Having to take it earlier in the evening since it was leaving her feeling groggy in the mornings but has noticed that she is falling asleep earlier on the couch now. When she gets up to go to bed, she has a hard time getting back to sleep. Has seen an improvement in her headaches and has only had two since starting the medication. Those she relates to sinus issues rather than migraines. Feels that her mood is stable overall and is now only on Lexapro 5mg  daily along with Wellbutrin 300mg  daily. 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 Denies SI/HI.  I reviewed the past medical history, family history, social history, surgical history, and allergies today and no changes were needed.  Please see the problem list section below in epic for further details.   Physical exam:   General: Well Developed, well nourished, and in no acute distress.  Neuro: Alert and oriented x3.  HEENT: Normocephalic, atraumatic.  Skin: Warm and dry. Cardiac: Regular rate and rhythm, no murmurs rubs or gallops, no lower extremity edema.  Respiratory: Clear to auscultation bilaterally. Not using accessory muscles, speaking in full sentences.  Impression and Recommendations:    1. Migraine with aura and without status migrainosus, not intractable 2. Chronic fatigue 3. Primary insomnia 4. Generalized anxiety disorder Doing well on her current regimen.  Continue Lexapro 5 mg daily for now.  Discussed the possibility of weaning off of this should she desire.  Advised weaning  slowly over a period of 4 to 6 weeks to prevent rebound symptoms.  Continue Wellbutrin 300 mg daily.  Continue amitriptyline 25 mg nightly.  Advise adjusting dosing time to find a good median between falling asleep in bed and waking without grogginess.  If necessary, advised her to contact me and I will send in amitriptyline 10 mg tablets so that she can play around with dosing for either 15 or 20 mg nightly to see if this is more beneficial while still managing her headaches.  5. Need for shingles vaccine Shingrix No. 2 given in office today.  Return in about 3 months (around 06/08/2021) for mood follow up. ___________________________________________ Clearnce Sorrel, DNP, APRN, FNP-BC [facility] [place]

## 2021-03-20 ENCOUNTER — Other Ambulatory Visit: Payer: Self-pay | Admitting: Osteopathic Medicine

## 2021-06-04 ENCOUNTER — Encounter: Payer: Self-pay | Admitting: Medical-Surgical

## 2021-06-04 ENCOUNTER — Telehealth (INDEPENDENT_AMBULATORY_CARE_PROVIDER_SITE_OTHER): Payer: Managed Care, Other (non HMO) | Admitting: Medical-Surgical

## 2021-06-04 ENCOUNTER — Other Ambulatory Visit: Payer: Self-pay

## 2021-06-04 DIAGNOSIS — R112 Nausea with vomiting, unspecified: Secondary | ICD-10-CM | POA: Diagnosis not present

## 2021-06-04 DIAGNOSIS — K219 Gastro-esophageal reflux disease without esophagitis: Secondary | ICD-10-CM

## 2021-06-04 MED ORDER — ONDANSETRON 8 MG PO TBDP: 8.0000 mg | ORAL_TABLET | Freq: Three times a day (TID) | ORAL | 3 refills | Status: DC | PRN

## 2021-06-04 MED ORDER — FAMOTIDINE 20 MG PO TABS: 20.0000 mg | ORAL_TABLET | Freq: Two times a day (BID) | ORAL | 1 refills | Status: DC

## 2021-06-04 MED ORDER — PANTOPRAZOLE SODIUM 40 MG PO TBEC: 40.0000 mg | DELAYED_RELEASE_TABLET | Freq: Every day | ORAL | 3 refills | Status: DC

## 2021-06-04 NOTE — Progress Notes (Signed)
Virtual Visit via Video Note  I connected with Kara Cooper on 06/04/21 at  4:00 PM EST by a video enabled telemedicine application and verified that I am speaking with the correct person using two identifiers.   I discussed the limitations of evaluation and management by telemedicine and the availability of in person appointments. The patient expressed understanding and agreed to proceed.  Patient location: home Provider locations: office  Subjective:    CC: nausea, vomiting  HPI: Pleasant 56 year old female presenting via MyChart video visit for reports of worsening nausea and vomiting  over the past few weeks. Originally had some issues with intermittent nausea and occasional vomiting after quitting alcohol but that gradually resolved. She has been 6 months sober now and has been doing great. Over the past few weeks, her symptoms returned and have progressed until she is vomiting no matter what she is eating. She has kept a diary and has been unable to find any correlation with time of day, volume of food, or type of food. At times, she does feel like food gets stuck going down but this is most often when she eats bread. Notes an increase in gassiness until it feels like she is Human resources officer. She has tried Mylanta but this hasn't been very helpful. Thinks she may have seen a little blood in her emesis a couple of days ago but isn't sure since it could have been something she ate. Denies fever, chills, chest pain, shortness of breath, abdominal pain outside of gassiness, constipation, diarrhea, melena, and hematochezia.   Past medical history, Surgical history, Family history not pertinant except as noted below, Social history, Allergies, and medications have been entered into the medical record, reviewed, and corrections made.   Review of Systems: See HPI for pertinent positives and negatives.   Objective:    General: Speaking clearly in complete sentences without any shortness of breath.   Alert and oriented x3.  Normal judgment. No apparent acute distress.  Impression and Recommendations:    1. Gastroesophageal reflux disease, unspecified whether esophagitis present 2. Intractable nausea and vomiting Known history of reflux currently untreated. Start Protonix 40mg  daily. Adding Famotidine 20mg  BID prn in case Protonix alone does not relieve symptoms completely. Zofran ODT 8mg  every 8 hours prn. Some concern for esophagitis, PUD, esophageal stricture. Feel an upper endoscopy may ultimately be needed. Referring to GI for further evaluation and treatment.   I discussed the assessment and treatment plan with the patient. The patient was provided an opportunity to ask questions and all were answered. The patient agreed with the plan and demonstrated an understanding of the instructions.   The patient was advised to call back or seek an in-person evaluation if the symptoms worsen or if the condition fails to improve as anticipated.  25 minutes of non-face-to-face time was provided during this encounter.  Return in about 4 weeks (around 07/02/2021) for nausea/vomiting follow up.  Clearnce Sorrel, DNP, APRN, FNP-BC [facility]

## 2021-07-01 ENCOUNTER — Other Ambulatory Visit: Payer: Self-pay | Admitting: Medical-Surgical

## 2021-07-01 ENCOUNTER — Other Ambulatory Visit: Payer: Self-pay | Admitting: Osteopathic Medicine

## 2021-07-13 ENCOUNTER — Other Ambulatory Visit: Payer: Self-pay | Admitting: Medical-Surgical

## 2021-08-02 ENCOUNTER — Other Ambulatory Visit: Payer: Self-pay | Admitting: Medical-Surgical

## 2021-08-02 ENCOUNTER — Encounter: Payer: Self-pay | Admitting: Medical-Surgical

## 2021-08-12 ENCOUNTER — Other Ambulatory Visit: Payer: Self-pay | Admitting: Medical-Surgical

## 2021-08-13 ENCOUNTER — Telehealth: Payer: BC Managed Care – PPO | Admitting: Medical-Surgical

## 2021-08-13 DIAGNOSIS — E063 Autoimmune thyroiditis: Secondary | ICD-10-CM

## 2021-08-13 DIAGNOSIS — E038 Other specified hypothyroidism: Secondary | ICD-10-CM

## 2021-08-13 DIAGNOSIS — R635 Abnormal weight gain: Secondary | ICD-10-CM

## 2021-08-13 NOTE — Patient Instructions (Signed)
Protein Content in Foods Protein is a necessary nutrient in any diet. It helps build and repair muscles, bones, and skin. Depending on your overall health, you may need more or less protein in your diet. You are encouraged to eat a variety of protein foods to ensure that you get all the essential nutrients that are found in different protein foods. Talk with your health care provider or dietitian about how much protein you need each day and which sources of protein are best for you. Protein is especially important for: Repairing and making cells and tissues. Fighting infection. Providing energy. Growth and development. See the following list for the protein content of some common foods. What are tips for getting more protein in your diet? Try to replace processed carbohydrates with high-quality protein. Snack on nuts and seeds instead of chips. Replace baked desserts with Greek yogurt. Eat protein foods from both plant and animal sources. Replace red meat with seafood. Add beans and peas to salads, soups, and side dishes. Include a protein food with each meal and snack. Reading food labels You can find the amount of protein in a food item by looking at the nutrition facts label. Use the total grams listed to help you reach your daily goal. What foods are high in protein?  High-protein foods contain 4 grams (g) or more of protein per serving. They include: Grains Quinoa (cooked) -- 1 cup (185 g) has 8 g of protein. Whole wheat pasta (cooked) -- 1 cup (140 g) has 6 g of protein. Meat Beef, ground sirloin (cooked) -- 3 oz (85 g) has 24 g of protein. Chicken breast, boneless and skinless (cooked) -- 3 oz (85 g) has 25 g of protein. Egg -- 1 egg has 6 g of protein. Fish, filet (cooked) -- 1 oz (28 g) has 6-7 g of protein. Lamb (cooked) -- 3 oz (85 g) has 24 g of protein. Pork tenderloin (cooked) -- 3 oz (85 g) has 23 g of protein. Tuna (canned in water) -- 3 oz (85 g) has 20 g of  protein. Dairy Cottage cheese --  cup (114 g) has 13.4 g of protein. Milk -- 1 cup (237 mL) has 8 g of protein. Cheese (hard) -- 1 oz (28 g) has 7 g of protein. Yogurt, regular -- 6 oz (170 g) has 8 g of protein. Greek yogurt -- 6 oz (200 g) has 18 g protein. Plant protein Garbanzo beans (canned or cooked) --  cup (130 g) has 6-7 g of protein. Kidney beans (canned or cooked) --  cup (130 g) has 6-7 g of protein. Nuts (peanuts, pistachios, almonds) -- 1 oz (28 g) has 6 g of protein. Peanut butter -- 1 oz (32 g) has 7-8 g of protein. Pumpkin seeds -- 1 oz (28 g) has 8.5 g of protein. Soybeans (roasted) -- 1 oz (28 g) has 8 g of protein. Soybeans (cooked) --  cup (90 g) has 11 g of protein. Soy milk -- 1 cup (250 mL) has 5-10 g of protein. Soy or vegetable patty -- 1 patty has 11 g of protein. Sunflower seeds -- 1 oz (28 g) has 5.5 g of protein. Buckwheat -- 1 oz (33 g) has 4.3 g of protein. Tofu (firm) --  cup (124 g) has 20 g of protein. Tempeh --  cup (83 g) has 16 g of protein. The items listed above may not be a complete list of foods high in protein. Actual amounts of protein may differ   depending on processing. Contact a dietitian for more information. What foods are low in protein?  Low-protein foods contain 3 grams (g) or less of protein per serving. They include: Fruits Fruit or vegetable juice --  cup (125 mL) has 1 g of protein. Vegetables Beets (raw or cooked) --  cup (68 g) has 1.5 g of protein. Broccoli (raw or cooked) --  cup (44 g) has 2 g of protein. Collard greens (raw or cooked) --  cup (42 g) has 2 g of protein. Green beans (raw or cooked) --  cup (83 g) has 1 g of protein. Green peas (canned) --  cup (80 g) has 3.5 g of protein. Potato (baked with skin) -- 1 medium potato (173 g) has 3 g of protein. Spinach (cooked) --  cup (90 g) has 3 g of protein. Squash (cooked) --  cup (90 g) has 1.5 g of protein. Avocado -- 1 cup (146 g) has 2.7 g of  protein. Grains Bran cereal --  cup (30 g) has 2-3 g of protein. Bread -- 1 slice has 2.5 g of protein. Corn (fresh or cooked) --  cup (77 g) has 2 g of protein. Flour tortilla -- One 6-inch (15 cm) tortilla has 2.5 g of protein. Muffins -- 1 small muffin (2 oz or 57 g) has 3 g of protein. Oatmeal (cooked) --  cup (40 g) has 3 g of protein. Rice (cooked) --  cup (79 g) has 2.5-3.5 g of protein. Dairy Cream cheese -- 1 oz (29 g) has 2 g of protein. Creamer (half-and-half) -- 1 oz (29 mL) has 1 g of protein. Frozen yogurt --  cup (72 g) has 3 g of protein. Sour cream --  cup (75 g) has 2.5 g of protein. The items listed above may not be a complete list of foods low in protein. Actual amounts of protein may differ depending on processing. Contact a dietitian for more information. Summary Protein is a nutrient that your body needs for growth and development, repairing and making cells and tissues, fighting infection, and providing energy. Protein is in both plant and animal foods. Some of these foods have more protein than others. Depending on your overall health, you may need more or less protein in your diet. Talk to your health care provider about how much protein you need. This information is not intended to replace advice given to you by your health care provider. Make sure you discuss any questions you have with your health care provider. Document Revised: 03/09/2020 Document Reviewed: 03/09/2020 Elsevier Patient Education  2023 Elsevier Inc.  

## 2021-08-13 NOTE — Progress Notes (Signed)
Virtual Visit via Video Note ? ?I connected with Kara Cooper on 08/13/21 at  1:00 PM EDT by a video enabled telemedicine application and verified that I am speaking with the correct person using two identifiers. ?  ?I discussed the limitations of evaluation and management by telemedicine and the availability of in person appointments. The patient expressed understanding and agreed to proceed. ? ?Patient location: home ?Provider locations: office ? ?Subjective:   ? ?CC: weight gain r/t menopause, depression ? ?HPI: ?Pleasant 56 year old female presenting today to discuss depression and weight gain related to menopause.  Notes that she decided to come off of her thyroid medications back in December and has not had any of the levothyroxine since January.  She also decided to wean off the Lexapro after reading several articles regarding the use of that medication.  Since then, she has noted a 10 pound weight gain despite no change in dietary habits.  She works out doing yoga daily as well as cardio walking 3-4 times weekly.  Notes that she is pescatarian which makes it very hard to get enough protein in.  Usually has something like oatmeal for breakfast and peanut butter and jelly for lunch.  She sometimes eats chicken but not very often. She did quit drinking alcohol.  Since making all of these changes, she has noted that she does not have as much depression as she did before however she does have a little anxiety at times.  After noting all of her recent symptoms, she restarted her levothyroxine last week.   ? ?Past medical history, Surgical history, Family history not pertinant except as noted below, Social history, Allergies, and medications have been entered into the medical record, reviewed, and corrections made.  ? ?Review of Systems: See HPI for pertinent positives and negatives.  ? ?Objective:   ? ?General: Speaking clearly in complete sentences without any shortness of breath.  Alert and oriented x3.  Normal  judgment. No apparent acute distress. ? ?Impression and Recommendations:   ? ?1. Weight gain ?There is likely a perfect storm occurring here as she has come off of her mood altering medication, quit drinking alcohol, and stopped her thyroid medication.  She is also 3 years into menopause but has not made any changes from her prior activities and dietary intake.  Recommend evaluating her diet for protein intake.  She can do this by getting a free app to log foods and weighing her portions.  Also recommend adding strength training to help build muscle and change her body composition.  We will hold off on adding back Lexapro at this point and see how she does with these measures.  Protein rich foods information sent with AVS and discussed during the visit. ? ?2. Hypothyroidism due to Hashimoto's thyroiditis ?Since she has restarted levothyroxine, it would not be beneficial to check her levels right now.  We will plan to check these in 5 to 6 weeks.  Orders entered and she can to swing by the lab for blood draw. ? ?I discussed the assessment and treatment plan with the patient. The patient was provided an opportunity to ask questions and all were answered. The patient agreed with the plan and demonstrated an understanding of the instructions. ?  ?The patient was advised to call back or seek an in-person evaluation if the symptoms worsen or if the condition fails to improve as anticipated. ? ?25 minutes of non-face-to-face time was provided during this encounter. ? ?Return for Labs in 5 to 6  weeks as discussed, orders entered. ? ?Clearnce Sorrel, DNP, APRN, FNP-BC ?[place] [facility] ?

## 2021-08-16 ENCOUNTER — Telehealth: Payer: Managed Care, Other (non HMO) | Admitting: Medical-Surgical

## 2021-08-21 ENCOUNTER — Other Ambulatory Visit: Payer: Self-pay | Admitting: Medical-Surgical

## 2021-09-10 ENCOUNTER — Encounter: Payer: Self-pay | Admitting: Neurology

## 2021-09-14 ENCOUNTER — Encounter: Payer: Self-pay | Admitting: Medical-Surgical

## 2021-09-14 NOTE — Telephone Encounter (Signed)
Updated in Health Maintenance. 

## 2021-09-23 ENCOUNTER — Other Ambulatory Visit: Payer: Self-pay | Admitting: Medical-Surgical

## 2021-09-28 DIAGNOSIS — E038 Other specified hypothyroidism: Secondary | ICD-10-CM | POA: Diagnosis not present

## 2021-09-28 DIAGNOSIS — E063 Autoimmune thyroiditis: Secondary | ICD-10-CM | POA: Diagnosis not present

## 2021-09-29 ENCOUNTER — Other Ambulatory Visit: Payer: Self-pay

## 2021-09-29 DIAGNOSIS — E038 Other specified hypothyroidism: Secondary | ICD-10-CM

## 2021-09-29 LAB — TSH+FREE T4: TSH W/REFLEX TO FT4: 3.04 mIU/L

## 2021-09-29 NOTE — Progress Notes (Signed)
Lab orders

## 2021-10-01 ENCOUNTER — Other Ambulatory Visit: Payer: Self-pay | Admitting: Medical-Surgical

## 2021-10-01 ENCOUNTER — Encounter: Payer: Self-pay | Admitting: Medical-Surgical

## 2021-10-04 ENCOUNTER — Other Ambulatory Visit: Payer: Self-pay | Admitting: *Deleted

## 2021-10-04 MED ORDER — AMITRIPTYLINE HCL 25 MG PO TABS: ORAL_TABLET | ORAL | 0 refills | Status: DC

## 2021-11-22 NOTE — Progress Notes (Unsigned)
   Established Patient Office Visit  Subjective   Patient ID: Kara Cooper, female   DOB: 1966/02/09 Age: 56 y.o. MRN: [id number]   No chief complaint on file.   HPI Pleasant 56 year old female presenting today to discuss abnormal weight gain.  Reports that she has gained approximately 15 pounds  ROS    Objective:    There were no vitals filed for this visit.  Physical Exam   No results found for this or any previous visit (from the past 24 hour(s)).   {Labs (Optional):23779}  The 10-year ASCVD risk score (Arnett DK, et al., 2019) is: 1.5%   Values used to calculate the score:     Age: 39 years     Sex: Female     Is Non-Hispanic African American: No     Diabetic: No     Tobacco smoker: No     Systolic Blood Pressure: 638 mmHg     Is BP treated: No     HDL Cholesterol: 46 mg/dL     Total Cholesterol: 189 mg/dL   Assessment & Plan:   No problem-specific Assessment & Plan notes found for this encounter.   No follow-ups on file.  ___________________________________________ Clearnce Sorrel, DNP, APRN, FNP-BC [facility] [place]

## 2021-11-23 ENCOUNTER — Other Ambulatory Visit: Payer: Self-pay | Admitting: Medical-Surgical

## 2021-11-23 ENCOUNTER — Encounter: Payer: Self-pay | Admitting: Medical-Surgical

## 2021-11-23 ENCOUNTER — Ambulatory Visit: Payer: BC Managed Care – PPO | Admitting: Medical-Surgical

## 2021-11-23 VITALS — BP 109/69 | HR 98 | Resp 20 | Ht 62.0 in | Wt 154.8 lb

## 2021-11-23 DIAGNOSIS — M7989 Other specified soft tissue disorders: Secondary | ICD-10-CM

## 2021-11-23 DIAGNOSIS — R635 Abnormal weight gain: Secondary | ICD-10-CM

## 2021-11-23 DIAGNOSIS — L659 Nonscarring hair loss, unspecified: Secondary | ICD-10-CM | POA: Diagnosis not present

## 2021-11-23 DIAGNOSIS — R232 Flushing: Secondary | ICD-10-CM

## 2021-11-23 DIAGNOSIS — R4189 Other symptoms and signs involving cognitive functions and awareness: Secondary | ICD-10-CM | POA: Diagnosis not present

## 2021-11-23 DIAGNOSIS — R5383 Other fatigue: Secondary | ICD-10-CM

## 2021-11-24 ENCOUNTER — Encounter: Payer: Self-pay | Admitting: Medical-Surgical

## 2021-11-24 LAB — COMPLETE METABOLIC PANEL WITH GFR
AG Ratio: 1.6 (calc) (ref 1.0–2.5)
ALT: 15 U/L (ref 6–29)
AST: 16 U/L (ref 10–35)
Albumin: 4.6 g/dL (ref 3.6–5.1)
Alkaline phosphatase (APISO): 62 U/L (ref 37–153)
BUN: 13 mg/dL (ref 7–25)
CO2: 27 mmol/L (ref 20–32)
Calcium: 9.1 mg/dL (ref 8.6–10.4)
Chloride: 103 mmol/L (ref 98–110)
Creat: 0.96 mg/dL (ref 0.50–1.03)
Globulin: 2.8 g/dL (calc) (ref 1.9–3.7)
Glucose, Bld: 87 mg/dL (ref 65–99)
Potassium: 3.9 mmol/L (ref 3.5–5.3)
Sodium: 139 mmol/L (ref 135–146)
Total Bilirubin: 0.4 mg/dL (ref 0.2–1.2)
Total Protein: 7.4 g/dL (ref 6.1–8.1)
eGFR: 70 mL/min/{1.73_m2} (ref >=60)

## 2021-11-24 LAB — CBC WITH DIFFERENTIAL/PLATELET
Absolute Monocytes: 386 cells/uL (ref 200–950)
Basophils Absolute: 50 cells/uL (ref 0–200)
Basophils Relative: 1.2 %
Eosinophils Absolute: 118 cells/uL (ref 15–500)
Eosinophils Relative: 2.8 %
HCT: 39.8 % (ref 35.0–45.0)
Hemoglobin: 13.6 g/dL (ref 11.7–15.5)
Lymphs Abs: 1323 cells/uL (ref 850–3900)
MCH: 33.3 pg — ABNORMAL HIGH (ref 27.0–33.0)
MCHC: 34.2 g/dL (ref 32.0–36.0)
MCV: 97.5 fL (ref 80.0–100.0)
MPV: 10.8 fL (ref 7.5–12.5)
Monocytes Relative: 9.2 %
Neutro Abs: 2323 cells/uL (ref 1500–7800)
Neutrophils Relative %: 55.3 %
Platelets: 250 10*3/uL (ref 140–400)
RBC: 4.08 10*6/uL (ref 3.80–5.10)
RDW: 11.7 % (ref 11.0–15.0)
Total Lymphocyte: 31.5 %
WBC: 4.2 10*3/uL (ref 3.8–10.8)

## 2021-11-24 LAB — HEMOGLOBIN A1C
Hgb A1c MFr Bld: 4.9 % of total Hgb (ref <5.7)
Mean Plasma Glucose: 94 mg/dL
eAG (mmol/L): 5.2 mmol/L

## 2021-11-24 LAB — TSH: TSH: 4.43 mIU/L

## 2021-11-24 LAB — CORTISOL: Cortisol, Plasma: 9.9 ug/dL

## 2021-11-24 LAB — T4, FREE: Free T4: 1.3 ng/dL (ref 0.8–1.8)

## 2021-11-25 ENCOUNTER — Other Ambulatory Visit: Payer: Self-pay | Admitting: Neurology

## 2021-11-25 ENCOUNTER — Other Ambulatory Visit: Payer: Self-pay | Admitting: Medical-Surgical

## 2021-11-25 DIAGNOSIS — R635 Abnormal weight gain: Secondary | ICD-10-CM | POA: Diagnosis not present

## 2021-11-25 DIAGNOSIS — E038 Other specified hypothyroidism: Secondary | ICD-10-CM

## 2021-11-25 MED ORDER — LEVOTHYROXINE SODIUM 88 MCG PO TABS: 88.0000 ug | ORAL_TABLET | Freq: Every day | ORAL | 3 refills | Status: DC

## 2021-12-07 LAB — 5 HIAA, QUANTITATIVE, URINE, 24 HOUR
5 HIAA, 24 Hour Urine: 2.6 mg/24 h (ref <=6.0)
Total Volume: 1525 mL

## 2022-01-28 DIAGNOSIS — Z23 Encounter for immunization: Secondary | ICD-10-CM | POA: Diagnosis not present

## 2022-02-11 ENCOUNTER — Encounter: Payer: Self-pay | Admitting: Medical-Surgical

## 2022-02-11 ENCOUNTER — Telehealth (INDEPENDENT_AMBULATORY_CARE_PROVIDER_SITE_OTHER): Payer: BC Managed Care – PPO | Admitting: Medical-Surgical

## 2022-02-11 DIAGNOSIS — F419 Anxiety disorder, unspecified: Secondary | ICD-10-CM | POA: Diagnosis not present

## 2022-02-11 MED ORDER — HYDROXYZINE HCL 50 MG PO TABS: 25.0000 mg | ORAL_TABLET | Freq: Every evening | ORAL | 1 refills | Status: DC | PRN

## 2022-02-11 MED ORDER — FLUOXETINE HCL 10 MG PO CAPS: 10.0000 mg | ORAL_CAPSULE | Freq: Every day | ORAL | 0 refills | Status: DC

## 2022-02-11 MED ORDER — FLUOXETINE HCL 20 MG PO CAPS: 20.0000 mg | ORAL_CAPSULE | Freq: Every day | ORAL | 3 refills | Status: DC

## 2022-02-11 NOTE — Progress Notes (Signed)
Virtual Visit via Video Note  I connected with Kara Cooper on 02/11/22 at 10:30 AM EDT by a video enabled telemedicine application and verified that I am speaking with the correct person using two identifiers.   I discussed the limitations of evaluation and management by telemedicine and the availability of in person appointments. The patient expressed understanding and agreed to proceed.  Patient location: home Provider locations: office  Subjective:    CC: Anxiety  HPI: Pleasant 56 year old female presenting today via [facility] video visit for discussion on anxiety.  She has been taking amitriptyline 25 mg nightly, tolerating well without side effects.  Reports that the medication originally helped a bit with getting to sleep at night however it is no longer helpful.  She has difficulty with significant anxiety throughout the day regarding various things.  She also has difficulty quieting her mind so that she is able to fall asleep at night.  Previously took Lexapro but weaned herself off of this.  Currently taking Wellbutrin 300 mg daily, tolerating well without side effects.  Is interested in other options to help treat anxiety and depressive symptoms.  Of note, she is most worried about her weight since that she feels that she is doing everything right with diet and exercise but she is unable to get the weight off.  Past medical history, Surgical history, Family history not pertinant except as noted below, Social history, Allergies, and medications have been entered into the medical record, reviewed, and corrections made.   Review of Systems: See HPI for pertinent positives and negatives.   Objective:    General: Speaking clearly in complete sentences without any shortness of breath.  Alert and oriented x3.  Normal judgment. No apparent acute distress.  Impression and Recommendations:    1. Anxiety Referring to behavioral health for counseling.  Discussed various options for  management of her current symptoms.  Continue Wellbutrin 300 mg daily.  Continue amitriptyline 25 mg nightly.  Feel that she would benefit greatly from a controller medicine so adding fluoxetine 10 mg daily for 1 week then increase to 20 mg daily.  Also adding as needed hydroxyzine 3 times daily to help with anxiety management. - [facility]  I discussed the assessment and treatment plan with the patient. The patient was provided an opportunity to ask questions and all were answered. The patient agreed with the plan and demonstrated an understanding of the instructions.   The patient was advised to call back or seek an in-person evaluation if the symptoms worsen or if the condition fails to improve as anticipated.  25 minutes of non-face-to-face time was provided during this encounter.  Return in about 6 weeks (around 03/25/2022) for mood follow up.  Clearnce Sorrel, DNP, APRN, FNP-BC [place] [facility]

## 2022-02-17 ENCOUNTER — Encounter: Payer: Self-pay | Admitting: Medical-Surgical

## 2022-02-18 ENCOUNTER — Other Ambulatory Visit: Payer: Self-pay | Admitting: Medical-Surgical

## 2022-02-28 ENCOUNTER — Other Ambulatory Visit: Payer: Self-pay | Admitting: Medical-Surgical

## 2022-02-28 NOTE — Telephone Encounter (Signed)
Lvm for patient to call an schedule an appointment for mood follow up- tvt

## 2022-03-14 ENCOUNTER — Emergency Department (HOSPITAL_BASED_OUTPATIENT_CLINIC_OR_DEPARTMENT_OTHER): Payer: BC Managed Care – PPO | Admitting: Radiology

## 2022-03-14 ENCOUNTER — Observation Stay (HOSPITAL_BASED_OUTPATIENT_CLINIC_OR_DEPARTMENT_OTHER)
Admission: EM | Admit: 2022-03-14 | Discharge: 2022-03-16 | Disposition: A | Discharge status: Home / Self Care | Payer: BC Managed Care – PPO | Attending: Cardiovascular Disease | Admitting: Cardiovascular Disease

## 2022-03-14 ENCOUNTER — Encounter (HOSPITAL_BASED_OUTPATIENT_CLINIC_OR_DEPARTMENT_OTHER): Payer: Self-pay | Admitting: Emergency Medicine

## 2022-03-14 ENCOUNTER — Other Ambulatory Visit: Payer: Self-pay

## 2022-03-14 ENCOUNTER — Emergency Department (HOSPITAL_BASED_OUTPATIENT_CLINIC_OR_DEPARTMENT_OTHER): Payer: BC Managed Care – PPO

## 2022-03-14 ENCOUNTER — Encounter (HOSPITAL_COMMUNITY): Payer: Self-pay

## 2022-03-14 DIAGNOSIS — E876 Hypokalemia: Secondary | ICD-10-CM | POA: Diagnosis present

## 2022-03-14 DIAGNOSIS — N390 Urinary tract infection, site not specified: Secondary | ICD-10-CM | POA: Diagnosis present

## 2022-03-14 DIAGNOSIS — R002 Palpitations: Secondary | ICD-10-CM | POA: Diagnosis not present

## 2022-03-14 DIAGNOSIS — R7989 Other specified abnormal findings of blood chemistry: Secondary | ICD-10-CM | POA: Diagnosis not present

## 2022-03-14 DIAGNOSIS — G4700 Insomnia, unspecified: Secondary | ICD-10-CM | POA: Diagnosis present

## 2022-03-14 DIAGNOSIS — I471 Supraventricular tachycardia, unspecified: Secondary | ICD-10-CM | POA: Diagnosis not present

## 2022-03-14 DIAGNOSIS — F419 Anxiety disorder, unspecified: Secondary | ICD-10-CM | POA: Diagnosis present

## 2022-03-14 DIAGNOSIS — Z79899 Other long term (current) drug therapy: Secondary | ICD-10-CM | POA: Diagnosis not present

## 2022-03-14 DIAGNOSIS — K219 Gastro-esophageal reflux disease without esophagitis: Secondary | ICD-10-CM | POA: Diagnosis not present

## 2022-03-14 DIAGNOSIS — E039 Hypothyroidism, unspecified: Secondary | ICD-10-CM | POA: Insufficient documentation

## 2022-03-14 DIAGNOSIS — R0602 Shortness of breath: Secondary | ICD-10-CM | POA: Diagnosis not present

## 2022-03-14 DIAGNOSIS — Z85828 Personal history of other malignant neoplasm of skin: Secondary | ICD-10-CM | POA: Insufficient documentation

## 2022-03-14 LAB — CBC
HCT: 42.8 % (ref 36.0–46.0)
Hemoglobin: 14.7 g/dL (ref 12.0–15.0)
MCH: 32.6 pg (ref 26.0–34.0)
MCHC: 34.3 g/dL (ref 30.0–36.0)
MCV: 94.9 fL (ref 80.0–100.0)
Platelets: 316 10*3/uL (ref 150–400)
RBC: 4.51 MIL/uL (ref 3.87–5.11)
RDW: 12.1 % (ref 11.5–15.5)
WBC: 9.8 10*3/uL (ref 4.0–10.5)
nRBC: 0 % (ref 0.0–0.2)

## 2022-03-14 LAB — URINALYSIS, ROUTINE W REFLEX MICROSCOPIC
Bilirubin Urine: NEGATIVE
Glucose, UA: NEGATIVE mg/dL
Hgb urine dipstick: NEGATIVE
Ketones, ur: 15 mg/dL — AB
Nitrite: POSITIVE — AB
Protein, ur: NEGATIVE mg/dL
Specific Gravity, Urine: 1.014 (ref 1.005–1.030)
WBC, UA: >50 WBC/hpf — ABNORMAL HIGH (ref 0–5)
pH: 7 (ref 5.0–8.0)

## 2022-03-14 LAB — BASIC METABOLIC PANEL
Anion gap: 16 — ABNORMAL HIGH (ref 5–15)
BUN: 17 mg/dL (ref 6–20)
CO2: 20 mmol/L — ABNORMAL LOW (ref 22–32)
Calcium: 9.2 mg/dL (ref 8.9–10.3)
Chloride: 101 mmol/L (ref 98–111)
Creatinine, Ser: 0.96 mg/dL (ref 0.44–1.00)
GFR, Estimated: >60 mL/min (ref >60)
Glucose, Bld: 143 mg/dL — ABNORMAL HIGH (ref 70–99)
Potassium: 3.4 mmol/L — ABNORMAL LOW (ref 3.5–5.1)
Sodium: 137 mmol/L (ref 135–145)

## 2022-03-14 LAB — TSH: TSH: 3.188 u[IU]/mL (ref 0.350–4.500)

## 2022-03-14 LAB — TROPONIN I (HIGH SENSITIVITY)
Troponin I (High Sensitivity): 29 ng/L — ABNORMAL HIGH (ref <18)
Troponin I (High Sensitivity): 119 ng/L (ref <18)

## 2022-03-14 MED ORDER — SODIUM CHLORIDE 0.9 % IV SOLN: INTRAVENOUS | Status: DC

## 2022-03-14 MED ORDER — ADENOSINE 6 MG/2ML IV SOLN
INTRAVENOUS | Status: AC
  Administered 2022-03-14: 6 mg via INTRAVENOUS
  Filled 2022-03-14: qty 2

## 2022-03-14 MED ORDER — DILTIAZEM HCL 25 MG/5ML IV SOLN
15.0000 mg | Freq: Once | INTRAVENOUS | Status: AC
  Administered 2022-03-14: 15 mg via INTRAVENOUS
  Filled 2022-03-14: qty 5

## 2022-03-14 MED ORDER — ACETAMINOPHEN 325 MG PO TABS
650.0000 mg | ORAL_TABLET | Freq: Four times a day (QID) | ORAL | Status: DC | PRN
  Administered 2022-03-14 – 2022-03-15 (×3): 650 mg via ORAL
  Filled 2022-03-14 (×3): qty 2

## 2022-03-14 MED ORDER — ADENOSINE 6 MG/2ML IV SOLN
12.0000 mg | Freq: Once | INTRAVENOUS | Status: AC
  Administered 2022-03-14: 12 mg via INTRAVENOUS

## 2022-03-14 MED ORDER — ADENOSINE 6 MG/2ML IV SOLN: 6.0000 mg | Freq: Once | INTRAVENOUS | Status: AC

## 2022-03-14 MED ORDER — SODIUM CHLORIDE 0.9 % IV SOLN
1.0000 g | Freq: Once | INTRAVENOUS | Status: AC
  Administered 2022-03-14: 1 g via INTRAVENOUS
  Filled 2022-03-14: qty 10

## 2022-03-14 MED ORDER — ADENOSINE 6 MG/2ML IV SOLN
6.0000 mg | Freq: Once | INTRAVENOUS | Status: AC
  Administered 2022-03-14: 6 mg via INTRAVENOUS

## 2022-03-14 MED ORDER — DILTIAZEM HCL-DEXTROSE 125-5 MG/125ML-% IV SOLN (PREMIX)
5.0000 mg/h | INTRAVENOUS | Status: DC
  Administered 2022-03-14 – 2022-03-15 (×2): 5 mg/h via INTRAVENOUS
  Filled 2022-03-14 (×2): qty 125

## 2022-03-14 NOTE — ED Triage Notes (Signed)
Sob and high heart rate, patient reports she woke up with it this morning. Chest pain

## 2022-03-14 NOTE — ED Provider Notes (Addendum)
[facility] EMERGENCY DEPT Provider Note   CSN: [id number] Arrival date & time: 03/14/22  1311     History  Chief Complaint  Patient presents with   Shortness of Breath    Kara Cooper is a 56 y.o. female.  Patient felt at this time yesterday and slept a lot.  No chest discomfort no palpitations that she was able to notice.  Awoke this morning somewhere around 630 noticed heart rate was going very fast.  Did try some vagal maneuvers at home did get a pause for about 10 minutes and went back into the rapid heart rate.  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  It is associated with shortness of breath and some discomfort in the chest but did not have any pain yesterday or overnight as far she knows.  Heart rate out in triage was about 171.  EKG showed supraventricular tachycardia with a heart rate of 168.  Patient's blood pressure good 129/78 oxygen levels were 99%.  But patient did feel lightheaded.  Past medical history is significant for hypothyroidism secondary to Hashimoto's.  Patient is on Synthroid.  They did increase the amount 1 month ago.  Patient denies any fevers denies any upper respiratory symptoms.  Denies any nausea vomiting or diarrhea.  Patient is not on any blood thinners.  Patient not known to have any cardiac history has never had palpitations before patient never used tobacco products.       Home Medications Prior to Admission medications   Medication Sig Start Date End Date Taking? Authorizing Provider  acetaminophen (TYLENOL) 500 MG tablet Take OTC as directed    [provider]  albuterol (VENTOLIN HFA) 108 (90 Base) MCG/ACT inhaler 2 PUFFS EVERY 6 HOURS AS NEEDED SOB    [provider]  amitriptyline (ELAVIL) 25 MG tablet TAKE 1 TABLET BY MOUTH EVERYDAY AT BEDTIME 02/18/22   Samuel Bouche, NP  APPLE CIDER VINEGAR-GINGER PO Take 2 capsules by mouth in the morning and at  bedtime.    [provider]  buPROPion (WELLBUTRIN XL) 300 MG 24 hr tablet TAKE ONE TABLET BY MOUTH EVERY DAY 02/28/22   Samuel Bouche, NP  cetirizine (ZYRTEC) 10 MG tablet Take 10 mg by mouth daily.    [provider]  estradiol (ESTRACE) 0.1 MG/GM vaginal cream Place vaginally. 08/02/21   [provider]  FLUoxetine (PROZAC) 10 MG capsule Take 1 capsule (10 mg total) by mouth daily. 02/11/22   Samuel Bouche, NP  FLUoxetine (PROZAC) 20 MG capsule Take 1 capsule (20 mg total) by mouth daily. START AFTER FINISHING 1 WEEK OF '10MG'$  DAILY. 02/11/22   Samuel Bouche, NP  fluticasone (FLONASE) 50 MCG/ACT nasal spray USE 2 SPRAYS IN EACH NOSTRIL DAILY AS NEEDED 07/23/11   Tower, Wynelle Fanny, MD  hydrOXYzine (ATARAX) 50 MG tablet Take 0.5-1 tablets (25-50 mg total) by mouth at bedtime and may repeat dose one time if needed. 02/11/22   Samuel Bouche, NP  Ibuprofen 200 MG CAPS  11/25/20   [provider]  levothyroxine (SYNTHROID) 88 MCG tablet Take 1 tablet (88 mcg total) by mouth daily before breakfast. 11/25/21   Samuel Bouche, NP  Multiple Vitamin (MULTIVITAMIN) tablet Take 1 tablet by mouth daily.      [provider]  omeprazole (PRILOSEC) 20 MG capsule Take 20 mg by mouth daily.    [provider]  ondansetron (ZOFRAN-ODT) 8 MG disintegrating tablet  Take 1 tablet (8 mg total) by mouth every 8 (eight) hours as needed for nausea. 06/04/21   Samuel Bouche, NP  SUMAtriptan (IMITREX) 50 MG tablet Take 1 tablet (50 mg total) by mouth daily as needed for migraine. Take one by mouth times one as needed for migraine- max one pill in a day. 01/14/21   Luetta Nutting, DO  valACYclovir (VALTREX) 500 MG tablet Take 1 tablet (500 mg total) by mouth daily. 01/13/21   Silverio Decamp, MD      Allergies    Patient has no known allergies.    Review of Systems   Review of Systems  Constitutional:  Negative for chills and fever.  HENT:  Negative for ear pain and sore throat.    Eyes:  Negative for pain and visual disturbance.  Respiratory:  Positive for shortness of breath. Negative for cough.   Cardiovascular:  Positive for chest pain and palpitations.  Gastrointestinal:  Negative for abdominal pain and vomiting.  Genitourinary:  Negative for dysuria and hematuria.  Musculoskeletal:  Negative for arthralgias and back pain.  Skin:  Negative for color change and rash.  Neurological:  Negative for seizures and syncope.  All other systems reviewed and are negative.   Physical Exam Updated Vital Signs BP 126/65 (BP Location: Right Arm)   Pulse (!) 138   Temp (!) 97.3 F (36.3 C)   Resp 20   Ht 1.575 m ('5\' 2"'$ )   Wt 67.1 kg   LMP 05/03/2017 (Approximate)   SpO2 100%   BMI 27.07 kg/m  Physical Exam Vitals and nursing note reviewed.  Constitutional:      General: She is in acute distress.     Appearance: She is well-developed. She is ill-appearing. She is not diaphoretic.  HENT:     Head: Normocephalic and atraumatic.  Eyes:     Extraocular Movements: Extraocular movements intact.     Conjunctiva/sclera: Conjunctivae normal.     Pupils: Pupils are equal, round, and reactive to light.  Cardiovascular:     Rate and Rhythm: Regular rhythm. Tachycardia present.     Heart sounds: No murmur heard. Pulmonary:     Effort: Pulmonary effort is normal. No respiratory distress.     Breath sounds: Normal breath sounds. No decreased breath sounds, wheezing, rhonchi or rales.  Chest:     Chest wall: No tenderness.  Abdominal:     Palpations: Abdomen is soft.     Tenderness: There is no abdominal tenderness.  Musculoskeletal:        General: No swelling.     Cervical back: Neck supple.     Right lower leg: No edema.     Left lower leg: No edema.  Skin:    General: Skin is warm and dry.     Capillary Refill: Capillary refill takes less than 2 seconds.  Neurological:     General: No focal deficit present.     Mental Status: She is alert and oriented to  person, place, and time.  Psychiatric:        Mood and Affect: Mood normal.     ED Results / Procedures / Treatments   Labs (all labs ordered are listed, but only abnormal results are displayed) Labs Reviewed  BASIC METABOLIC PANEL - Abnormal; Notable for the following components:      Result Value   Potassium 3.4 (*)    CO2 20 (*)    Glucose, Bld 143 (*)    Anion gap 16 (*)  All other components within normal limits  CBC  TSH  TROPONIN I (HIGH SENSITIVITY)    EKG EKG Interpretation  Date/Time:  Monday March 14 2022 13:21:12 EST Ventricular Rate:  168 PR Interval:    QRS Duration: 72 QT Interval:  286 QTC Calculation: 478 R Axis:   91 Text Interpretation: Supraventricular tachycardia Rightward axis Low voltage QRS Cannot rule out Anterior infarct , age undetermined ST & T wave abnormality, consider inferior ischemia Abnormal ECG No previous ECGs available Confirmed by Fredia Sorrow [phone]) on 03/14/2022 1:35:56 PM  Radiology No results found.  Procedures Procedures    Medications Ordered in ED Medications  0.9 %  sodium chloride infusion (has no administration in time range)  diltiazem (CARDIZEM) 125 mg in dextrose 5% 125 mL (1 mg/mL) infusion (5 mg/hr Intravenous New Bag/Given 03/14/22 1426)  adenosine (ADENOCARD) 6 MG/2ML injection 6 mg (6 mg Intravenous Given 03/14/22 1355)  adenosine (ADENOCARD) 6 MG/2ML injection 6 mg (6 mg Intravenous Given 03/14/22 1401)  adenosine (ADENOCARD) 6 MG/2ML injection 12 mg (12 mg Intravenous Given 03/14/22 1410)  adenosine (ADENOCARD) 6 MG/2ML injection 12 mg (12 mg Intravenous Given 03/14/22 1408)  diltiazem (CARDIZEM) injection 15 mg (15 mg Intravenous Given 03/14/22 1421)    ED Course/ Medical Decision Making/ A&P                           Medical Decision Making Amount and/or Complexity of Data Reviewed Labs: ordered. Radiology: ordered.  Risk Prescription drug management. Decision regarding  hospitalization.   CRITICAL CARE Performed by: Fredia Sorrow Total critical care time: 60 minutes Critical care time was exclusive of separately billable procedures and treating other patients. Critical care was necessary to treat or prevent imminent or life-threatening deterioration. Critical care was time spent personally by me on the following activities: development of treatment plan with patient and/or surrogate as well as nursing, discussions with consultants, evaluation of patient's response to treatment, examination of patient, obtaining history from patient or surrogate, ordering and performing treatments and interventions, ordering and review of laboratory studies, ordering and review of radiographic studies, pulse oximetry and re-evaluation of patient's condition.  Patient's rhythm seems to be consistent with supraventricular tachycardia heart rate in the upper 160s.  Patient's blood pressure good.  We put her on cardiac monitor put her on oxygen CO2 monitoring and plan to give adenosine.  Patient had a proximal IV in the left upper extremity.  Patient originally given 6 mg neck in the pausing any feeling.  We did 6 mg again no pause no feeling.  Did 12 mg no pauses no feelings.  Opted to go with a stopcock and gave another 12 got some discomfort in her chest but did not get a pause.  Now we will give 15 mg IV bolus of diltiazem.  Will start diltiazem drip.  We will check patient's TSH.  Patient CBC is normal.  Basic metabolic panel is pending.  And will get chest x-ray.  Patient's heart rate has naturally come down to around 140 but still appears to be a supraventricular tachycardia.  Patient ended up responding well to diltiazem drip.  Heart rate now down around 97.  Discussed with cardiology Trista screener and they will see patient in consult discussed with hospitalist they will admit.  Final Clinical Impression(s) / ED Diagnoses Final diagnoses:  SVT (supraventricular  tachycardia)    Rx / DC Orders ED Discharge Orders     None  Fredia Sorrow, MD 03/14/22 1416    Fredia Sorrow, MD 03/14/22 1428    Fredia Sorrow, MD 03/14/22 1600

## 2022-03-14 NOTE — Plan of Care (Signed)
Transfer from [facility] 56 y/o female who has pmh hypothyroidism presented with palpatations. Thought to be in SVT with hr in the 170's not responsive to several doses of adenosine.  Bolused 50 mg of diltiazem and then placed on drip with improvement in heart rates.  Heart rate currently in the 90s.  Accepted to a progressive bed here at [facility].  Will need to consult cardiology on arrival.

## 2022-03-14 NOTE — ED Notes (Addendum)
Received patient in room 14 breathing spontaneously to room air . Patient is receiving a Cardizem drip going at 7.64m/hr. No s/s of IV complications noted . Patient is currently in normal sinus rhythm . Patient reports a slight chest pain going from her left mid sternal towards her back. Patient denies wanting pain meds at this time. MD SAshok Cordia made aware .  Continuing monitoring and continuing care.

## 2022-03-15 DIAGNOSIS — R7989 Other specified abnormal findings of blood chemistry: Secondary | ICD-10-CM | POA: Diagnosis present

## 2022-03-15 DIAGNOSIS — I471 Supraventricular tachycardia, unspecified: Secondary | ICD-10-CM

## 2022-03-15 DIAGNOSIS — E876 Hypokalemia: Secondary | ICD-10-CM | POA: Diagnosis not present

## 2022-03-15 DIAGNOSIS — Z79899 Other long term (current) drug therapy: Secondary | ICD-10-CM | POA: Diagnosis not present

## 2022-03-15 DIAGNOSIS — E039 Hypothyroidism, unspecified: Secondary | ICD-10-CM | POA: Diagnosis not present

## 2022-03-15 DIAGNOSIS — N390 Urinary tract infection, site not specified: Secondary | ICD-10-CM | POA: Diagnosis not present

## 2022-03-15 DIAGNOSIS — F419 Anxiety disorder, unspecified: Secondary | ICD-10-CM | POA: Diagnosis present

## 2022-03-15 DIAGNOSIS — Z85828 Personal history of other malignant neoplasm of skin: Secondary | ICD-10-CM | POA: Diagnosis not present

## 2022-03-15 DIAGNOSIS — K219 Gastro-esophageal reflux disease without esophagitis: Secondary | ICD-10-CM | POA: Insufficient documentation

## 2022-03-15 DIAGNOSIS — G4700 Insomnia, unspecified: Secondary | ICD-10-CM | POA: Diagnosis not present

## 2022-03-15 DIAGNOSIS — R0602 Shortness of breath: Secondary | ICD-10-CM | POA: Diagnosis not present

## 2022-03-15 LAB — HIV ANTIBODY (ROUTINE TESTING W REFLEX): HIV Screen 4th Generation wRfx: NONREACTIVE

## 2022-03-15 LAB — MAGNESIUM: Magnesium: 2.2 mg/dL (ref 1.7–2.4)

## 2022-03-15 LAB — TROPONIN I (HIGH SENSITIVITY)
Troponin I (High Sensitivity): 30 ng/L — ABNORMAL HIGH (ref <18)
Troponin I (High Sensitivity): 22 ng/L — ABNORMAL HIGH (ref <18)

## 2022-03-15 MED ORDER — SODIUM CHLORIDE 0.9 % IV SOLN: INTRAVENOUS | Status: DC | PRN

## 2022-03-15 MED ORDER — BUPROPION HCL ER (XL) 150 MG PO TB24
300.0000 mg | ORAL_TABLET | Freq: Every day | ORAL | Status: DC
  Administered 2022-03-16: 300 mg via ORAL
  Filled 2022-03-15: qty 2

## 2022-03-15 MED ORDER — DILTIAZEM HCL-DEXTROSE 125-5 MG/125ML-% IV SOLN (PREMIX)
5.0000 mg/h | INTRAVENOUS | Status: DC
  Administered 2022-03-15: 5 mg/h via INTRAVENOUS
  Filled 2022-03-15: qty 125

## 2022-03-15 MED ORDER — HYDROXYZINE HCL 25 MG PO TABS
25.0000 mg | ORAL_TABLET | Freq: Every evening | ORAL | Status: DC | PRN
  Administered 2022-03-15: 25 mg via ORAL
  Filled 2022-03-15: qty 2

## 2022-03-15 MED ORDER — ACETAMINOPHEN 650 MG RE SUPP: 650.0000 mg | Freq: Four times a day (QID) | RECTAL | Status: DC | PRN

## 2022-03-15 MED ORDER — AMITRIPTYLINE HCL 25 MG PO TABS: 25.0000 mg | ORAL_TABLET | Freq: Every day | ORAL | Status: DC

## 2022-03-15 MED ORDER — SUMATRIPTAN SUCCINATE 50 MG PO TABS
50.0000 mg | ORAL_TABLET | Freq: Every day | ORAL | Status: DC | PRN
  Administered 2022-03-15 – 2022-03-16 (×2): 50 mg via ORAL
  Filled 2022-03-15 (×4): qty 1

## 2022-03-15 MED ORDER — LORATADINE 10 MG PO TABS: 10.0000 mg | ORAL_TABLET | Freq: Every day | ORAL | Status: DC | PRN

## 2022-03-15 MED ORDER — ENOXAPARIN SODIUM 40 MG/0.4ML IJ SOSY: 40.0000 mg | PREFILLED_SYRINGE | INTRAMUSCULAR | Status: DC

## 2022-03-15 MED ORDER — ACETAMINOPHEN 325 MG PO TABS
650.0000 mg | ORAL_TABLET | Freq: Four times a day (QID) | ORAL | Status: DC | PRN
  Administered 2022-03-16: 650 mg via ORAL
  Filled 2022-03-15: qty 2

## 2022-03-15 MED ORDER — ALBUTEROL SULFATE (2.5 MG/3ML) 0.083% IN NEBU: 2.5000 mg | INHALATION_SOLUTION | Freq: Four times a day (QID) | RESPIRATORY_TRACT | Status: DC | PRN

## 2022-03-15 MED ORDER — LEVOTHYROXINE SODIUM 88 MCG PO TABS
88.0000 ug | ORAL_TABLET | Freq: Every day | ORAL | Status: DC
  Administered 2022-03-16: 88 ug via ORAL
  Filled 2022-03-15: qty 1

## 2022-03-15 MED ORDER — FLUOXETINE HCL 20 MG PO CAPS
20.0000 mg | ORAL_CAPSULE | Freq: Every day | ORAL | Status: DC
  Administered 2022-03-16: 20 mg via ORAL
  Filled 2022-03-15: qty 1

## 2022-03-15 MED ORDER — POTASSIUM CHLORIDE CRYS ER 20 MEQ PO TBCR
40.0000 meq | EXTENDED_RELEASE_TABLET | ORAL | Status: AC
  Administered 2022-03-15: 40 meq via ORAL
  Filled 2022-03-15: qty 2

## 2022-03-15 MED ORDER — PANTOPRAZOLE SODIUM 40 MG PO TBEC
40.0000 mg | DELAYED_RELEASE_TABLET | Freq: Every day | ORAL | Status: DC
  Administered 2022-03-16: 40 mg via ORAL
  Filled 2022-03-15: qty 1

## 2022-03-15 MED ORDER — SODIUM CHLORIDE 0.9 % IV SOLN
1.0000 g | INTRAVENOUS | Status: DC
  Filled 2022-03-15: qty 10

## 2022-03-15 MED ORDER — SODIUM CHLORIDE 0.9% FLUSH
3.0000 mL | Freq: Two times a day (BID) | INTRAVENOUS | Status: DC
  Administered 2022-03-16: 3 mL via INTRAVENOUS

## 2022-03-15 MED ORDER — FLUTICASONE PROPIONATE 50 MCG/ACT NA SUSP
1.0000 | Freq: Every day | NASAL | Status: DC
  Administered 2022-03-16: 1 via NASAL
  Filled 2022-03-15: qty 16

## 2022-03-15 MED ORDER — MELATONIN 3 MG PO TABS
3.0000 mg | ORAL_TABLET | Freq: Every evening | ORAL | Status: DC | PRN
  Administered 2022-03-15: 3 mg via ORAL
  Filled 2022-03-15: qty 1

## 2022-03-15 MED ORDER — VALACYCLOVIR HCL 500 MG PO TABS
500.0000 mg | ORAL_TABLET | Freq: Every day | ORAL | Status: DC
  Administered 2022-03-15 – 2022-03-16 (×2): 500 mg via ORAL
  Filled 2022-03-15 (×2): qty 1

## 2022-03-15 NOTE — H&P (Addendum)
History and Physical    Patient: Kara Cooper ALP:[id number] DOB: [birth year] DOA: 03/14/2022 DOS: the patient was seen and examined on 03/15/2022 PCP: Samuel Bouche, NP  Patient coming from: Transfer from [place] Complaint:  Chief Complaint  Patient presents with   Shortness of Breath   HPI: Kara Cooper is a 56 y.o. female with medical history significant of hypothyroidism, migraine headaches, and UTIs who presented with complaints of palpitations.  She had been in her normal state of health and reported waking up yesterday morning at around 6:30 AM with headache and chest pounding.  When she checked her heart rates noted they were elevated into the 150s.  Patient tried vagal maneuver with improvement in heart rate down to 136.  She tried drinking water as she thought possibly she was dehydrated and went to work.  While at work patient reported not feeling well and also complained of feeling lightheaded, but denied any loss of consciousness.  At around 12:30 PM she started feeling worse and reported associated symptoms of shortness of breath and substernal pain radiating to her back.  Reports that her levothyroxine has been increased about a month ago and appears to have been started on Prozac around that time.  In the emergency department patient was noted to be afebrile with heart rates elevated up to 171 thought to be in SVT with tachypnea.  Patient had been given 2 doses of adenosine 6 mg and followed up with 2 doses of adenosine 12 mg without improvement in heart rates.  Patient thereafter was started on diltiazem drip.  Labs noted potassium 3.4, and troponin 29->119->30.   Review of Systems: As mentioned in the history of present illness. All other systems reviewed and are negative. Past Medical History:  Diagnosis Date   Acute right pyelonephritis 04/04/2017   Anemia    in past   Basal cell carcinoma    treated with Moh's procedure   Chicken pox    in past    Headache(784.0)    frequent   History of seasonal allergies    Irregular menses    Migraine    Other and unspecified ovarian cysts    come and go   Postural orthostatic tachycardia syndrome    Dr. Burt Knack   UTI (lower urinary tract infection)    history of   Past Surgical History:  Procedure Laterality Date   CERVICAL CONIZATION W/BX  [place] OF UTERUS     Social History:  reports that she has never smoked. She has never used smokeless tobacco. She reports current alcohol use. She reports that she does not use drugs.  No Known Allergies  Family History  Problem Relation Age of Onset   Hyperlipidemia Mother    Heart disease Mother        A-fib, ablation   Migraines Daughter    Diabetes Daughter        type I   Depression Daughter        bipolar    Prior to Admission medications   Medication Sig Start Date End Date Taking? Authorizing Provider  acetaminophen (TYLENOL) 500 MG tablet Take OTC as directed   Yes [provider]  albuterol (VENTOLIN HFA) 108 (90 Base) MCG/ACT inhaler 2 PUFFS EVERY 6 HOURS AS NEEDED SOB   Yes [provider]  amitriptyline (ELAVIL) 25 MG tablet TAKE 1 TABLET BY MOUTH EVERYDAY AT BEDTIME 02/18/22  Yes Samuel Bouche, NP  APPLE CIDER VINEGAR-GINGER PO Take  2 capsules by mouth in the morning and at bedtime.   Yes [provider]  buPROPion (WELLBUTRIN XL) 300 MG 24 hr tablet TAKE ONE TABLET BY MOUTH EVERY DAY 02/28/22  Yes Samuel Bouche, NP  cetirizine (ZYRTEC) 10 MG tablet Take 10 mg by mouth daily.   Yes [provider]  estradiol (ESTRACE) 0.1 MG/GM vaginal cream Place vaginally. 08/02/21  Yes [provider]  FLUoxetine (PROZAC) 20 MG capsule Take 1 capsule (20 mg total) by mouth daily. START AFTER FINISHING 1 WEEK OF '10MG'$  DAILY. 02/11/22  Yes Samuel Bouche, NP  fluticasone (FLONASE) 50 MCG/ACT nasal spray USE 2 SPRAYS IN EACH NOSTRIL DAILY AS NEEDED 07/23/11  Yes Tower, Wynelle Fanny, MD   hydrOXYzine (ATARAX) 50 MG tablet Take 0.5-1 tablets (25-50 mg total) by mouth at bedtime and may repeat dose one time if needed. 02/11/22  Yes Samuel Bouche, NP  Ibuprofen 200 MG CAPS  11/25/20  Yes [provider]  levothyroxine (SYNTHROID) 88 MCG tablet Take 1 tablet (88 mcg total) by mouth daily before breakfast. 11/25/21  Yes Samuel Bouche, NP  Multiple Vitamin (MULTIVITAMIN) tablet Take 1 tablet by mouth daily.     Yes [provider]  omeprazole (PRILOSEC) 20 MG capsule Take 20 mg by mouth daily.   Yes [provider]  SUMAtriptan (IMITREX) 50 MG tablet Take 1 tablet (50 mg total) by mouth daily as needed for migraine. Take one by mouth times one as needed for migraine- max one pill in a day. 01/14/21  Yes Luetta Nutting, DO  valACYclovir (VALTREX) 500 MG tablet Take 1 tablet (500 mg total) by mouth daily. 01/13/21  Yes Silverio Decamp, MD  FLUoxetine (PROZAC) 10 MG capsule Take 1 capsule (10 mg total) by mouth daily. Patient not taking: Reported on 03/15/2022 02/11/22   Samuel Bouche, NP  ondansetron (ZOFRAN-ODT) 8 MG disintegrating tablet Take 1 tablet (8 mg total) by mouth every 8 (eight) hours as needed for nausea. Patient not taking: Reported on 03/15/2022 06/04/21   Samuel Bouche, NP    Physical Exam: Vitals:   03/15/22 0453 03/15/22 0805 03/15/22 1100 03/15/22 1200  BP: 103/69 (!) 94/58 114/71 115/80  Pulse: 70 66 65   Resp: 17 (!) 8 12   Temp: 98.1 F (36.7 C)     TempSrc: Oral     SpO2: 100% 100% 100%   Weight:      Height:        Constitutional: Middle-aged female currently in no acute distress Eyes: PERRL, lids and conjunctivae normal ENMT: Mucous membranes are moist.   Neck: normal, supple.  No JVD. Respiratory: Normal respiratory effort without significant wheezes or rhonchi appreciated.  O2 saturations maintained on room air. Cardiovascular: Regular rate and rhythm with an PVCs trace lower extremity edema.   Abdomen: no tenderness, no  masses palpated.  Bowel sounds positive.  Musculoskeletal: no clubbing / cyanosis. No joint deformity upper and lower extremities. Good ROM, no contractures. Normal muscle tone.  Skin: no rashes, lesions, ulcers. No induration Neurologic: CN 2-12 grossly intact.  Strength 5/5 in all 4.  Psychiatric: Normal judgment and insight. Alert and oriented x 3. Normal mood.   Data Reviewed:  EKG reveals SVT at 168 bpm.  Reviewed labs, imaging, and pertinent records as noted in HPI  Assessment and Plan: SVT Acute.  Patient appeared to be in SVT with heart rates into the 170s.  Patient had been given adenosine 6 mg x 2 heart followed by 12 mg  x 2 doses without improvement in rates.  Patient was started on a Cardizem drip with improvement in heart rates.  Patient appears to be back into a sinus rhythm with intermittent PVCs. -Admit to a progressive bed -Continue Cardizem drip -Check echocardiogram -Goal potassium at least 4 and magnesium at least 2.  Replacing electrolytes as needed -Cardiology consulted,  will follow-up for any further recommendations  Elevated troponin Acute.  High-sensitivity troponin 29->119->30. Thought 2/2 to demand.  Cardiology thinks may warrant coronary CT angiogram in the outpatient setting. -Follow-up echocardiogram  Hypokalemia Acute.  Initial potassium was noted to be 3.4. -Give potassium chloride 40 meq p.o. -Check magnesium -Continue to monitor and replace as needed  Urinary tract infection Acute.  Patient not reporting any symptoms.  Urinalysis positive for moderate leukocytes, positive nitrites, rare bacteria, and greater than 50 WBCs.  She has been started on Rocephin IV. -Continue Rocephin IV day 2 of 3  Anxiety -Continue Prozac and Wellbutrin -Pharmacy consult in regards to possible drug drug interactions with Prozac and amitriptyline which there were some notes of ventricular tachyarrhythmias  Insomnia -Hold amitriptyline do to possible interaction with  Prozac  GERD -Continue pharmacy substitution omeprazole  DVT prophylaxis: Lovenox Advance Care Planning:   Code Status: Full Code   Consults: Cardiology  Family Communication: Daughter updated at bedside  Severity of Illness: The appropriate patient status for this patient is OBSERVATION. Observation status is judged to be reasonable and necessary in order to provide the required intensity of service to ensure the patient's safety. The patient's presenting symptoms, physical exam findings, and initial radiographic and laboratory data in the context of their medical condition is felt to place them at decreased risk for further clinical deterioration. Furthermore, it is anticipated that the patient will be medically stable for discharge from the hospital within 2 midnights of admission.   Author: Norval Morton, MD 03/15/2022 3:37 PM  For on call review [email or web address].

## 2022-03-15 NOTE — Consult Note (Signed)
Cardiology Consultation   Patient ID: Kara Cooper MRN: [id number]; DOB: January 10, 1966  Admit date: 03/14/2022 Date of Consult: 03/15/2022  PCP:  Samuel Bouche, NP   [facility] Cardiologist:  Obadiah Dennard     Patient Profile:   Kara Cooper is a 56 y.o. female with a hx of Hashimotis thyroiditis  who is being seen 03/15/2022 for the evaluation of SVT  at the request of Dr. Tamala Julian .  History of Present Illness:   [name] is a healthy 56 year old female.  She has a history of Hashimoto's thyroiditis.  She presents with an episode of supraventricular tachycardia.  Roxanne noted a rapid HR yesterday when she work up. Tried valvalva - slowed it temporarily, But then HR increased back up to 160s Was in the 160s for 6-7 hours Finally came to the ER  Was given Adenosine several times, would not breat the tachycardia   Eventually was given IV diltiazem and converted to NSR   She is comfortable at present   Denies any previous episodes of SVT.  She denies any chest pain or shortness of breath.  She does have occasional and rare episodes of chest burning that last for a second or so but these are not necessarily consistent with angina.  She is very active.       Past Medical History:  Diagnosis Date   Acute right pyelonephritis 04/04/2017   Anemia    in past   Basal cell carcinoma    treated with Moh's procedure   Chicken pox    in past   Headache(784.0)    frequent   History of seasonal allergies    Irregular menses    Migraine    Other and unspecified ovarian cysts    come and go   Postural orthostatic tachycardia syndrome    Dr. Burt Knack   UTI (lower urinary tract infection)    history of    Past Surgical History:  Procedure Laterality Date   CERVICAL CONIZATION W/BX  [place] OF UTERUS       Home Medications:  Prior to Admission medications   Medication Sig Start Date End Date Taking? Authorizing Provider   acetaminophen (TYLENOL) 500 MG tablet Take OTC as directed   Yes [provider]  albuterol (VENTOLIN HFA) 108 (90 Base) MCG/ACT inhaler 2 PUFFS EVERY 6 HOURS AS NEEDED SOB   Yes [provider]  amitriptyline (ELAVIL) 25 MG tablet TAKE 1 TABLET BY MOUTH EVERYDAY AT BEDTIME 02/18/22  Yes Jessup, Joy, NP  APPLE CIDER VINEGAR-GINGER PO Take 2 capsules by mouth in the morning and at bedtime.   Yes [provider]  buPROPion (WELLBUTRIN XL) 300 MG 24 hr tablet TAKE ONE TABLET BY MOUTH EVERY DAY 02/28/22  Yes Samuel Bouche, NP  cetirizine (ZYRTEC) 10 MG tablet Take 10 mg by mouth daily.   Yes [provider]  estradiol (ESTRACE) 0.1 MG/GM vaginal cream Place vaginally. 08/02/21  Yes [provider]  FLUoxetine (PROZAC) 20 MG capsule Take 1 capsule (20 mg total) by mouth daily. START AFTER FINISHING 1 WEEK OF '10MG'$  DAILY. 02/11/22  Yes Samuel Bouche, NP  fluticasone (FLONASE) 50 MCG/ACT nasal spray USE 2 SPRAYS IN EACH NOSTRIL DAILY AS NEEDED 07/23/11  Yes Tower, Wynelle Fanny, MD  hydrOXYzine (ATARAX) 50 MG tablet Take 0.5-1 tablets (25-50 mg total) by mouth at bedtime and may repeat dose one time if needed. 02/11/22  Yes Samuel Bouche, NP  Ibuprofen 200 MG  CAPS  11/25/20  Yes [provider]  levothyroxine (SYNTHROID) 88 MCG tablet Take 1 tablet (88 mcg total) by mouth daily before breakfast. 11/25/21  Yes Samuel Bouche, NP  Multiple Vitamin (MULTIVITAMIN) tablet Take 1 tablet by mouth daily.     Yes [provider]  omeprazole (PRILOSEC) 20 MG capsule Take 20 mg by mouth daily.   Yes [provider]  SUMAtriptan (IMITREX) 50 MG tablet Take 1 tablet (50 mg total) by mouth daily as needed for migraine. Take one by mouth times one as needed for migraine- max one pill in a day. 01/14/21  Yes Luetta Nutting, DO  valACYclovir (VALTREX) 500 MG tablet Take 1 tablet (500 mg total) by mouth daily. 01/13/21  Yes Silverio Decamp, MD  ondansetron (ZOFRAN-ODT)  8 MG disintegrating tablet Take 1 tablet (8 mg total) by mouth every 8 (eight) hours as needed for nausea. Patient not taking: Reported on 03/15/2022 06/04/21   Samuel Bouche, NP    Inpatient Medications: Scheduled Meds:  amitriptyline  25 mg Oral QHS   buPROPion  300 mg Oral Daily   enoxaparin (LOVENOX) injection  40 mg Subcutaneous Q24H   FLUoxetine  20 mg Oral Daily   fluticasone  1 spray Each Nare Daily   hydrOXYzine  25-50 mg Oral QHS,MR X 1   levothyroxine  88 mcg Oral QAC breakfast   pantoprazole  40 mg Oral Daily   sodium chloride flush  3 mL Intravenous Q12H   valACYclovir  500 mg Oral Daily   Continuous Infusions:  sodium chloride 100 mL/hr at 03/15/22 1306   cefTRIAXone (ROCEPHIN)  IV     diltiazem (CARDIZEM) infusion 5 mg/hr (03/15/22 1709)   PRN Meds: acetaminophen **OR** acetaminophen, albuterol, loratadine, SUMAtriptan  Allergies:   No Known Allergies  Social History:   Social History   Socioeconomic History   Marital status: Single    Spouse name: Not on file   Number of children: 2   Years of education: Not on file   Highest education level: Not on file  Occupational History   Occupation: RN  Tobacco Use   Smoking status: Never   Smokeless tobacco: Never  Substance and Sexual Activity   Alcohol use: Yes    Comment: Occasional   Drug use: No   Sexual activity: Not on file  Other Topics Concern   Not on file  Social History Narrative   Student.  Exercises every day, also cycles.   Social Determinants of Health   Financial Resource Strain: Not on file  Food Insecurity: Not on file  Transportation Needs: Not on file  Physical Activity: Not on file  Stress: Not on file  Social Connections: Not on file  Intimate Partner Violence: Not on file    Family History:    Family History  Problem Relation Age of Onset   Hyperlipidemia Mother    Heart disease Mother        A-fib, ablation   Migraines Daughter    Diabetes Daughter        type I    Depression Daughter        bipolar     ROS:  Please see the history of present illness.   All other ROS reviewed and negative.     Physical Exam/Data:   Vitals:   03/15/22 0805 03/15/22 1100 03/15/22 1200 03/15/22 1545  BP: (!) 94/58 114/71 115/80 118/69  Pulse: 66 65  74  Resp: (!) '8 12  17  '$ Temp:  98.3 F (36.8 C)  TempSrc:    Oral  SpO2: 100% 100%  100%  Weight:      Height:        Intake/Output Summary (Last 24 hours) at 03/15/2022 1800 Last data filed at 03/15/2022 1215 Gross per 24 hour  Intake 1223.46 ml  Output --  Net 1223.46 ml      03/14/2022    1:23 PM 11/23/2021    1:44 PM 03/08/2021    9:04 AM  Last 3 Weights  Weight (lbs) 148 lb 154 lb 12.8 oz 141 lb 8 oz  Weight (kg) 67.132 kg 70.217 kg 64.184 kg     Body mass index is 27.07 kg/m.  General:  Well nourished, well developed, in no acute distress HEENT: normal Neck: no JVD Vascular: No carotid bruits; Distal pulses 2+ bilaterally Cardiac:  normal S1, S2; RRR; no murmur  Lungs:  clear to auscultation bilaterally, no wheezing, rhonchi or rales  Abd: soft, nontender, no hepatomegaly  Ext: no edema Musculoskeletal:  No deformities, BUE and BLE strength normal and equal Skin: warm and dry  Neuro:  CNs 2-12 intact, no focal abnormalities noted Psych:  Normal affect   EKG:    Nov. 27, 1321:  SVT at 186  Nov. 27, at 1442: Junctional tachycardia at 120   Telemetry:  NSR   Relevant CV Studies:   Laboratory Data:  High Sensitivity Troponin:   Recent Labs  Lab 03/14/22 1326 03/14/22 1526 03/15/22 1049  TROPONINIHS 29* 119* 30*     Chemistry Recent Labs  Lab 03/14/22 1326  NA 137  K 3.4*  CL 101  CO2 20*  GLUCOSE 143*  BUN 17  CREATININE 0.96  CALCIUM 9.2  GFRNONAA >60  ANIONGAP 16*    No results for input(s): "PROT", "ALBUMIN", "AST", "ALT", "ALKPHOS", "BILITOT" in the last 168 hours. Lipids No results for input(s): "CHOL", "TRIG", "HDL", "LABVLDL", "LDLCALC", "CHOLHDL" in  the last 168 hours.  Hematology Recent Labs  Lab 03/14/22 1326  WBC 9.8  RBC 4.51  HGB 14.7  HCT 42.8  MCV 94.9  MCH 32.6  MCHC 34.3  RDW 12.1  PLT 316   Thyroid  Recent Labs  Lab 03/14/22 1412  TSH 3.188    BNPNo results for input(s): "BNP", "PROBNP" in the last 168 hours.  DDimer No results for input(s): "DDIMER" in the last 168 hours.   Radiology/Studies:  DG Chest Port 1 View  Result Date: 03/14/2022 CLINICAL DATA:  Palpitation EXAM: PORTABLE CHEST 1 VIEW COMPARISON:  Chest x-ray 02/15/2019 FINDINGS: Cardiac paddles overlie the chest. The heart and mediastinal contours are unchanged. Aortic calcification. Biapical pleural/pulmonary scarring. No focal consolidation. No pulmonary edema. No pleural effusion. No pneumothorax. No acute osseous abnormality. IMPRESSION: 1. No active disease. 2.  Aortic Atherosclerosis (ICD10-I70.0). Electronically Signed   By: Iven Finn M.D.   On: 03/14/2022 15:46     Assessment and Plan:   SVT: She presented with sustained episode of supraventricular tachycardia at 170.  She was given IV adenosine but it did not convert her.  She eventually got IV diltiazem which was successful in converting her to sinus rhythm.  Will keep her on diltiazem drip for the night.  Will anticipate changing her to oral metoprolol tomorrow.  Troponin levels are mildly elevated.  I suspect this is demand ischemia given the 6 or 7 hours she was in SVT.  Will consider doing further workup as an outpatient.  This may include a coronary CT angiogram.  2.  Hyperlipidemia:  Her LDL has been fairly well-controlled.  Her last LDL was 121.  Triglyceride levels have been excellent. There is no family history of coronary artery disease.    Risk Assessment/Risk Scores:           For questions or updates, please contact [place] Please consult [email or web address] for contact info under    Signed, Mertie Moores, MD  03/15/2022 6:00 PM

## 2022-03-15 NOTE — ED Notes (Addendum)
Patient has been A/Ox 4 the entire night . Denies chest pain . No IV complications . Patient has been using the restroom via bedpan . No complaints at this time . Patient remains in NSR while her Cardizem drip continues at 7.51m/hr. Blood pressures have been normotensive. Continuing monitoring and care .

## 2022-03-15 NOTE — ED Notes (Signed)
Report given to [facility] 

## 2022-03-15 NOTE — ED Notes (Signed)
Called [facility] and spoke to [place]; informed that the patient is Bed Ready for transport

## 2022-03-16 ENCOUNTER — Observation Stay (HOSPITAL_COMMUNITY): Payer: BC Managed Care – PPO

## 2022-03-16 ENCOUNTER — Other Ambulatory Visit (HOSPITAL_COMMUNITY): Payer: Self-pay

## 2022-03-16 ENCOUNTER — Observation Stay (HOSPITAL_BASED_OUTPATIENT_CLINIC_OR_DEPARTMENT_OTHER): Payer: BC Managed Care – PPO

## 2022-03-16 DIAGNOSIS — R079 Chest pain, unspecified: Secondary | ICD-10-CM | POA: Diagnosis not present

## 2022-03-16 DIAGNOSIS — I471 Supraventricular tachycardia, unspecified: Secondary | ICD-10-CM | POA: Diagnosis not present

## 2022-03-16 DIAGNOSIS — R7989 Other specified abnormal findings of blood chemistry: Secondary | ICD-10-CM | POA: Diagnosis not present

## 2022-03-16 DIAGNOSIS — R9431 Abnormal electrocardiogram [ECG] [EKG]: Secondary | ICD-10-CM | POA: Diagnosis not present

## 2022-03-16 LAB — BASIC METABOLIC PANEL
Anion gap: 6 (ref 5–15)
BUN: 11 mg/dL (ref 6–20)
CO2: 23 mmol/L (ref 22–32)
Calcium: 8.4 mg/dL — ABNORMAL LOW (ref 8.9–10.3)
Chloride: 108 mmol/L (ref 98–111)
Creatinine, Ser: 0.88 mg/dL (ref 0.44–1.00)
GFR, Estimated: >60 mL/min (ref >60)
Glucose, Bld: 111 mg/dL — ABNORMAL HIGH (ref 70–99)
Potassium: 4 mmol/L (ref 3.5–5.1)
Sodium: 137 mmol/L (ref 135–145)

## 2022-03-16 LAB — ECHOCARDIOGRAM COMPLETE
AR max vel: 2.56 cm2
AV Area VTI: 2.49 cm2
AV Area mean vel: 2.48 cm2
AV Mean grad: 5 mmHg
AV Peak grad: 8.5 mmHg
Ao pk vel: 1.46 m/s
Area-P 1/2: 3.42 cm2
Height: 62 in
S' Lateral: 2.8 cm
Weight: 2368 oz

## 2022-03-16 LAB — CBC
HCT: 38.4 % (ref 36.0–46.0)
Hemoglobin: 13 g/dL (ref 12.0–15.0)
MCH: 32.5 pg (ref 26.0–34.0)
MCHC: 33.9 g/dL (ref 30.0–36.0)
MCV: 96 fL (ref 80.0–100.0)
Platelets: 208 10*3/uL (ref 150–400)
RBC: 4 MIL/uL (ref 3.87–5.11)
RDW: 12.1 % (ref 11.5–15.5)
WBC: 4.9 10*3/uL (ref 4.0–10.5)
nRBC: 0 % (ref 0.0–0.2)

## 2022-03-16 MED ORDER — PROPRANOLOL HCL 10 MG PO TABS
10.0000 mg | ORAL_TABLET | Freq: Four times a day (QID) | ORAL | 3 refills | Status: DC | PRN
  Filled 2022-03-16 (×2): qty 120, 30d supply, fill #0

## 2022-03-16 MED ORDER — IOHEXOL 350 MG/ML SOLN
100.0000 mL | Freq: Once | INTRAVENOUS | Status: AC | PRN
  Administered 2022-03-16: 100 mL via INTRAVENOUS

## 2022-03-16 MED ORDER — METOPROLOL SUCCINATE ER 25 MG PO TB24
25.0000 mg | ORAL_TABLET | Freq: Every day | ORAL | 11 refills | Status: DC
  Filled 2022-03-16: qty 30, 30d supply, fill #0

## 2022-03-16 MED ORDER — SODIUM CHLORIDE 0.9 % IV SOLN: INTRAVENOUS | Status: AC

## 2022-03-16 MED ORDER — CEFDINIR 300 MG PO CAPS
300.0000 mg | ORAL_CAPSULE | Freq: Two times a day (BID) | ORAL | 0 refills | Status: AC
  Filled 2022-03-16: qty 20, 10d supply, fill #0

## 2022-03-16 MED ORDER — METOPROLOL TARTRATE 50 MG PO TABS
50.0000 mg | ORAL_TABLET | Freq: Two times a day (BID) | ORAL | Status: DC
  Filled 2022-03-16: qty 1

## 2022-03-16 MED ORDER — NITROGLYCERIN 0.4 MG SL SUBL
SUBLINGUAL_TABLET | SUBLINGUAL | Status: AC
  Administered 2022-03-16: 0.8 mg
  Filled 2022-03-16: qty 2

## 2022-03-16 MED ORDER — METOPROLOL TARTRATE 50 MG PO TABS
50.0000 mg | ORAL_TABLET | Freq: Two times a day (BID) | ORAL | 3 refills | Status: DC
  Filled 2022-03-16: qty 60, 30d supply, fill #0

## 2022-03-16 MED ORDER — PROPRANOLOL HCL 10 MG PO TABS: 10.0000 mg | ORAL_TABLET | Freq: Four times a day (QID) | ORAL | Status: DC | PRN

## 2022-03-16 MED ORDER — METOPROLOL TARTRATE 5 MG/5ML IV SOLN
INTRAVENOUS | Status: AC
  Administered 2022-03-16: 5 mg
  Filled 2022-03-16: qty 5

## 2022-03-16 NOTE — Progress Notes (Signed)
Rounding Note    Patient Name: Kara Cooper Date of Encounter: 03/16/2022  [facility] Cardiologist: Manus Weedman   Subjective   Kara Cooper is a 56 year old female with a history of migraine headaches and Hashimoto's thyroiditis.  She was admitted to the hospital with rapid supraventricular tachycardia.  She was in SVT for approximately 6 or 7 hours.  She ultimately had an elevated troponin level of 119.  She denies any real chest pain but has had some chest burning.  She has been on a diltiazem drip through the night and has not had any recurrent episodes of SVT.  Our plan is to do a coronary CT angiogram this morning and if that is okay we will discharge her to home on Toprol-XL 25 mg a day.   Inpatient Medications    Scheduled Meds:  buPROPion  300 mg Oral Daily   enoxaparin (LOVENOX) injection  40 mg Subcutaneous Q24H   FLUoxetine  20 mg Oral Daily   fluticasone  1 spray Each Nare Daily   hydrOXYzine  25-50 mg Oral QHS,MR X 1   levothyroxine  88 mcg Oral QAC breakfast   metoprolol tartrate  50 mg Oral BID   pantoprazole  40 mg Oral Daily   sodium chloride flush  3 mL Intravenous Q12H   valACYclovir  500 mg Oral Daily   Continuous Infusions:  sodium chloride     cefTRIAXone (ROCEPHIN)  IV Stopped (03/15/22 2300)   PRN Meds: sodium chloride, acetaminophen **OR** acetaminophen, albuterol, loratadine, melatonin, SUMAtriptan   Vital Signs    Vitals:   03/15/22 1545 03/15/22 2135 03/16/22 0554 03/16/22 0756  BP: 118/69 101/66 110/72 (!) 95/58  Pulse: 74 75 76 65  Resp: '17 18 16   '$ Temp: 98.3 F (36.8 C) 98.4 F (36.9 C) 98.4 F (36.9 C)   TempSrc: Oral Oral Oral   SpO2: 100% 99% 99%   Weight:      Height:        Intake/Output Summary (Last 24 hours) at 03/16/2022 0839 Last data filed at 03/15/2022 2359 Gross per 24 hour  Intake 1159.74 ml  Output --  Net 1159.74 ml      03/14/2022    1:23 PM 11/23/2021    1:44 PM 03/08/2021    9:04 AM  Last 3  Weights  Weight (lbs) 148 lb 154 lb 12.8 oz 141 lb 8 oz  Weight (kg) 67.132 kg 70.217 kg 64.184 kg      Telemetry    NSR  - Personally Reviewed  ECG     - Personally Reviewed  Physical Exam   GEN: No acute distress.   Neck: No JVD Cardiac: RRR, soft flow murmur  Respiratory: Clear to auscultation bilaterally. GI: Soft, nontender, non-distended  MS: No edema; No deformity. Neuro:  Nonfocal  Psych: Normal affect   Labs    High Sensitivity Troponin:   Recent Labs  Lab 03/14/22 1326 03/14/22 1526 03/15/22 1049 03/15/22 1551  TROPONINIHS 29* 119* 30* 22*     Chemistry Recent Labs  Lab 03/14/22 1326 03/15/22 1710 03/16/22 0340  NA 137  --  137  K 3.4*  --  4.0  CL 101  --  108  CO2 20*  --  23  GLUCOSE 143*  --  111*  BUN 17  --  11  CREATININE 0.96  --  0.88  CALCIUM 9.2  --  8.4*  MG  --  2.2  --   GFRNONAA >60  --  >60  ANIONGAP 16*  --  6    Lipids No results for input(s): "CHOL", "TRIG", "HDL", "LABVLDL", "LDLCALC", "CHOLHDL" in the last 168 hours.  Hematology Recent Labs  Lab 03/14/22 1326 03/16/22 0340  WBC 9.8 4.9  RBC 4.51 4.00  HGB 14.7 13.0  HCT 42.8 38.4  MCV 94.9 96.0  MCH 32.6 32.5  MCHC 34.3 33.9  RDW 12.1 12.1  PLT 316 208   Thyroid  Recent Labs  Lab 03/14/22 1412  TSH 3.188    BNPNo results for input(s): "BNP", "PROBNP" in the last 168 hours.  DDimer No results for input(s): "DDIMER" in the last 168 hours.   Radiology    DG Chest Port 1 View  Result Date: 03/14/2022 CLINICAL DATA:  Palpitation EXAM: PORTABLE CHEST 1 VIEW COMPARISON:  Chest x-ray 02/15/2019 FINDINGS: Cardiac paddles overlie the chest. The heart and mediastinal contours are unchanged. Aortic calcification. Biapical pleural/pulmonary scarring. No focal consolidation. No pulmonary edema. No pleural effusion. No pneumothorax. No acute osseous abnormality. IMPRESSION: 1. No active disease. 2.  Aortic Atherosclerosis (ICD10-I70.0). Electronically Signed   By:  Iven Finn M.D.   On: 03/14/2022 15:46    Cardiac Studies     Patient Profile     56 y.o. female with SVT  Assessment & Plan     SVT :   She has not had any recurrent episodes of SVT.  She has been off the diltiazem drip for about an hour.  The plan is to get DC her on Topprol XL 25 mg a day . Also will order propranolol 10 mg QID PRN breakthrough tachycardia   2.  Elevated troponin levels: She presented with elevated troponin levels.  They peaked at 119.  I suspect these were due to demand ischemia in the setting of having rapid SVT for 6 or 7 hours.  Just to be sure that she does not have significant coronary artery disease we will schedule her for a coronary CT angiogram.  We hope to get that scan today.  If it cannot be done today I think that we can still discharge her to home and schedule this as an outpatient.  3.  History of migraine headaches: She received Imitrex today.   4.  History of Hashimoto's thyroiditis: Continue Synthroid.       For questions or updates, please contact [place] Please consult [email or web address] for contact info under        Signed, Mertie Moores, MD  03/16/2022, 8:39 AM

## 2022-03-16 NOTE — Progress Notes (Addendum)
PROGRESS NOTE    Kara Cooper  FHL:[id number] DOB: January 19, 1966 DOA: 03/14/2022 PCP: Samuel Bouche, NP   Brief Narrative: 80 with past medical history significant for hypothyroidism, migraine, headache, UTI presents complaining of palpitation, found to have SVT, also found to have UTI/ Pyelonephritis.   Started on metoprolol, heart rate better controlled.  She received 2 doses of IV ceftriaxone.   Assessment & Plan:   Principal Problem:   SVT (supraventricular tachycardia) Active Problems:   Elevated troponin   UTI (urinary tract infection)   Anxiety   Insomnia   GERD (gastroesophageal reflux disease)  1-SVT;  Rate controlled on Metoprolol.  Cardiology ordering Coronary CT and ECHO.  If normal plan to discharge home today.   2-UTI/Pyelonephritis;  She report left Flank pain, she was having difficulty initiating stream to urinate.  UA with more than 50 WBC>  Urine culture not send, she has received IV antibiotics.  Plan to discharge on 10 days of cefdinir 300 mg BID for Pyelonephritis.   3-Hypokalemia; replaced.  4-Anxiety; Continue with Prozac and Wellbutrim.   Cardiology will take over patient care.   Estimated body mass index is 27.07 kg/m as calculated from the following:   Height as of this encounter: '5\' 2"'$  (1.575 m).   Weight as of this encounter: 67.1 kg.    Procedures:  ECHO  Antimicrobials:    Subjective: She is feeling well. She report left flank pain and difficulty initiating urination. Since she has been on antibiotics, symptoms have improved. She has been tolerating diet.    Objective: Vitals:   03/15/22 1545 03/15/22 2135 03/16/22 0554 03/16/22 0756  BP: 118/69 101/66 110/72 (!) 95/58  Pulse: 74 75 76 65  Resp: '17 18 16   '$ Temp: 98.3 F (36.8 C) 98.4 F (36.9 C) 98.4 F (36.9 C)   TempSrc: Oral Oral Oral   SpO2: 100% 99% 99%   Weight:      Height:        Intake/Output Summary (Last 24 hours) at 03/16/2022 0948 Last data filed at  03/15/2022 2359 Gross per 24 hour  Intake 1159.74 ml  Output --  Net 1159.74 ml   Filed Weights   03/14/22 1323  Weight: 67.1 kg    Examination:  General exam: Appears calm and comfortable  Respiratory system: Respiratory effort normal. Central nervous system: Alert and oriented.  Psychiatry: Mood & affect appropriate.     Data Reviewed: I have personally reviewed following labs and imaging studies  CBC: Recent Labs  Lab 03/14/22 1326 03/16/22 0340  WBC 9.8 4.9  HGB 14.7 13.0  HCT 42.8 38.4  MCV 94.9 96.0  PLT 316 373   Basic Metabolic Panel: Recent Labs  Lab 03/14/22 1326 03/15/22 1710 03/16/22 0340  NA 137  --  137  K 3.4*  --  4.0  CL 101  --  108  CO2 20*  --  23  GLUCOSE 143*  --  111*  BUN 17  --  11  CREATININE 0.96  --  0.88  CALCIUM 9.2  --  8.4*  MG  --  2.2  --    GFR: Estimated Creatinine Clearance: 65.6 mL/min (by C-G formula based on SCr of 0.88 mg/dL). Liver Function Tests: No results for input(s): "AST", "ALT", "ALKPHOS", "BILITOT", "PROT", "ALBUMIN" in the last 168 hours. No results for input(s): "LIPASE", "AMYLASE" in the last 168 hours. No results for input(s): "AMMONIA" in the last 168 hours. Coagulation Profile: No results for input(s): "INR", "PROTIME" in  the last 168 hours. Cardiac Enzymes: No results for input(s): "CKTOTAL", "CKMB", "CKMBINDEX", "TROPONINI" in the last 168 hours. BNP (last 3 results) No results for input(s): "PROBNP" in the last 8760 hours. HbA1C: No results for input(s): "HGBA1C" in the last 72 hours. CBG: No results for input(s): "GLUCAP" in the last 168 hours. Lipid Profile: No results for input(s): "CHOL", "HDL", "LDLCALC", "TRIG", "CHOLHDL", "LDLDIRECT" in the last 72 hours. Thyroid Function Tests: Recent Labs    03/14/22 1412  TSH 3.188   Anemia Panel: No results for input(s): "VITAMINB12", "FOLATE", "FERRITIN", "TIBC", "IRON", "RETICCTPCT" in the last 72 hours. Sepsis Labs: No results for  input(s): "PROCALCITON", "LATICACIDVEN" in the last 168 hours.  No results found for this or any previous visit (from the past 240 hour(s)).       Radiology Studies: DG Chest Port 1 View  Result Date: 03/14/2022 CLINICAL DATA:  Palpitation EXAM: PORTABLE CHEST 1 VIEW COMPARISON:  Chest x-ray 02/15/2019 FINDINGS: Cardiac paddles overlie the chest. The heart and mediastinal contours are unchanged. Aortic calcification. Biapical pleural/pulmonary scarring. No focal consolidation. No pulmonary edema. No pleural effusion. No pneumothorax. No acute osseous abnormality. IMPRESSION: 1. No active disease. 2.  Aortic Atherosclerosis (ICD10-I70.0). Electronically Signed   By: Iven Finn M.D.   On: 03/14/2022 15:46        Scheduled Meds:  buPROPion  300 mg Oral Daily   enoxaparin (LOVENOX) injection  40 mg Subcutaneous Q24H   FLUoxetine  20 mg Oral Daily   fluticasone  1 spray Each Nare Daily   hydrOXYzine  25-50 mg Oral QHS,MR X 1   levothyroxine  88 mcg Oral QAC breakfast   metoprolol tartrate  50 mg Oral BID   pantoprazole  40 mg Oral Daily   sodium chloride flush  3 mL Intravenous Q12H   valACYclovir  500 mg Oral Daily   Continuous Infusions:  sodium chloride     sodium chloride 250 mL/hr at 03/16/22 0848   cefTRIAXone (ROCEPHIN)  IV Stopped (03/15/22 2300)     LOS: 0 days    Time spent: 35 minutes    Jodilyn Giese A Donivan Thammavong, MD [facility]   If 7PM-7AM, please contact night-coverage [email or web address]  03/16/2022, 9:48 AM

## 2022-03-16 NOTE — Progress Notes (Incomplete)
Echocardiogram 2D Echocardiogram has been performed.  Kara Cooper 03/16/2022, 10:14 AM

## 2022-03-16 NOTE — Discharge Summary (Cosign Needed)
Discharge Summary    Patient ID: Kara Cooper MRN: [id number]; DOB: August 12, 1965  Admit date: 03/14/2022 Discharge date: 03/16/2022  PCP:  Samuel Bouche, NP   [place] Providers Cardiologist:  Mertie Moores, MD        Discharge Diagnoses    Principal Problem:   SVT (supraventricular tachycardia) Active Problems:   Elevated troponin   UTI (urinary tract infection)   Anxiety   Insomnia   GERD (gastroesophageal reflux disease)    Diagnostic Studies/Procedures   Echocardiogram completed 03/16/2022 1. Left ventricular ejection fraction, by estimation, is 55 to 60%. The  left ventricle has normal function. The left ventricle has no regional  wall motion abnormalities. Left ventricular diastolic parameters were  normal.   2. Right ventricular systolic function is normal. The right ventricular  size is normal.   3. No evidence of mitral valve regurgitation.   4. The aortic valve was not well visualized. Aortic valve regurgitation  is not visualized. No aortic stenosis is present.   5. The inferior vena cava is normal in size with greater than 50%  respiratory variability, suggesting right atrial pressure of 3 mmHg.   Coronary CTA completed 03/16/2022  IMPRESSION: 1. Coronary calcium score of 0.   2. Normal coronary origin with right dominance.   3. No evidence of CAD.   _____________   History of Present Illness     Kara Cooper is a 56 y.o. female with a history of Hashimoto thyroiditis, migraine headaches, and previous UTI's, who was seen and evaluated for SVT.   Hospital Course     Consultants: none   Kara Cooper is a 56 year old female that arrived to the emergency department at [facility] on 03/14/22 with complaints of shortness of breath. She was in her usual state of health but had been sleeping more than usual. She denied any chest pain or palpitations.  She woke around 630 in the morning noticing that her  heart rate was fast. She attempted vagal maneuvers and broke for about 10 minutes. On arrival heart reate was 171. EKG revealed SVT with a rate of 168. Blood pressure was stable. Pertinent labs were potassium 3.4 and high sensitivity troponins 28-119-30.Medications administered in the emergency department were adenosine 6 mg x 2 doses and 12 mg x 2 doses, bolused with 15 mg of diltiazem and started on diltiazem drip. After being on diltiazem drip finally dropped heart rate back into the 90's. She was then transferred to [facility] for admission via [place]. She was left on diltiazem overnight and then stopped. Since that time she has remained in sinus without any further episodes of SVT. She did have elevated high sensitive troponins that trended flat, likely secondary to demand ischemia. She will undergo coronary CTA today and pending the findings she will be discharged on Toprol XL 25 mg daily and propanolol 10 mg qid as needed to palpitations and SVT. She has also been treated for a UTI that was found on urinalysis even though she was asymptomatic.  She did receive Rocephin IV x2 doses.  After discussion with Dr. Tyrell Antonio the patient is being transitioned from IV Rocephin to oral cefdinir 300 mg bid for 10 days for possible Pyelonephritis per medicine recommendation. Echocardiogram has been completed with LVEF 55-60%, no regional wall motion abnormalities, and no valvular abnormalities.  On arrival she was also found to be hypokalemic with an initial potassium of 3.4.  Electrolytes have been repleted she was given 40  mEq of potassium and magnesium 2.2.  Repeat potassium level this morning was 4.0.  She is also being continued on her home medications for anxiety and insomnia.      Did the patient have an acute coronary syndrome (MI, NSTEMI, STEMI, etc) this admission?:  No                               Did the patient have a percutaneous coronary intervention (stent / angioplasty)?:  No.    _____________  Discharge Vitals Blood pressure 99/61, pulse 73, temperature 97.8 F (36.6 C), temperature source Oral, resp. rate 16, height '5\' 2"'$  (1.575 m), weight 67.1 kg, last menstrual period 05/03/2017, SpO2 99 %.  Filed Weights   03/14/22 1323  Weight: 67.1 kg    Labs & Radiologic Studies    CBC Recent Labs    03/14/22 1326 03/16/22 0340  WBC 9.8 4.9  HGB 14.7 13.0  HCT 42.8 38.4  MCV 94.9 96.0  PLT 316 374   Basic Metabolic Panel Recent Labs    03/14/22 1326 03/15/22 1710 03/16/22 0340  NA 137  --  137  K 3.4*  --  4.0  CL 101  --  108  CO2 20*  --  23  GLUCOSE 143*  --  111*  BUN 17  --  11  CREATININE 0.96  --  0.88  CALCIUM 9.2  --  8.4*  MG  --  2.2  --    Liver Function Tests No results for input(s): "AST", "ALT", "ALKPHOS", "BILITOT", "PROT", "ALBUMIN" in the last 72 hours. No results for input(s): "LIPASE", "AMYLASE" in the last 72 hours. High Sensitivity Troponin:   Recent Labs  Lab 03/14/22 1326 03/14/22 1526 03/15/22 1049 03/15/22 1551  TROPONINIHS 29* 119* 30* 22*    BNP Invalid input(s): "POCBNP" D-Dimer No results for input(s): "DDIMER" in the last 72 hours. Hemoglobin A1C No results for input(s): "HGBA1C" in the last 72 hours. Fasting Lipid Panel No results for input(s): "CHOL", "HDL", "LDLCALC", "TRIG", "CHOLHDL", "LDLDIRECT" in the last 72 hours. Thyroid Function Tests Recent Labs    03/14/22 1412  TSH 3.188   _____________  CT CORONARY MORPH W/CTA COR W/SCORE W/CA W/CM &/OR WO/CM  Addendum Date: 03/16/2022   ADDENDUM REPORT: 03/16/2022 13:31 CLINICAL DATA:  56 year old with chest pain EXAM: Cardiac/Coronary  CTA TECHNIQUE: The patient was scanned on a [employer]. FINDINGS: A 70 kV prospective scan was triggered in the descending thoracic aorta at 111 HU's. Axial non-contrast 3 mm slices were carried out through the heart. The data set was analyzed on a dedicated work station and scored using the [place].  Gantry rotation speed was 250 msecs and collimation was .6 mm. 0.8 mg of sl NTG was given. The 3D data set was reconstructed in 5% intervals of the 67-82 % of the R-R cycle. Diastolic phases were analyzed on a dedicated work station using MPR, MIP and VRT modes. The patient received 80 cc of contrast. Image quality: good Aorta:  Normal size.  No calcifications.  No dissection. Aortic Valve:  Trileaflet.  No calcifications. Coronary Arteries:  Normal coronary origin.  Right dominance. RCA is a large dominant artery that gives rise to PDA and PLA. There is no plaque. Left main is a large artery that gives rise to LAD and LCX arteries. LAD is a large vessel that has no plaque. D1 is moderate sized and normal.  LCX is a non-dominant artery that gives rise to one large OM1 branch. 2 other small OM branches are noted. There is no plaque. Other findings: Normal pulmonary vein drainage into the left atrium. Normal left atrial appendage without a thrombus. Normal size of the pulmonary artery. Please see radiology report for non cardiac findings. IMPRESSION: 1. Coronary calcium score of 0. 2. Normal coronary origin with right dominance. 3. No evidence of CAD. Electronically Signed   By: Candee Furbish M.D.   On: 03/16/2022 13:31   Result Date: 03/16/2022 EXAM: OVER-READ INTERPRETATION  CT CHEST The following report is a limited chest CT over-read performed by radiologist Dr. Suzy Bouchard of [facility], [place] on 03/16/2022. This over-read does not include interpretation of cardiac or coronary anatomy or pathology. The calcium score interpretation by the cardiologist is attached. COMPARISON:  None Available. FINDINGS: Limited view of the lung parenchyma demonstrates no suspicious nodularity. Airways are normal. Limited view of the mediastinum demonstrates no adenopathy. Esophagus normal. Limited view of the upper abdomen unremarkable. Limited view of the skeleton and chest wall is unremarkable. IMPRESSION: Limited  view of the lung parenchyma demonstrates no suspicious nodularity. Airways are normal. Limited view of the mediastinum demonstrates no adenopathy. Esophagus normal. Limited view of the upper abdomen unremarkable. Limited view of the skeleton and chest wall is unremarkable. Electronically Signed: By: Suzy Bouchard M.D. On: 03/16/2022 13:01   ECHOCARDIOGRAM COMPLETE  Result Date: 03/16/2022    ECHOCARDIOGRAM REPORT   Patient Name:   Kara Cooper Date of Exam: 03/16/2022 Medical Rec #:  [id number]     Height:       62.0 in Accession #:    [id number]    Weight:       148.0 lb Date of Birth:  04-Mar-1966     BSA:          1.682 m Patient Age:    10 years      BP:           110/72 mmHg Patient Gender: F             HR:           67 bpm. Exam Location:  Inpatient Procedure: 2D Echo, Color Doppler and Cardiac Doppler Indications:    Chest Pain R07.9  History:        Patient has prior history of Echocardiogram examinations, most                 recent 01/10/2011. Arrythmias:Tachycardia;                 Signs/Symptoms:Syncope.  Sonographer:    Ronny Flurry Referring Phys: [id number] RONDELL A SMITH IMPRESSIONS  1. Left ventricular ejection fraction, by estimation, is 55 to 60%. The left ventricle has normal function. The left ventricle has no regional wall motion abnormalities. Left ventricular diastolic parameters were normal.  2. Right ventricular systolic function is normal. The right ventricular size is normal.  3. No evidence of mitral valve regurgitation.  4. The aortic valve was not well visualized. Aortic valve regurgitation is not visualized. No aortic stenosis is present.  5. The inferior vena cava is normal in size with greater than 50% respiratory variability, suggesting right atrial pressure of 3 mmHg. Comparison(s): No prior Echocardiogram. FINDINGS  Left Ventricle: Left ventricular ejection fraction, by estimation, is 55 to 60%. The left ventricle has normal function. The left ventricle has no regional  wall motion abnormalities. The left ventricular internal cavity size was normal  in size. There is  no left ventricular hypertrophy. Left ventricular diastolic parameters were normal. Right Ventricle: The right ventricular size is normal. Right ventricular systolic function is normal. Left Atrium: Left atrial size was normal in size. Right Atrium: Right atrial size was normal in size. Pericardium: There is no evidence of pericardial effusion. Mitral Valve: No evidence of mitral valve regurgitation. Tricuspid Valve: Tricuspid valve regurgitation is not demonstrated. Aortic Valve: The aortic valve was not well visualized. Aortic valve regurgitation is not visualized. No aortic stenosis is present. Aortic valve mean gradient measures 5.0 mmHg. Aortic valve peak gradient measures 8.5 mmHg. Aortic valve area, by VTI measures 2.49 cm. Pulmonic Valve: Pulmonic valve regurgitation is not visualized. Aorta: The aortic root and ascending aorta are structurally normal, with no evidence of dilitation. Venous: The inferior vena cava is normal in size with greater than 50% respiratory variability, suggesting right atrial pressure of 3 mmHg. IAS/Shunts: No atrial level shunt detected by color flow Doppler.  LEFT VENTRICLE PLAX 2D LVIDd:         3.90 cm   Diastology LVIDs:         2.80 cm   LV e' medial:    9.46 cm/s LV PW:         0.70 cm   LV E/e' medial:  6.8 LV IVS:        0.70 cm   LV e' lateral:   14.00 cm/s LVOT diam:     2.00 cm   LV E/e' lateral: 4.6 LV SV:         83 LV SV Index:   49 LVOT Area:     3.14 cm  RIGHT VENTRICLE             IVC RV Basal diam:  3.40 cm     IVC diam: 1.90 cm RV S prime:     13.60 cm/s TAPSE (M-mode): 2.3 cm LEFT ATRIUM             Index        RIGHT ATRIUM           Index LA diam:        3.20 cm 1.90 cm/m   RA Area:     13.90 cm LA Vol (A2C):   32.0 ml 18.99 ml/m  RA Volume:   33.20 ml  19.74 ml/m LA Vol (A4C):   20.2 ml 12.01 ml/m LA Biplane Vol: 28.7 ml 17.06 ml/m  AORTIC VALVE AV Area  (Vmax):    2.56 cm AV Area (Vmean):   2.48 cm AV Area (VTI):     2.49 cm AV Vmax:           146.00 cm/s AV Vmean:          102.000 cm/s AV VTI:            0.334 m AV Peak Grad:      8.5 mmHg AV Mean Grad:      5.0 mmHg LVOT Vmax:         119.00 cm/s LVOT Vmean:        80.500 cm/s LVOT VTI:          0.265 m LVOT/AV VTI ratio: 0.79  AORTA Ao Root diam: 2.90 cm Ao Asc diam:  3.00 cm MITRAL VALVE MV Area (PHT): 3.42 cm    SHUNTS MV Decel Time: 222 msec    Systemic VTI:  0.26 m MV E velocity: 64.50 cm/s  Systemic Diam: 2.00 cm MV  A velocity: 55.80 cm/s MV E/A ratio:  1.16 Phineas Inches Electronically signed by Phineas Inches Signature Date/Time: 03/16/2022/10:27:21 AM    Final    DG Chest Port 1 View  Result Date: 03/14/2022 CLINICAL DATA:  Palpitation EXAM: PORTABLE CHEST 1 VIEW COMPARISON:  Chest x-ray 02/15/2019 FINDINGS: Cardiac paddles overlie the chest. The heart and mediastinal contours are unchanged. Aortic calcification. Biapical pleural/pulmonary scarring. No focal consolidation. No pulmonary edema. No pleural effusion. No pneumothorax. No acute osseous abnormality. IMPRESSION: 1. No active disease. 2.  Aortic Atherosclerosis (ICD10-I70.0). Electronically Signed   By: Iven Finn M.D.   On: 03/14/2022 15:46   Disposition   Patient seen by Dr. Acie Fredrickson today and deemed stable for discharge. Pt is being discharged home today in good condition.  Follow-up Plans & Appointments   DEC  18 Office Visit with Mertie Moores, MD Monday Apr 04, 2022 10:40 AM (Arrive by 10:25 AM) Please arrive 15 minutes prior to your appointment. This will allow [place] to verify and update your medical record and ensure a full appointment for you within the time allotted. [place]. [facility] [address], [place] [id number] [place] [address] [phone]       Discharge Medications   Allergies as of 03/16/2022   No Known Allergies       Medication List     STOP taking these medications    ondansetron 8 MG disintegrating tablet Commonly known as: ZOFRAN-ODT       TAKE these medications    acetaminophen 500 MG tablet Commonly known as: TYLENOL Take OTC as directed   albuterol 108 (90 Base) MCG/ACT inhaler Commonly known as: VENTOLIN HFA 2 PUFFS EVERY 6 HOURS AS NEEDED SOB   amitriptyline 25 MG tablet Commonly known as: ELAVIL TAKE 1 TABLET BY MOUTH EVERYDAY AT BEDTIME   APPLE CIDER VINEGAR-GINGER PO Take 2 capsules by mouth in the morning and at bedtime.   buPROPion 300 MG 24 hr tablet Commonly known as: WELLBUTRIN XL TAKE ONE TABLET BY MOUTH EVERY DAY   cefdinir 300 MG capsule Commonly known as: OMNICEF Take 1 capsule (300 mg total) by mouth 2 (two) times daily for 10 days.   cetirizine 10 MG tablet Commonly known as: ZYRTEC Take 10 mg by mouth daily.   estradiol 0.1 MG/GM vaginal cream Commonly known as: ESTRACE Place vaginally.   FLUoxetine 20 MG capsule Commonly known as: PROZAC Take 1 capsule (20 mg total) by mouth daily. START AFTER FINISHING 1 WEEK OF '10MG'$  DAILY.   fluticasone 50 MCG/ACT nasal spray Commonly known as: FLONASE USE 2 SPRAYS IN EACH NOSTRIL DAILY AS NEEDED   hydrOXYzine 50 MG tablet Commonly known as: ATARAX Take 0.5-1 tablets (25-50 mg total) by mouth at bedtime and may repeat dose one time if needed.   Ibuprofen 200 MG Caps   levothyroxine 88 MCG tablet Commonly known as: SYNTHROID Take 1 tablet (88 mcg total) by mouth daily before breakfast.   metoprolol succinate 25 MG 24 hr tablet Commonly known as: Toprol XL Take 1 tablet (25 mg total) by mouth daily.   multivitamin tablet Take 1 tablet by mouth daily.   omeprazole 20 MG capsule Commonly known as: PRILOSEC Take 20 mg by mouth daily.   propranolol 10 MG tablet Commonly known as: INDERAL Take 1 tablet (10 mg total) by mouth 4 (four) times daily as needed (SVT).   SUMAtriptan 50 MG tablet Commonly  known as: IMITREX  Take 1 tablet (50 mg total) by mouth daily as needed for migraine. Take one by mouth times one as needed for migraine- max one pill in a day.   valACYclovir 500 MG tablet Commonly known as: VALTREX Take 1 tablet (500 mg total) by mouth daily.           Outstanding Labs/Studies    Duration of Discharge Encounter   Greater than 30 minutes including physician time.  Signed, Payge Eppes, NP 03/16/2022, 1:35 PM   Attending Note:   The patient was seen and examined.  Agree with assessment and plan as noted above.  Changes made to the above note as needed.  Patient seen and independently examined with Melinda Crutch, NP .   We discussed all aspects of the encounter. I agree with the assessment and plan as stated above.      SVT :   She has not had any recurrent episodes of SVT.  She has been off the diltiazem drip for about an hour.  The plan is to get DC her on Topprol XL 25 mg a day . Also will order propranolol 10 mg QID PRN breakthrough tachycardia   2.  Elevated troponin levels: She presented with elevated troponin levels.  They peaked at 119.  I suspect these were due to demand ischemia in the setting of having rapid SVT for 6 or 7 hours.  Just to be sure that she does not have significant coronary artery disease we will schedule her for a coronary CT angiogram.  We hope to get that scan today.  If it cannot be done today I think that we can still discharge her to home and schedule this as an outpatient.   3.  History of migraine headaches: She received Imitrex today.   4.  History of Hashimoto's thyroiditis: Continue Synthroid.   I have spent a total of 40 minutes with patient reviewing hospital  notes , telemetry, EKGs, labs and examining patient as well as establishing an assessment and plan that was discussed with the patient.  > 50% of time was spent in direct patient care.    [place] Headings, Brooke Bonito., MD, [facility] 03/17/2022, 4:18 PM [address],  [place] Pager [phone]

## 2022-03-16 NOTE — Care Management (Signed)
  Transition of Care [facility]) Screening Note   Patient Details  Name: Norah Devin Date of Birth: 1965-08-25   Transition of Care [facility]) CM/SW Contact:    Bethena Roys, RN Phone Number: 03/16/2022, 10:00 AM    Transition of Care Department [facility]) has reviewed the patient and no TOC needs have been identified at this time. Case Manager will continue to monitor patient advancement through interdisciplinary progression rounds. If new patient transition needs arise, please place a TOC consult.

## 2022-03-25 ENCOUNTER — Encounter: Payer: Self-pay | Admitting: Medical-Surgical

## 2022-03-25 ENCOUNTER — Telehealth (INDEPENDENT_AMBULATORY_CARE_PROVIDER_SITE_OTHER): Payer: BC Managed Care – PPO | Admitting: Medical-Surgical

## 2022-03-25 DIAGNOSIS — F419 Anxiety disorder, unspecified: Secondary | ICD-10-CM | POA: Diagnosis not present

## 2022-03-25 NOTE — Progress Notes (Signed)
Virtual Visit via Video Note  I connected with Kara Cooper on 03/25/22 at  1:00 PM EST by a video enabled telemedicine application and verified that I am speaking with the correct person using two identifiers.   I discussed the limitations of evaluation and management by telemedicine and the availability of in person appointments. The patient expressed understanding and agreed to proceed.  Patient location: home Provider locations: office  Subjective:    CC: Mood follow-up  HPI: Pleasant 56 year old female presenting today via [place] video visit for mood follow-up.  Approximately 6 weeks ago, we added fluoxetine to her medication list.  She is now taking Wellbutrin 300 mg daily, fluoxetine 20 mg daily, hydroxyzine 3 times daily as needed, and amitriptyline 25 mg nightly.  Tolerating all medications well without significant side effects  Notes that she takes the amitriptyline around 8:30 PM because if she takes it later she wakes up too groggy in the morning.  The hydroxyzine is helping with overall anxiety and does not cause excessive sleepiness.  Feels that her mood is now stable overall and she has seen a great improvement in her overall anxiety levels.  Is happy with her current regimen.  Denies SI/HI.  Past medical history, Surgical history, Family history not pertinant except as noted below, Social history, Allergies, and medications have been entered into the medical record, reviewed, and corrections made.   Review of Systems: See HPI for pertinent positives and negatives.   Objective:    General: Speaking clearly in complete sentences without any shortness of breath.  Alert and oriented x3.  Normal judgment. No apparent acute distress.  Impression and Recommendations:    1. Anxiety After several months of playing around with medications, we seem to have found a good regimen for her.  Continue Wellbutrin, fluoxetine, hydroxyzine, and amitriptyline as prescribed.  Monitor for  worsening of symptoms or any issues with the medications and let us know should that occur.  I discussed the assessment and treatment plan with the patient. The patient was provided an opportunity to ask questions and all were answered. The patient agreed with the plan and demonstrated an understanding of the instructions.   The patient was advised to call back or seek an in-person evaluation if the symptoms worsen or if the condition fails to improve as anticipated.  25 minutes of non-face-to-face time was provided during this encounter.  Return in about 6 months (around 09/24/2022) for mood follow up.  Clearnce Sorrel, DNP, APRN, FNP-BC [facility]

## 2022-03-28 ENCOUNTER — Ambulatory Visit (INDEPENDENT_AMBULATORY_CARE_PROVIDER_SITE_OTHER): Payer: BC Managed Care – PPO | Admitting: Professional

## 2022-03-28 ENCOUNTER — Encounter: Payer: Self-pay | Admitting: Professional

## 2022-03-28 DIAGNOSIS — F411 Generalized anxiety disorder: Secondary | ICD-10-CM

## 2022-03-28 NOTE — Progress Notes (Unsigned)
° ° ° ° ° ° ° ° ° ° ° ° ° ° °  Kara Cooper, LCMHC °
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
## 2022-03-29 DIAGNOSIS — F411 Generalized anxiety disorder: Secondary | ICD-10-CM | POA: Insufficient documentation

## 2022-03-29 NOTE — Telephone Encounter (Signed)
Updated in screening

## 2022-03-31 DIAGNOSIS — Z01419 Encounter for gynecological examination (general) (routine) without abnormal findings: Secondary | ICD-10-CM | POA: Diagnosis not present

## 2022-03-31 DIAGNOSIS — Z1231 Encounter for screening mammogram for malignant neoplasm of breast: Secondary | ICD-10-CM | POA: Diagnosis not present

## 2022-03-31 DIAGNOSIS — Z6827 Body mass index (BMI) 27.0-27.9, adult: Secondary | ICD-10-CM | POA: Diagnosis not present

## 2022-04-03 ENCOUNTER — Encounter: Payer: Self-pay | Admitting: Cardiovascular Disease

## 2022-04-03 NOTE — Progress Notes (Signed)
This encounter was created in error - please disregard.

## 2022-04-04 ENCOUNTER — Ambulatory Visit: Payer: BC Managed Care – PPO | Admitting: Cardiovascular Disease

## 2022-04-06 ENCOUNTER — Encounter: Payer: Self-pay | Admitting: Cardiovascular Disease

## 2022-04-06 NOTE — Progress Notes (Signed)
Cardiology Office Note:    Date:  04/08/2022   ID:  Kara Cooper, DOB Jul 05, 1965, MRN [id number]  PCP:  Samuel Bouche, NP   [facility] Cardiologist:  Mertie Moores, MD     Referring MD: Samuel Bouche, NP   Chief Complaint  Patient presents with   Tachycardia    History of Present Illness:    Kara Cooper is a 55 y.o. female with a hx of recent hopitalization for SVT and mildly + troponins  Coronary CTA on Nov. 29, 2023 reveals a CAC score of 0  Normal coronaries - no plaque  Echo - normal LVEF of 40-10%, normal diastolic function Minimal AV gradient measured.  No aortic stenosis    Dec. 21, 2023  Kara Cooper is seen for follow up of her SVT She had a prolonged episode of SVT lasting 6 or 7 hours and then presented to the hospital.  She ended up having elevated troponin levels following that 7-hour episode of SVT.  Peak troponin was 119. Echocardiogram reveals normal left ventricular systolic function.  She has no significant valvular disease. Coronary CTA reveals no obstructive coronary artery disease.  Coronary calcium score was 0.  Some rare ,very brief episodes of palpitations   Overall is doing great .   Past Medical History:  Diagnosis Date   Acute right pyelonephritis 04/04/2017   Anemia    in past   Basal cell carcinoma    treated with Moh's procedure   Chicken pox    in past   Headache(784.0)    frequent   History of seasonal allergies    Irregular menses    Migraine    Other and unspecified ovarian cysts    come and go   Postural orthostatic tachycardia syndrome    Dr. Burt Knack   UTI (lower urinary tract infection)    history of    Past Surgical History:  Procedure Laterality Date   CERVICAL CONIZATION W/BX  1987, 1988   DILATION AND CURETTAGE OF UTERUS      Current Medications: Current Meds  Medication Sig   acetaminophen (TYLENOL) 500 MG tablet Take OTC as directed   albuterol (VENTOLIN HFA) 108 (90 Base) MCG/ACT inhaler 2  PUFFS EVERY 6 HOURS AS NEEDED SOB   amitriptyline (ELAVIL) 25 MG tablet TAKE 1 TABLET BY MOUTH EVERYDAY AT BEDTIME   APPLE CIDER VINEGAR-GINGER PO Take 2 capsules by mouth in the morning and at bedtime.   buPROPion (WELLBUTRIN XL) 300 MG 24 hr tablet TAKE ONE TABLET BY MOUTH EVERY DAY   cetirizine (ZYRTEC) 10 MG tablet Take 10 mg by mouth daily.   estradiol (ESTRACE) 0.1 MG/GM vaginal cream Place vaginally.   FLUoxetine (PROZAC) 20 MG capsule Take 1 capsule (20 mg total) by mouth daily. START AFTER FINISHING 1 WEEK OF '10MG'$  DAILY.   fluticasone (FLONASE) 50 MCG/ACT nasal spray USE 2 SPRAYS IN EACH NOSTRIL DAILY AS NEEDED   hydrOXYzine (ATARAX) 50 MG tablet Take 0.5-1 tablets (25-50 mg total) by mouth at bedtime and may repeat dose one time if needed.   Ibuprofen 200 MG CAPS    levothyroxine (SYNTHROID) 88 MCG tablet Take 1 tablet (88 mcg total) by mouth daily before breakfast.   metoprolol succinate (TOPROL XL) 25 MG 24 hr tablet Take 1 tablet (25 mg total) by mouth daily.   Multiple Vitamin (MULTIVITAMIN) tablet Take 1 tablet by mouth daily.     omeprazole (PRILOSEC) 20 MG capsule Take 20 mg by mouth daily.   propranolol (  INDERAL) 10 MG tablet Take 1 tablet (10 mg total) by mouth 4 (four) times daily as needed (SVT).   SUMAtriptan (IMITREX) 50 MG tablet Take 1 tablet (50 mg total) by mouth daily as needed for migraine. Take one by mouth times one as needed for migraine- max one pill in a day.   valACYclovir (VALTREX) 500 MG tablet Take 1 tablet (500 mg total) by mouth daily.     Allergies:   Patient has no known allergies.   Social History   Socioeconomic History   Marital status: Single    Spouse name: Not on file   Number of children: 2   Years of education: Not on file   Highest education level: Not on file  Occupational History   Occupation: RN  Tobacco Use   Smoking status: Never   Smokeless tobacco: Never  Substance and Sexual Activity   Alcohol use: Yes    Comment:  Occasional   Drug use: No   Sexual activity: Not on file  Other Topics Concern   Not on file  Social History Narrative   Student.  Exercises every day, also cycles.   Social Determinants of Health   Financial Resource Strain: Not on file  Food Insecurity: Not on file  Transportation Needs: Not on file  Physical Activity: Not on file  Stress: Not on file  Social Connections: Not on file     Family History: The patient's family history includes Depression in her daughter; Diabetes in her daughter; Heart disease in her mother; Hyperlipidemia in her mother; Migraines in her daughter.  ROS:   Please see the history of present illness.     All other systems reviewed and are negative.  EKGs/Labs/Other Studies Reviewed:    The following studies were reviewed today: Hospital records   EKG:    Recent Labs: 11/23/2021: ALT 15 03/14/2022: TSH 3.188 03/15/2022: Magnesium 2.2 03/16/2022: BUN 11; Creatinine, Ser 0.88; Hemoglobin 13.0; Platelets 208; Potassium 4.0; Sodium 137  Recent Lipid Panel    Component Value Date/Time   CHOL 189 12/28/2020 0906   TRIG 113 12/28/2020 0906   HDL 46 (L) 12/28/2020 0906   CHOLHDL 4.1 12/28/2020 0906   VLDL 13.2 12/19/2012 1105   LDLCALC 121 (H) 12/28/2020 0906     Risk Assessment/Calculations:                Physical Exam:    VS:  BP 108/68   Pulse 69   Ht '5\' 2"'$  (1.575 m)   Wt 154 lb (69.9 kg)   LMP 05/03/2017 (Approximate)   SpO2 97%   BMI 28.17 kg/m     Wt Readings from Last 3 Encounters:  04/07/22 154 lb (69.9 kg)  03/14/22 148 lb (67.1 kg)  11/23/21 154 lb 12.8 oz (70.2 kg)     GEN:  Well nourished, well developed in no acute distress HEENT: Normal NECK: No JVD; No carotid bruits LYMPHATICS: No lymphadenopathy CARDIAC: RRR, soft outflow murmur  RESPIRATORY:  Clear to auscultation without rales, wheezing or rhonchi  ABDOMEN: Soft, non-tender, non-distended MUSCULOSKELETAL:  No edema; No deformity  SKIN: Warm and  dry NEUROLOGIC:  Alert and oriented x 3 PSYCHIATRIC:  Normal affect   ASSESSMENT:    1. SVT (supraventricular tachycardia)    PLAN:    In order of problems listed above:  Supraventricular tachycardia : Kara Cooper is doing well.  Continue current dose of Toprol-XL 25 mg a day.  She also has Inderal to take on his as needed  basis in case she has breakthrough SVT. Continue to exercise.  We discussed ablation briefly.  I do not think that she needs ablation as long she is well-controlled.  Will continue to follow yearly.   2.  + troponins :   Patient had elevated bated troponin levels after having SVT for 7 hours.  Coronary CT angiogram revealed no evidence of coronary artery disease.  I suspect that the troponin elevation was due to demand ischemia with her profound and prolonged tachycardia.          Medication Adjustments/Labs and Tests Ordered: Current medicines are reviewed at length with the patient today.  Concerns regarding medicines are outlined above.  No orders of the defined types were placed in this encounter.  No orders of the defined types were placed in this encounter.   Patient Instructions  Medication Instructions:  Your physician recommends that you continue on your current medications as directed. Please refer to the Current Medication list given to you today.  *If you need a refill on your cardiac medications before your next appointment, please call your pharmacy*   Lab Work: NONE If you have labs (blood work) drawn today and your tests are completely normal, you will receive your results only by: [place] (if you have MyChart) OR A paper copy in the mail If you have any lab test that is abnormal or we need to change your treatment, we will call you to review the results.   Testing/Procedures: NONE   Follow-Up: At [facility], you and your health needs are our priority.  As part of our continuing mission to provide you with  exceptional heart care, we have created designated Provider Care Teams.  These Care Teams include your primary Cardiologist (physician) and Advanced Practice Providers (APPs -  Physician Assistants and Nurse Practitioners) who all work together to provide you with the care you need, when you need it.  We recommend signing up for the patient portal called "MyChart".  Sign up information is provided on this After Visit Summary.  MyChart is used to connect with patients for Virtual Visits (Telemedicine).  Patients are able to view lab/test results, encounter notes, upcoming appointments, etc.  Non-urgent messages can be sent to your provider as well.   To learn more about what you can do with MyChart, go to [email or web address].    Your next appointment:   1 year(s)  The format for your next appointment:   In Person  Provider:   Mertie Moores, MD       Important Information About Sugar         Signed, Mertie Moores, MD  04/08/2022 6:44 AM    [place]

## 2022-04-07 ENCOUNTER — Ambulatory Visit: Payer: BC Managed Care – PPO | Attending: Cardiovascular Disease | Admitting: Cardiovascular Disease

## 2022-04-07 ENCOUNTER — Encounter: Payer: Self-pay | Admitting: Cardiovascular Disease

## 2022-04-07 VITALS — BP 108/68 | HR 69 | Ht 62.0 in | Wt 154.0 lb

## 2022-04-07 DIAGNOSIS — I471 Supraventricular tachycardia, unspecified: Secondary | ICD-10-CM | POA: Diagnosis not present

## 2022-04-07 NOTE — Patient Instructions (Signed)
Medication Instructions:  Your physician recommends that you continue on your current medications as directed. Please refer to the Current Medication list given to you today.  *If you need a refill on your cardiac medications before your next appointment, please call your pharmacy*   Lab Work: NONE If you have labs (blood work) drawn today and your tests are completely normal, you will receive your results only by: MyChart Message (if you have MyChart) OR A paper copy in the mail If you have any lab test that is abnormal or we need to change your treatment, we will call you to review the results.   Testing/Procedures: NONE   Follow-Up: At  [facility], you and your health needs are our priority.  As part of our continuing mission to provide you with exceptional heart care, we have created designated Provider Care Teams.  These Care Teams include your primary Cardiologist (physician) and Advanced Practice Providers (APPs -  Physician Assistants and Nurse Practitioners) who all work together to provide you with the care you need, when you need it.  We recommend signing up for the patient portal called "MyChart".  Sign up information is provided on this After Visit Summary.  MyChart is used to connect with patients for Virtual Visits (Telemedicine).  Patients are able to view lab/test results, encounter notes, upcoming appointments, etc.  Non-urgent messages can be sent to your provider as well.   To learn more about what you can do with MyChart, go to [email or web address].    Your next appointment:   1 year(s)  The format for your next appointment:   In Person  Provider:   Philip Nahser, MD       Important Information About Sugar       

## 2022-04-13 ENCOUNTER — Encounter: Payer: Self-pay | Admitting: Cardiovascular Disease

## 2022-04-13 MED ORDER — METOPROLOL SUCCINATE ER 25 MG PO TB24: 25.0000 mg | ORAL_TABLET | Freq: Every day | ORAL | 1 refills | Status: DC

## 2022-04-29 ENCOUNTER — Ambulatory Visit (INDEPENDENT_AMBULATORY_CARE_PROVIDER_SITE_OTHER): Payer: BC Managed Care – PPO | Admitting: Professional

## 2022-04-29 ENCOUNTER — Encounter: Payer: Self-pay | Admitting: Professional

## 2022-04-29 DIAGNOSIS — F411 Generalized anxiety disorder: Secondary | ICD-10-CM

## 2022-04-29 DIAGNOSIS — F4310 Post-traumatic stress disorder, unspecified: Secondary | ICD-10-CM
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
## 2022-04-29 NOTE — Progress Notes (Signed)
                Paige Monarrez, LCMHC °

## 2022-05-05 ENCOUNTER — Ambulatory Visit: Payer: BC Managed Care – PPO | Admitting: Cardiovascular Disease

## 2022-05-09 ENCOUNTER — Ambulatory Visit (INDEPENDENT_AMBULATORY_CARE_PROVIDER_SITE_OTHER): Payer: BC Managed Care – PPO | Admitting: Professional

## 2022-05-09 ENCOUNTER — Encounter: Payer: Self-pay | Admitting: Professional

## 2022-05-09 DIAGNOSIS — F411 Generalized anxiety disorder: Secondary | ICD-10-CM

## 2022-05-09 DIAGNOSIS — F4310 Post-traumatic stress disorder, unspecified: Secondary | ICD-10-CM | POA: Diagnosis not present

## 2022-05-09 NOTE — Progress Notes (Addendum)
[facility] Counselor/Therapist Progress Note  Patient ID: Kara Cooper, MRN: [id number],    Date: 05/12/2022  Time Spent: 44 minutes [id number]   Treatment Type: Individual Therapy  Risk Assessment: Danger to Self:  No Self-injurious Behavior: No Danger to Others: No  Subjective: This session was held via video teletherapy. The patient consented to video teletherapy and was located in her home during this session. She is aware it is the responsibility of the patient to secure confidentiality on her end of the session. The provider was in a private home office for the duration of this session.    The patient arrived on time for her webex session.  Issues addressed: 1-anxiety a-anxiety began around the time she was turning 32 -she would begin feeling overwhelmed -started using a calendar to try and keep her on task b-she would get caught in her perceptions and continues to do so -pt provided end-of-life metrics -she failed to document checking on a patient and "we failed a metric" -her leader contacted her and corrected her lack of documentation -she felt chastised by how her boss communicated   -she did not like tone and volume -discussed how she could address with boss -pt admits that her telling her own story does get her thinking it was going to be bad and it is not as difficult -pt feels insufficient sometimes in that situation   -pt thinks improved communication would be helpful -the culture is not finding out the why but jumping to the conclusion -pt shuts down when someone takes an authoritarian posture 2-history of having to defend herself professionally and personally   -example of being at [place] [facility] and another pt was talking over receptionist and pt had to interrupt and excuse herself for being in the way -pt remembers wanting to be seen and heard and she wanted her attention so back the pt sat on her lap and peed on her -pt doesn't know that she has  deliberately chose to get in trouble   -pt wanted so much to be appreciated -too many times to count -she reports she had to raise herself and figure things out on her own and that was her protection -her mom was emotionally absent so overachieved to show she could do it 3-negative thinking -educated -strategies -examples   Treatment Plan Problems Addressed  Anxiety, Childhood Trauma, Grief / Loss Unresolved, Low Self-Esteem  Goals 1. Begin a healthy grieving process around the loss. Objective Verbalize resolution of feelings of guilt and regret associated with the loss. Target Date: 2023-04-29 Frequency: Biweekly  Progress: 0 Modality: individual  Related Interventions Assign the client to make a list of all the regrets associated with actions toward or relationship with the deceased; process the list content toward resolution of these feelings. Objective Decrease unrealistic thoughts, statements, and feelings of being responsible for the loss. Target Date: 2023-04-29 Frequency: Biweekly  Progress: 0 Modality: individual  Related Interventions Use a cognitive therapy approach to identify the client's bias toward thoughts of personal responsibility for the loss and replace them with factual, reality-based thoughts (or assign "Negative Thoughts Trigger Negative Feelings" in the Adult Psychotherapy Homework Planner by Bryn Gulling). Objective Express thoughts and feelings about the deceased that went unexpressed while the deceased was alive. Target Date: 2023-04-29 Frequency: Biweekly  Progress: 0 Modality: individual  Related Interventions Conduct an empty-chair exercise with the client where he/she focuses on expressing to the lost loved one imagined in the chair what he/she never said while that loved  one was alive. Assign the client to visit the grave of the lost loved one to "talk to" the deceased and express his/her feelings. Ask the client to write a letter to the lost person  describing his/her fond memories and/or painful and regretful memories, and how he/she currently feels life (or assign "Dear : A Letter to a Lost Loved One" in the Adult Psychotherapy Homework Planner by [facility]); process the letter in session. Objective Report decreased time spent each day focusing on the loss. Target Date: 2023-04-29 Frequency: Biweekly  Progress: 0 Modality: individual  Related Interventions Suggest that the client set aside a specific time-limited period each day to focus on mourning his/her loss. After each day's time is up, the client will resume regular activities and postpone grieving thoughts until the next scheduled time. For example, mourning times could include putting on dark clothing and/or sad music; clothing would be changed when the allotted time is up. Objective Tell in detail the story of the current loss that is triggering symptoms. Target Date: 2023-04-29 Frequency: Biweekly  Progress: 0 Modality: individual  Related Interventions Use empathy, compassion, and support, allowing the client to tell in detail the story of his/her recent loss. Ask the client to elaborate in an autobiography the circumstances, feelings, and effects of the losses in him/her; assess the characteristics of the loss (e.g., type, suddenness, trauma), previous functioning, current functioning, and coping style. Objective Identify what stages of grief have been experienced in the continuum of the grieving process. Target Date: 2023-04-29 Frequency: Biweekly  Progress: 0 Modality: individual  Related Interventions Educate the client on the stages of the grieving process and answer any questions he/she may have. Assist the client in identifying the stages of grief that he/she has experienced and which stage he/she is presently working through. 2. Demonstrate improved self-esteem through more pride in appearance, more assertiveness, greater eye contact, and identification of positive traits  in self-talk messages. 3. Develop a consistent, positive self-image. 4. Develop an awareness of how childhood issues have affected and continue to affect one's family life. Objective Describe what it was like to grow up in the home environment. Target Date: 2023-04-29 Frequency: Biweekly  Progress: 0 Modality: individual  Related Interventions Develop the client's family genogram and/or symptom line and help identify patterns of dysfunction within the family. Objective Describe each family member and identify the role each played within the family. Target Date: 2023-04-29 Frequency: Biweekly  Progress: 0 Modality: individual  Related Interventions Assist the client in clarifying his/her role within the family and his/her feelings connected to that role. Objective Identify how own parenting has been influenced by childhood experiences. Target Date: 2023-04-29 Frequency: Biweekly  Progress: 0 Modality: individual  Related Interventions Ask the client to compare his/her parenting behavior to that of parent figures of his/her childhood; encourage the client to be aware of how easily we repeat patterns that we grew up with. Objective Decrease feelings of shame by being able to verbally affirm self as not responsible for abuse. Target Date: 2023-04-29 Frequency: Biweekly  Progress: 0 Modality: individual  Related Interventions Assign writing a letter to mother, father, or other abuser in which the client expresses his/her feelings regarding the abuse. Guide the client in an empty chair exercise with a key figure connected to the abuse (i.e., perpetrator, sibling, or parent); reinforce the client for placing responsibility for the abuse or neglect on the caretaker. Consistently reiterate that responsibility for the abuse falls on the abusive adults, not the surviving child (for  deserving the abuse), and reinforce statements that accurately reflect placing blame on perpetrators and on  nonprotective, nonnurturant adults. Objective Decrease statements of being a victim while increasing statements that reflect personal empowerment. Target Date: 2023-04-29 Frequency: Biweekly  Progress: 0 Modality: individual  Related Interventions Ask the client to complete an exercise that identifies the positives and negatives of being a victim and the positives and negatives of being a survivor; compare and process the lists. Encourage and reinforce the client's statements that reflect movement away from viewing self as a victim and toward personal empowerment as a survivor (or assign "Changing from Victim to Survivor" in the Adult Psychotherapy Homework Planner by Bryn Gulling). Objective Identify patterns of abuse, neglect, or abandonment within the family of origin, both current and historical, nuclear and extended. Target Date: 2023-04-29 Frequency: Biweekly  Progress: 0 Modality: individual  Related Interventions Explore the client's painful childhood experiences (or assign "Share the Painful Memory" in the Adult Psychotherapy Homework Planner by Bryn Gulling). 5. Elevate self-esteem. Objective Acknowledge feeling less competent than most others. Target Date: 2023-04-29 Frequency: Biweekly  Progress: 0 Modality: individual  Related Interventions Explore the client's assessment of himself/herself and what is verbalized as the basis for negative self-perception. Objective Increase insight into the historical and current sources of low self-esteem. Target Date: 2023-04-29 Frequency: Biweekly  Progress: 0 Modality: individual  Related Interventions Discuss, emphasize, and interpret the client's incidents of abuse (emotional, physical, and sexual) and how they have impacted his/her feelings about himself/herself. Help the client become aware of his/her fear of rejection and its connection with past rejection or abandonment experiences; begin to contrast past experiences of pain with present  experiences of acceptance and competence. Objective Decrease the frequency of negative self-descriptive statements and increase frequency of positive self-descriptive statements. Target Date: 2023-04-29 Frequency: Biweekly  Progress: 0 Modality: individual  Related Interventions Assist the client in becoming aware of how he/she expresses or acts out negative feelings about himself/herself. Help the client reframe his/her negative assessment of himself/herself. Assist the client in developing positive self-talk as a way of boosting his/her confidence and self-image (or assign "Positive Self-Talk" in the Adult Psychotherapy Homework Planner by Bryn Gulling). Objective Identify and replace negative self-talk messages used to reinforce low self-esteem. Target Date: 2023-04-29 Frequency: Biweekly  Progress: 0 Modality: individual  Related Interventions Help the client identify his/her distorted, negative beliefs about self and the world and replace these messages with more realistic, affirmative messages (or assign "Journal and Replace Self-Defeating Thoughts" in the Adult Psychotherapy Homework Planner by [facility] or read What to Say When You Talk to Yourself by [employer]). Objective Identify and engage in activities that would improve self-image by being consistent with one's values. Target Date: 2023-04-29 Frequency: Biweekly  Progress: 0 Modality: individual  Related Interventions Help the client analyze his/her values and the congruence or incongruence between them and the client's daily activities. Identify and assign activities congruent with the client's values; process them toward improving self-concept and self-esteem. Objective Take verbal responsibility for accomplishments without discounting. Target Date: 2023-04-29 Frequency: Biweekly  Progress: 0 Modality: individual  Related Interventions Ask the client to list accomplishments; process the integration of these into his/her  self-image. Objective Identify any secondary gain that is received by speaking negatively about self and refusing to take any risks. Target Date: 2023-04-29 Frequency: Biweekly  Progress: 0 Modality: individual  Related Interventions Teach the client the meaning and power of secondary gain in maintaining negative behavior patterns. Assist the client in identifying how self-disparagement and avoidance  of risk-taking could bring secondary gain (e.g., praise from others, others taking over responsibilities). 6. Enhance ability to effectively cope with the full variety of life's worries and anxieties. 7. Establish an inward sense of self-worth, confidence, and competence. 8. Learn and implement coping skills that result in a reduction of anxiety and worry, and improved daily functioning. 9. Let go of blame and begin to forgive others for pain caused in childhood. 10. Reduce overall frequency, intensity, and duration of the anxiety so that daily functioning is not impaired. Objective Describe situations, thoughts, feelings, and actions associated with anxieties and worries, their impact on functioning, and attempts to resolve them. Target Date: 2023-04-29 Frequency: Biweekly  Progress: 0 Modality: individual  Related Interventions Ask the client to describe his/her past experiences of anxiety and their impact on functioning; assess the focus, excessiveness, and uncontrollability of the worry and the type, frequency, intensity, and duration of his/her anxiety symptoms (consider using a structured interview such as The Anxiety Disorders Interview Schedule-Adult Version). Objective Verbalize an understanding of the cognitive, physiological, and behavioral components of anxiety and its treatment. Target Date: 2023-04-29 Frequency: Biweekly  Progress: 0 Modality: individual  Related Interventions Discuss how generalized anxiety typically involves excessive worry about unrealistic threats, various  bodily expressions of tension, overarousal, and hypervigilance, and avoidance of what is threatening that interact to maintain the problem (see Mastery of Your Anxiety and Worry: Therapist Guide by Corky Mull, and Barlow; Treating Generalized Anxiety Disorder by Rygh and Amparo Bristol). Objective Learn and implement calming skills to reduce overall anxiety and manage anxiety symptoms. Target Date: 2023-04-29 Frequency: Biweekly  Progress: 0 Modality: individual  Related Interventions Teach the client calming/relaxation skills (e.g., applied relaxation, progressive muscle relaxation, cue controlled relaxation; mindful breathing; biofeedback) and how to discriminate better between relaxation and tension; teach the client how to apply these skills to his/her daily life (e.g., New Directions in Progressive Muscle Relaxation by Casper Harrison, and Hazlett-Stevens; Treating Generalized Anxiety Disorder by Rygh and Amparo Bristol). Assign the client homework each session in which he/she practices relaxation exercises daily, gradually applying them progressively from non-anxiety-provoking to anxiety-provoking situations; review and reinforce success while providing corrective feedback toward improvement. Assign the client to read about progressive muscle relaxation and other calming strategies in relevant books or treatment manuals (e.g., Progressive Relaxation Training by Gwynneth Aliment and Dani Gobble; Mastery of Your Anxiety and Worry: Workbook by Beckie Busing). Objective Learn and implement a strategy to limit the association between various environmental settings and worry, delaying the worry until a designated "worry time." Target Date: 2023-04-29 Frequency: Biweekly  Progress: 0 Modality: individual  Related Interventions Explain the rationale for using a worry time as well as how it is to be used; agree upon and implement a worry time with the client. Teach the client how to recognize, stop, and  postpone worry to the agreed upon worry time using skills such as thought stopping, relaxation, and redirecting attention (or assign "Making Use of the Thought-Stopping Technique" and/or "Worry Time" in the Adult Psychotherapy Homework Planner by Jongsma to assist skill development); encourage use in daily life; review and reinforce success while providing corrective feedback toward improvement. Objective Verbalize an understanding of the role that cognitive biases play in excessive irrational worry and persistent anxiety symptoms. Target Date: 2023-04-29 Frequency: Biweekly  Progress: 0 Modality: individual  Related Interventions Assist the client in analyzing his/her worries by examining potential biases such as the probability of the negative expectation occurring, the real consequences of it occurring, his/her ability to  control the outcome, the worst possible outcome, and his/her ability to accept it (see "Analyze the Probability of a Feared Event" in the Adult Psychotherapy Homework Planner by Bryn Gulling; Cognitive Therapy of Anxiety Disorders by Alison Stalling). Objective Identify, challenge, and replace biased, fearful self-talk with positive, realistic, and empowering self-talk. Target Date: 2023-04-29 Frequency: Biweekly  Progress: 0 Modality: individual  Related Interventions Explore the client's schema and self-talk that mediate his/her fear response; assist him/her in challenging the biases; replace the distorted messages with reality-based alternatives and positive, realistic self-talk that will increase his/her self-confidence in coping with irrational fears (see Cognitive Therapy of Anxiety Disorders by Alison Stalling). Assign the client a homework exercise in which he/she identifies fearful self-talk, identifies biases in the self-talk, generates alternatives, and tests through behavioral experiments (or assign "Negative Thoughts Trigger Negative Feelings" in the Adult Psychotherapy  Homework Planner by [facility]); review and reinforce success, providing corrective feedback toward improvement. Objective Learn and implement personal and interpersonal skills to reduce anxiety and improve interpersonal relationships. Target Date: 2023-04-29 Frequency: Biweekly  Progress: 0 Modality: individual  Related Interventions Use instruction, modeling, and role-playing to build the client's general social, communication, and/or conflict resolution skills. Objective Learn to accept limitations in life and commit to tolerating, rather than avoiding, unpleasant emotions while accomplishing meaningful goals. Target Date: 2023-04-29 Frequency: Biweekly  Progress: 0 Modality: individual  Related Interventions Use techniques from Acceptance and Commitment Therapy to help client accept uncomfortable realities such as lack of complete control, imperfections, and uncertainty and tolerate unpleasant emotions and thoughts in order to accomplish value-consistent goals. 11. Release the emotions associated with past childhood/family issues, resulting in less resentment and more serenity. 12. Resolve past childhood/family issues, leading to less anger and depression, greater self-esteem, security, and confidence. 13. Resolve the core conflict that is the source of anxiety. 14. Stabilize anxiety level while increasing ability to function on a daily basis.  Diagnosis:Generalized anxiety disorder  PTSD (post-traumatic stress disorder)  Plan:  -read and complete handout "Negative Thoughts Trigger Negative Feelings" -meet again on Monday, May 23, 2022 at 4pm.

## 2022-05-23 ENCOUNTER — Encounter: Payer: Self-pay | Admitting: Professional

## 2022-05-23 ENCOUNTER — Ambulatory Visit (INDEPENDENT_AMBULATORY_CARE_PROVIDER_SITE_OTHER): Payer: BC Managed Care – PPO | Admitting: Professional

## 2022-05-23 DIAGNOSIS — F4310 Post-traumatic stress disorder, unspecified: Secondary | ICD-10-CM

## 2022-05-23 DIAGNOSIS — F411 Generalized anxiety disorder: Secondary | ICD-10-CM

## 2022-05-23 NOTE — Progress Notes (Signed)
[facility] Counselor/Therapist Progress Note  Patient ID: Kara Cooper, MRN: [id number],    Date: 05/23/2022  Time Spent: 58 minutes [id number]   Treatment Type: Individual Therapy  Risk Assessment: Danger to Self:  No Self-injurious Behavior: No Danger to Others: No  Subjective: This session was held via video teletherapy. The patient consented to video teletherapy and was located in her home during this session. She is aware it is the responsibility of the patient to secure confidentiality on her end of the session. The provider was in a private home office for the duration of this session.    The patient arrived on time for her webex session.  Issues addressed: 1-homework -read and complete handout "Negative Thoughts Trigger Negative Feelings" 2-struggled a-alcohol "thing" continued to resonant with me b-working out turned into positive of working hard c-pt noticed a shift with what happened around her -gets interrupted and feels paralyzed with how to get her off track -call from a med tech about a medication messed up my flow -when she gets thrown off she struggles and feels bad about herself for not meeting goals -how to teach people what she needs      anxiety a-anxiety began around the time she was turning 32 -she would begin feeling overwhelmed -started using a calendar to try and keep her on task b-she would get caught in her perceptions and continues to do so -pt provided end-of-life metrics -she failed to document checking on a patient and "we failed a metric" -her leader contacted her and corrected her lack of documentation -she felt chastised by how her boss communicated   -she did not like tone and volume -discussed how she could address with boss -pt admits that her telling her own story does get her thinking it was going to be bad and it is not as difficult -pt feels insufficient sometimes in that situation   -pt thinks improved  communication would be helpful -the culture is not finding out the why but jumping to the conclusion -pt shuts down when someone takes an authoritarian posture 2-history of having to defend herself professionally and personally   -example of being [facility] and another pt was talking over receptionist and pt had to interrupt and excuse herself for being in the way -pt remembers wanting to be seen and heard and she wanted her attention so back the pt sat on her lap and peed on her -pt doesn't know that she has deliberately chose to get in trouble   -pt wanted so much to be appreciated -too many times to count -she reports she had to raise herself and figure things out on her own and that was her protection -her mom was emotionally absent so overachieved to show she could do it 3-negative thinking -educated -strategies -examples   Treatment Plan Problems Addressed  Anxiety, Childhood Trauma, Grief / Loss Unresolved, Low Self-Esteem  Goals 1. Begin a healthy grieving process around the loss. Objective Verbalize resolution of feelings of guilt and regret associated with the loss. Target Date: 2023-04-29 Frequency: Biweekly  Progress: 0 Modality: individual  Related Interventions Assign the client to make a list of all the regrets associated with actions toward or relationship with the deceased; process the list content toward resolution of these feelings. Objective Decrease unrealistic thoughts, statements, and feelings of being responsible for the loss. Target Date: 2023-04-29 Frequency: Biweekly  Progress: 0 Modality: individual  Related Interventions Use a cognitive therapy approach to identify the client's bias  toward thoughts of personal responsibility for the loss and replace them with factual, reality-based thoughts (or assign "Negative Thoughts Trigger Negative Feelings" in the Adult Psychotherapy Homework Planner by Bryn Gulling). Objective Express thoughts and feelings about  the deceased that went unexpressed while the deceased was alive. Target Date: 2023-04-29 Frequency: Biweekly  Progress: 0 Modality: individual  Related Interventions Conduct an empty-chair exercise with the client where he/she focuses on expressing to the lost loved one imagined in the chair what he/she never said while that loved one was alive. Assign the client to visit the grave of the lost loved one to "talk to" the deceased and express his/her feelings. Ask the client to write a letter to the lost person describing his/her fond memories and/or painful and regretful memories, and how he/she currently feels life (or assign "Dear : A Letter to a Lost Loved One" in the Adult Psychotherapy Homework Planner by [facility]); process the letter in session. Objective Report decreased time spent each day focusing on the loss. Target Date: 2023-04-29 Frequency: Biweekly  Progress: 0 Modality: individual  Related Interventions Suggest that the client set aside a specific time-limited period each day to focus on mourning his/her loss. After each day's time is up, the client will resume regular activities and postpone grieving thoughts until the next scheduled time. For example, mourning times could include putting on dark clothing and/or sad music; clothing would be changed when the allotted time is up. Objective Tell in detail the story of the current loss that is triggering symptoms. Target Date: 2023-04-29 Frequency: Biweekly  Progress: 0 Modality: individual  Related Interventions Use empathy, compassion, and support, allowing the client to tell in detail the story of his/her recent loss. Ask the client to elaborate in an autobiography the circumstances, feelings, and effects of the losses in him/her; assess the characteristics of the loss (e.g., type, suddenness, trauma), previous functioning, current functioning, and coping style. Objective Identify what stages of grief have been experienced in the  continuum of the grieving process. Target Date: 2023-04-29 Frequency: Biweekly  Progress: 0 Modality: individual  Related Interventions Educate the client on the stages of the grieving process and answer any questions he/she may have. Assist the client in identifying the stages of grief that he/she has experienced and which stage he/she is presently working through. 2. Demonstrate improved self-esteem through more pride in appearance, more assertiveness, greater eye contact, and identification of positive traits in self-talk messages. 3. Develop a consistent, positive self-image. 4. Develop an awareness of how childhood issues have affected and continue to affect one's family life. Objective Describe what it was like to grow up in the home environment. Target Date: 2023-04-29 Frequency: Biweekly  Progress: 0 Modality: individual  Related Interventions Develop the client's family genogram and/or symptom line and help identify patterns of dysfunction within the family. Objective Describe each family member and identify the role each played within the family. Target Date: 2023-04-29 Frequency: Biweekly  Progress: 0 Modality: individual  Related Interventions Assist the client in clarifying his/her role within the family and his/her feelings connected to that role. Objective Identify how own parenting has been influenced by childhood experiences. Target Date: 2023-04-29 Frequency: Biweekly  Progress: 0 Modality: individual  Related Interventions Ask the client to compare his/her parenting behavior to that of parent figures of his/her childhood; encourage the client to be aware of how easily we repeat patterns that we grew up with. Objective Decrease feelings of shame by being able to verbally affirm self as not responsible  for abuse. Target Date: 2023-04-29 Frequency: Biweekly  Progress: 0 Modality: individual  Related Interventions Assign writing a letter to mother, father, or other  abuser in which the client expresses his/her feelings regarding the abuse. Guide the client in an empty chair exercise with a key figure connected to the abuse (i.e., perpetrator, sibling, or parent); reinforce the client for placing responsibility for the abuse or neglect on the caretaker. Consistently reiterate that responsibility for the abuse falls on the abusive adults, not the surviving child (for deserving the abuse), and reinforce statements that accurately reflect placing blame on perpetrators and on nonprotective, nonnurturant adults. Objective Decrease statements of being a victim while increasing statements that reflect personal empowerment. Target Date: 2023-04-29 Frequency: Biweekly  Progress: 0 Modality: individual  Related Interventions Ask the client to complete an exercise that identifies the positives and negatives of being a victim and the positives and negatives of being a survivor; compare and process the lists. Encourage and reinforce the client's statements that reflect movement away from viewing self as a victim and toward personal empowerment as a survivor (or assign "Changing from Victim to Survivor" in the Adult Psychotherapy Homework Planner by Bryn Gulling). Objective Identify patterns of abuse, neglect, or abandonment within the family of origin, both current and historical, nuclear and extended. Target Date: 2023-04-29 Frequency: Biweekly  Progress: 0 Modality: individual  Related Interventions Explore the client's painful childhood experiences (or assign "Share the Painful Memory" in the Adult Psychotherapy Homework Planner by Bryn Gulling). 5. Elevate self-esteem. Objective Acknowledge feeling less competent than most others. Target Date: 2023-04-29 Frequency: Biweekly  Progress: 0 Modality: individual  Related Interventions Explore the client's assessment of himself/herself and what is verbalized as the basis for negative self-perception. Objective Increase insight  into the historical and current sources of low self-esteem. Target Date: 2023-04-29 Frequency: Biweekly  Progress: 0 Modality: individual  Related Interventions Discuss, emphasize, and interpret the client's incidents of abuse (emotional, physical, and sexual) and how they have impacted his/her feelings about himself/herself. Help the client become aware of his/her fear of rejection and its connection with past rejection or abandonment experiences; begin to contrast past experiences of pain with present experiences of acceptance and competence. Objective Decrease the frequency of negative self-descriptive statements and increase frequency of positive self-descriptive statements. Target Date: 2023-04-29 Frequency: Biweekly  Progress: 0 Modality: individual  Related Interventions Assist the client in becoming aware of how he/she expresses or acts out negative feelings about himself/herself. Help the client reframe his/her negative assessment of himself/herself. Assist the client in developing positive self-talk as a way of boosting his/her confidence and self-image (or assign "Positive Self-Talk" in the Adult Psychotherapy Homework Planner by Bryn Gulling). Objective Identify and replace negative self-talk messages used to reinforce low self-esteem. Target Date: 2023-04-29 Frequency: Biweekly  Progress: 0 Modality: individual  Related Interventions Help the client identify his/her distorted, negative beliefs about self and the world and replace these messages with more realistic, affirmative messages (or assign "Journal and Replace Self-Defeating Thoughts" in the Adult Psychotherapy Homework Planner by [facility] or read What to Say When You Talk to Yourself by Helmstetter). Objective Identify and engage in activities that would improve self-image by being consistent with one's values. Target Date: 2023-04-29 Frequency: Biweekly  Progress: 0 Modality: individual  Related Interventions Help the client  analyze his/her values and the congruence or incongruence between them and the client's daily activities. Identify and assign activities congruent with the client's values; process them toward improving self-concept and self-esteem. Objective Take verbal responsibility  for accomplishments without discounting. Target Date: 2023-04-29 Frequency: Biweekly  Progress: 0 Modality: individual  Related Interventions Ask the client to list accomplishments; process the integration of these into his/her self-image. Objective Identify any secondary gain that is received by speaking negatively about self and refusing to take any risks. Target Date: 2023-04-29 Frequency: Biweekly  Progress: 0 Modality: individual  Related Interventions Teach the client the meaning and power of secondary gain in maintaining negative behavior patterns. Assist the client in identifying how self-disparagement and avoidance of risk-taking could bring secondary gain (e.g., praise from others, others taking over responsibilities). 6. Enhance ability to effectively cope with the full variety of life's worries and anxieties. 7. Establish an inward sense of self-worth, confidence, and competence. 8. Learn and implement coping skills that result in a reduction of anxiety and worry, and improved daily functioning. 9. Let go of blame and begin to forgive others for pain caused in childhood. 10. Reduce overall frequency, intensity, and duration of the anxiety so that daily functioning is not impaired. Objective Describe situations, thoughts, feelings, and actions associated with anxieties and worries, their impact on functioning, and attempts to resolve them. Target Date: 2023-04-29 Frequency: Biweekly  Progress: 0 Modality: individual  Related Interventions Ask the client to describe his/her past experiences of anxiety and their impact on functioning; assess the focus, excessiveness, and uncontrollability of the worry and the type,  frequency, intensity, and duration of his/her anxiety symptoms (consider using a structured interview such as The Anxiety Disorders Interview Schedule-Adult Version). Objective Verbalize an understanding of the cognitive, physiological, and behavioral components of anxiety and its treatment. Target Date: 2023-04-29 Frequency: Biweekly  Progress: 0 Modality: individual  Related Interventions Discuss how generalized anxiety typically involves excessive worry about unrealistic threats, various bodily expressions of tension, overarousal, and hypervigilance, and avoidance of what is threatening that interact to maintain the problem (see Mastery of Your Anxiety and Worry: Therapist Guide by Corky Mull, and Barlow; Treating Generalized Anxiety Disorder by Rygh and Amparo Bristol). Objective Learn and implement calming skills to reduce overall anxiety and manage anxiety symptoms. Target Date: 2023-04-29 Frequency: Biweekly  Progress: 0 Modality: individual  Related Interventions Teach the client calming/relaxation skills (e.g., applied relaxation, progressive muscle relaxation, cue controlled relaxation; mindful breathing; biofeedback) and how to discriminate better between relaxation and tension; teach the client how to apply these skills to his/her daily life (e.g., New Directions in Progressive Muscle Relaxation by Casper Harrison, and Hazlett-Stevens; Treating Generalized Anxiety Disorder by Rygh and Amparo Bristol). Assign the client homework each session in which he/she practices relaxation exercises daily, gradually applying them progressively from non-anxiety-provoking to anxiety-provoking situations; review and reinforce success while providing corrective feedback toward improvement. Assign the client to read about progressive muscle relaxation and other calming strategies in relevant books or treatment manuals (e.g., Progressive Relaxation Training by Gwynneth Aliment and Dani Gobble; Mastery of Your Anxiety  and Worry: Workbook by Beckie Busing). Objective Learn and implement a strategy to limit the association between various environmental settings and worry, delaying the worry until a designated "worry time." Target Date: 2023-04-29 Frequency: Biweekly  Progress: 0 Modality: individual  Related Interventions Explain the rationale for using a worry time as well as how it is to be used; agree upon and implement a worry time with the client. Teach the client how to recognize, stop, and postpone worry to the agreed upon worry time using skills such as thought stopping, relaxation, and redirecting attention (or assign "Making Use of the Thought-Stopping Technique" and/or "Worry Time" in  the Adult Psychotherapy Homework Planner by Bryn Gulling to assist skill development); encourage use in daily life; review and reinforce success while providing corrective feedback toward improvement. Objective Verbalize an understanding of the role that cognitive biases play in excessive irrational worry and persistent anxiety symptoms. Target Date: 2023-04-29 Frequency: Biweekly  Progress: 0 Modality: individual  Related Interventions Assist the client in analyzing his/her worries by examining potential biases such as the probability of the negative expectation occurring, the real consequences of it occurring, his/her ability to control the outcome, the worst possible outcome, and his/her ability to accept it (see "Analyze the Probability of a Feared Event" in the Adult Psychotherapy Homework Planner by Bryn Gulling; Cognitive Therapy of Anxiety Disorders by Alison Stalling). Objective Identify, challenge, and replace biased, fearful self-talk with positive, realistic, and empowering self-talk. Target Date: 2023-04-29 Frequency: Biweekly  Progress: 0 Modality: individual  Related Interventions Explore the client's schema and self-talk that mediate his/her fear response; assist him/her in challenging the biases; replace the  distorted messages with reality-based alternatives and positive, realistic self-talk that will increase his/her self-confidence in coping with irrational fears (see Cognitive Therapy of Anxiety Disorders by Alison Stalling). Assign the client a homework exercise in which he/she identifies fearful self-talk, identifies biases in the self-talk, generates alternatives, and tests through behavioral experiments (or assign "Negative Thoughts Trigger Negative Feelings" in the Adult Psychotherapy Homework Planner by [facility]); review and reinforce success, providing corrective feedback toward improvement. Objective Learn and implement personal and interpersonal skills to reduce anxiety and improve interpersonal relationships. Target Date: 2023-04-29 Frequency: Biweekly  Progress: 0 Modality: individual  Related Interventions Use instruction, modeling, and role-playing to build the client's general social, communication, and/or conflict resolution skills. Objective Learn to accept limitations in life and commit to tolerating, rather than avoiding, unpleasant emotions while accomplishing meaningful goals. Target Date: 2023-04-29 Frequency: Biweekly  Progress: 0 Modality: individual  Related Interventions Use techniques from Acceptance and Commitment Therapy to help client accept uncomfortable realities such as lack of complete control, imperfections, and uncertainty and tolerate unpleasant emotions and thoughts in order to accomplish value-consistent goals. 11. Release the emotions associated with past childhood/family issues, resulting in less resentment and more serenity. 12. Resolve past childhood/family issues, leading to less anger and depression, greater self-esteem, security, and confidence. 13. Resolve the core conflict that is the source of anxiety. 14. Stabilize anxiety level while increasing ability to function on a daily basis.  Diagnosis:Generalized anxiety disorder  PTSD (post-traumatic stress  disorder)  Plan:   -meet again on Monday, June 20, 2022 at 4pm.

## 2022-05-24 ENCOUNTER — Encounter: Payer: Self-pay | Admitting: Medical-Surgical

## 2022-05-25 ENCOUNTER — Other Ambulatory Visit: Payer: Self-pay | Admitting: Medical-Surgical

## 2022-05-25 MED ORDER — SUMATRIPTAN SUCCINATE 50 MG PO TABS: 50.0000 mg | ORAL_TABLET | Freq: Every day | ORAL | 2 refills | Status: DC | PRN

## 2022-06-06 ENCOUNTER — Ambulatory Visit: Payer: BC Managed Care – PPO | Admitting: Professional

## 2022-06-20 ENCOUNTER — Other Ambulatory Visit: Payer: Self-pay | Admitting: Medical-Surgical

## 2022-06-20 ENCOUNTER — Ambulatory Visit (INDEPENDENT_AMBULATORY_CARE_PROVIDER_SITE_OTHER): Payer: BC Managed Care – PPO | Admitting: Professional

## 2022-06-20 ENCOUNTER — Encounter: Payer: Self-pay | Admitting: Professional

## 2022-06-20 DIAGNOSIS — F4310 Post-traumatic stress disorder, unspecified: Secondary | ICD-10-CM | POA: Diagnosis not present

## 2022-06-20 DIAGNOSIS — F411 Generalized anxiety disorder: Secondary | ICD-10-CM | POA: Diagnosis not present

## 2022-06-20 NOTE — Progress Notes (Signed)
[facility] Counselor/Therapist Progress Note  Patient ID: Kara Cooper, MRN: [id number],    Date: 06/20/2022  Time Spent: 59 minutes 401-5pm   Treatment Type: Individual Therapy  Risk Assessment: Danger to Self:  No Self-injurious Behavior: No Danger to Others: No  Subjective: This session was held via video teletherapy. The patient consented to video teletherapy and was located in her home during this session. She is aware it is the responsibility of the patient to secure confidentiality on her end of the session. The provider was in a private home office for the duration of this session.    The patient arrived on time for her webex session.  Issues addressed: 1-homework -values self-exploration -pt learned that education and career were the highest -she was surprised that personal life was not more highly ranked 2-self-esteem -since leaving [employer] she has not been as positive -pt values were so intense and she would speak directly to a person -she is used to coming for a socratic thinking method and being transparent -she shared her thoughts with her manager -pt thinks they could learn to work cooperatively -she will be there two years in June 3-anxious a-afraid to ask questions -tries to avoid  Radiation protection practitioner presents as authoritarian -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 Begin a healthy grieving process around the loss. Objective Verbalize resolution of feelings of guilt and regret associated with the loss. Target  Date: 2023-04-29 Frequency: Biweekly  Progress: 0 Modality: individual  Related Interventions Assign the client to make a list of all the regrets associated with actions toward or relationship with the deceased; process the list content toward resolution of these feelings. Objective Decrease unrealistic thoughts, statements, and feelings of being responsible for the loss. Target Date: 2023-04-29 Frequency: Biweekly  Progress: 0 Modality: individual  Related Interventions Use a cognitive therapy approach to identify the client's bias toward thoughts of personal responsibility for the loss and replace them with factual, reality-based thoughts (or assign "Negative Thoughts Trigger Negative Feelings" in the Adult Psychotherapy Homework Planner by Bryn Gulling). Objective Express thoughts and feelings about the deceased that went unexpressed while the deceased was alive. Target Date: 2023-04-29 Frequency: Biweekly  Progress: 0 Modality: individual  Related Interventions Conduct an empty-chair exercise with the client where he/she focuses on expressing to the lost loved one imagined in the chair what he/she never said while that loved one was alive. Assign the client to visit the grave of the lost loved one to "talk to" the deceased and express his/her feelings. Ask the client to write a letter to the lost person describing his/her fond memories and/or painful and regretful memories, and how he/she currently feels life (or assign "Dear : A Letter to a Lost Loved One" in the Adult Psychotherapy Homework Planner by [facility]); process the letter in session. Objective Report decreased time spent each day focusing on the loss. Target Date: 2023-04-29 Frequency: Biweekly  Progress: 0 Modality: individual  Related Interventions Suggest that the client set aside a specific time-limited period each day to focus on mourning his/her loss.  After each day's time is up, the client will resume regular activities and  postpone grieving thoughts until the next scheduled time. For example, mourning times could include putting on dark clothing and/or sad music; clothing would be changed when the allotted time is up. Objective Tell in detail the story of the current loss that is triggering symptoms. Target Date: 2023-04-29 Frequency: Biweekly  Progress: 0 Modality: individual  Related Interventions Use empathy, compassion, and support, allowing the client to tell in detail the story of his/her recent loss. Ask the client to elaborate in an autobiography the circumstances, feelings, and effects of the losses in him/her; assess the characteristics of the loss (e.g., type, suddenness, trauma), previous functioning, current functioning, and coping style. Objective Identify what stages of grief have been experienced in the continuum of the grieving process. Target Date: 2023-04-29 Frequency: Biweekly  Progress: 0 Modality: individual  Related Interventions Educate the client on the stages of the grieving process and answer any questions he/she may have. Assist the client in identifying the stages of grief that he/she has experienced and which stage he/she is presently working through. 2. Demonstrate improved self-esteem through more pride in appearance, more assertiveness, greater eye contact, and identification of positive traits in self-talk messages. 3. Develop a consistent, positive self-image. 4. Develop an awareness of how childhood issues have affected and continue to affect one's family life. Objective Describe what it was like to grow up in the home environment. Target Date: 2023-04-29 Frequency: Biweekly  Progress: 0 Modality: individual  Related Interventions Develop the client's family genogram and/or symptom line and help identify patterns of dysfunction within the family. Objective Describe each family member and identify the role each played within the family. Target Date: 2023-04-29 Frequency:  Biweekly  Progress: 0 Modality: individual  Related Interventions Assist the client in clarifying his/her role within the family and his/her feelings connected to that role. Objective Identify how own parenting has been influenced by childhood experiences. Target Date: 2023-04-29 Frequency: Biweekly  Progress: 0 Modality: individual  Related Interventions Ask the client to compare his/her parenting behavior to that of parent figures of his/her childhood; encourage the client to be aware of how easily we repeat patterns that we grew up with. Objective Decrease feelings of shame by being able to verbally affirm self as not responsible for abuse. Target Date: 2023-04-29 Frequency: Biweekly  Progress: 0 Modality: individual  Related Interventions Assign writing a letter to mother, father, or other abuser in which the client expresses his/her feelings regarding the abuse. Guide the client in an empty chair exercise with a key figure connected to the abuse (i.e., perpetrator, sibling, or parent); reinforce the client for placing responsibility for the abuse or neglect on the caretaker. Consistently reiterate that responsibility for the abuse falls on the abusive adults, not the surviving child (for deserving the abuse), and reinforce statements that accurately reflect placing blame on perpetrators and on nonprotective, nonnurturant adults. Objective Decrease statements of being a victim while increasing statements that reflect personal empowerment. Target Date: 2023-04-29 Frequency: Biweekly  Progress: 0 Modality: individual  Related Interventions Ask the client to complete an exercise that identifies the positives and negatives of being a victim and the positives and negatives of being a survivor; compare and process the lists. Encourage and reinforce the client's statements that reflect movement away from viewing self as a victim and toward personal empowerment as a survivor (or assign "Changing  from Victim to Survivor" in the Adult Psychotherapy Homework Planner by Bryn Gulling). Objective  Identify patterns of abuse, neglect, or abandonment within the family of origin, both current and historical, nuclear and extended. Target Date: 2023-04-29 Frequency: Biweekly  Progress: 0 Modality: individual  Related Interventions Explore the client's painful childhood experiences (or assign "Share the Painful Memory" in the Adult Psychotherapy Homework Planner by Bryn Gulling). 5. Elevate self-esteem. Objective Acknowledge feeling less competent than most others. Target Date: 2023-04-29 Frequency: Biweekly  Progress: 0 Modality: individual  Related Interventions Explore the client's assessment of himself/herself and what is verbalized as the basis for negative self-perception. Objective Increase insight into the historical and current sources of low self-esteem. Target Date: 2023-04-29 Frequency: Biweekly  Progress: 0 Modality: individual  Related Interventions Discuss, emphasize, and interpret the client's incidents of abuse (emotional, physical, and sexual) and how they have impacted his/her feelings about himself/herself. Help the client become aware of his/her fear of rejection and its connection with past rejection or abandonment experiences; begin to contrast past experiences of pain with present experiences of acceptance and competence. Objective Decrease the frequency of negative self-descriptive statements and increase frequency of positive self-descriptive statements. Target Date: 2023-04-29 Frequency: Biweekly  Progress: 0 Modality: individual  Related Interventions Assist the client in becoming aware of how he/she expresses or acts out negative feelings about himself/herself. Help the client reframe his/her negative assessment of himself/herself. Assist the client in developing positive self-talk as a way of boosting his/her confidence and self-image (or assign "Positive Self-Talk" in the  Adult Psychotherapy Homework Planner by Bryn Gulling). Objective Identify and replace negative self-talk messages used to reinforce low self-esteem. Target Date: 2023-04-29 Frequency: Biweekly  Progress: 0 Modality: individual  Related Interventions Help the client identify his/her distorted, negative beliefs about self and the world and replace these messages with more realistic, affirmative messages (or assign "Journal and Replace Self-Defeating Thoughts" in the Adult Psychotherapy Homework Planner by [facility] or read What to Say When You Talk to Yourself by [employer]). Objective Identify and engage in activities that would improve self-image by being consistent with one's values. Target Date: 2023-04-29 Frequency: Biweekly  Progress: 0 Modality: individual  Related Interventions Help the client analyze his/her values and the congruence or incongruence between them and the client's daily activities. Identify and assign activities congruent with the client's values; process them toward improving self-concept and self-esteem. Objective Take verbal responsibility for accomplishments without discounting. Target Date: 2023-04-29 Frequency: Biweekly  Progress: 0 Modality: individual  Related Interventions Ask the client to list accomplishments; process the integration of these into his/her self-image. Objective Identify any secondary gain that is received by speaking negatively about self and refusing to take any risks. Target Date: 2023-04-29 Frequency: Biweekly  Progress: 0 Modality: individual  Related Interventions Teach the client the meaning and power of secondary gain in maintaining negative behavior patterns. Assist the client in identifying how self-disparagement and avoidance of risk-taking could bring secondary gain (e.g., praise from others, others taking over responsibilities). 6. Enhance ability to effectively cope with the full variety of life's worries and anxieties. 7. Establish  an inward sense of self-worth, confidence, and competence. 8. Learn and implement coping skills that result in a reduction of anxiety and worry, and improved daily functioning. 9. Let go of blame and begin to forgive others for pain caused in childhood. 10. Reduce overall frequency, intensity, and duration of the anxiety so that daily functioning is not impaired. Objective Describe situations, thoughts, feelings, and actions associated with anxieties and worries, their impact on functioning, and attempts to resolve them. Target Date: 2023-04-29 Frequency: Biweekly  Progress: 0 Modality: individual  Related Interventions Ask the client to describe his/her past experiences of anxiety and their impact on functioning; assess the focus, excessiveness, and uncontrollability of the worry and the type, frequency, intensity, and duration of his/her anxiety symptoms (consider using a structured interview such as The Anxiety Disorders Interview Schedule-Adult Version). Objective Verbalize an understanding of the cognitive, physiological, and behavioral components of anxiety and its treatment. Target Date: 2023-04-29 Frequency: Biweekly  Progress: 0 Modality: individual  Related Interventions Discuss how generalized anxiety typically involves excessive worry about unrealistic threats, various bodily expressions of tension, overarousal, and hypervigilance, and avoidance of what is threatening that interact to maintain the problem (see Mastery of Your Anxiety and Worry: Therapist Guide by Corky Mull, and Barlow; Treating Generalized Anxiety Disorder by Rygh and Amparo Bristol). Objective Learn and implement calming skills to reduce overall anxiety and manage anxiety symptoms. Target Date: 2023-04-29 Frequency: Biweekly  Progress: 0 Modality: individual  Related Interventions Teach the client calming/relaxation skills (e.g., applied relaxation, progressive muscle relaxation, cue controlled relaxation; mindful  breathing; biofeedback) and how to discriminate better between relaxation and tension; teach the client how to apply these skills to his/her daily life (e.g., New Directions in Progressive Muscle Relaxation by Casper Harrison, and Hazlett-Stevens; Treating Generalized Anxiety Disorder by Rygh and Amparo Bristol). Assign the client homework each session in which he/she practices relaxation exercises daily, gradually applying them progressively from non-anxiety-provoking to anxiety-provoking situations; review and reinforce success while providing corrective feedback toward improvement. Assign the client to read about progressive muscle relaxation and other calming strategies in relevant books or treatment manuals (e.g., Progressive Relaxation Training by Gwynneth Aliment and Dani Gobble; Mastery of Your Anxiety and Worry: Workbook by Beckie Busing). Objective Learn and implement a strategy to limit the association between various environmental settings and worry, delaying the worry until a designated "worry time." Target Date: 2023-04-29 Frequency: Biweekly  Progress: 0 Modality: individual  Related Interventions Explain the rationale for using a worry time as well as how it is to be used; agree upon and implement a worry time with the client. Teach the client how to recognize, stop, and postpone worry to the agreed upon worry time using skills such as thought stopping, relaxation, and redirecting attention (or assign "Making Use of the Thought-Stopping Technique" and/or "Worry Time" in the Adult Psychotherapy Homework Planner by [employer] to assist skill development); encourage use in daily life; review and reinforce success while providing corrective feedback toward improvement. Objective Verbalize an understanding of the role that cognitive biases play in excessive irrational worry and persistent anxiety symptoms. Target Date: 2023-04-29 Frequency: Biweekly  Progress: 0 Modality: individual  Related  Interventions Assist the client in analyzing his/her worries by examining potential biases such as the probability of the negative expectation occurring, the real consequences of it occurring, his/her ability to control the outcome, the worst possible outcome, and his/her ability to accept it (see "Analyze the Probability of a Feared Event" in the Adult Psychotherapy Homework Planner by Bryn Gulling; Cognitive Therapy of Anxiety Disorders by Alison Stalling). Objective Identify, challenge, and replace biased, fearful self-talk with positive, realistic, and empowering self-talk. Target Date: 2023-04-29 Frequency: Biweekly  Progress: 0 Modality: individual  Related Interventions Explore the client's schema and self-talk that mediate his/her fear response; assist him/her in challenging the biases; replace the distorted messages with reality-based alternatives and positive, realistic self-talk that will increase his/her self-confidence in coping with irrational fears (see Cognitive Therapy of Anxiety Disorders by Alison Stalling). Assign the client a  homework exercise in which he/she identifies fearful self-talk, identifies biases in the self-talk, generates alternatives, and tests through behavioral experiments (or assign "Negative Thoughts Trigger Negative Feelings" in the Adult Psychotherapy Homework Planner by [facility]); review and reinforce success, providing corrective feedback toward improvement. Objective Learn and implement personal and interpersonal skills to reduce anxiety and improve interpersonal relationships. Target Date: 2023-04-29 Frequency: Biweekly  Progress: 0 Modality: individual  Related Interventions Use instruction, modeling, and role-playing to build the client's general social, communication, and/or conflict resolution skills. Objective Learn to accept limitations in life and commit to tolerating, rather than avoiding, unpleasant emotions while accomplishing meaningful goals. Target  Date: 2023-04-29 Frequency: Biweekly  Progress: 0 Modality: individual  Related Interventions Use techniques from Acceptance and Commitment Therapy to help client accept uncomfortable realities such as lack of complete control, imperfections, and uncertainty and tolerate unpleasant emotions and thoughts in order to accomplish value-consistent goals. 11. Release the emotions associated with past childhood/family issues, resulting in less resentment and more serenity. 12. Resolve past childhood/family issues, leading to less anger and depression, greater self-esteem, security, and confidence. 13. Resolve the core conflict that is the source of anxiety. 14. Stabilize anxiety level while increasing ability to function on a daily basis.  Diagnosis:Generalized anxiety disorder  PTSD (post-traumatic stress disorder)  Plan:  -establish hypothesis "I am not good enough" -meet again on Monday, July 04, 2022 at 4pm.

## 2022-06-25 ENCOUNTER — Other Ambulatory Visit: Payer: Self-pay | Admitting: Medical-Surgical

## 2022-07-04 ENCOUNTER — Ambulatory Visit (INDEPENDENT_AMBULATORY_CARE_PROVIDER_SITE_OTHER): Payer: BC Managed Care – PPO | Admitting: Professional

## 2022-07-04 ENCOUNTER — Encounter: Payer: Self-pay | Admitting: Professional

## 2022-07-04 DIAGNOSIS — F4310 Post-traumatic stress disorder, unspecified: Secondary | ICD-10-CM | POA: Diagnosis not present

## 2022-07-04 DIAGNOSIS — F411 Generalized anxiety disorder: Secondary | ICD-10-CM
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
## 2022-07-18 ENCOUNTER — Ambulatory Visit (INDEPENDENT_AMBULATORY_CARE_PROVIDER_SITE_OTHER): Payer: BC Managed Care – PPO | Admitting: Professional

## 2022-07-18 ENCOUNTER — Encounter: Payer: Self-pay | Admitting: Professional

## 2022-07-18 DIAGNOSIS — F411 Generalized anxiety disorder: Secondary | ICD-10-CM | POA: Diagnosis not present

## 2022-07-18 DIAGNOSIS — F4310 Post-traumatic stress disorder, unspecified: Secondary | ICD-10-CM | POA: Diagnosis not present

## 2022-07-18 NOTE — Progress Notes (Signed)
[facility] Counselor/Therapist Progress Note  Patient ID: Kara Cooper, MRN: [id number],    Date: 07/18/2022  Time Spent: 50 minutes [id number]   Treatment Type: Individual Therapy  Risk Assessment: Danger to Self:  No Self-injurious Behavior: No Danger to Others: No  Subjective: This session was held via video teletherapy. The patient consented to video teletherapy and was located in her home during this session. She is aware it is the responsibility of the patient to secure confidentiality on her end of the session. The provider was in a private home office for the duration of this session.    The patient arrived on time for her webex session.  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 Begin a healthy grieving process around the loss. Objective Verbalize resolution of feelings of guilt and regret associated with the loss. Target Date: 2023-04-29 Frequency: Biweekly  Progress: 5 Modality: individual  Related Interventions Assign the client to make a list of all the regrets associated with actions toward or relationship with the deceased; process the list content toward resolution of these feelings. Objective Decrease unrealistic thoughts, statements, and feelings of being responsible for the loss. Target Date: 2023-04-29 Frequency: Biweekly   Progress: 0 Modality: individual  Related Interventions Use a cognitive therapy approach to identify the client's bias toward thoughts of personal responsibility for the loss and replace them with factual, reality-based thoughts (or assign "Negative Thoughts Trigger Negative Feelings" in the Adult Psychotherapy Homework Planner by Bryn Gulling). Objective Express thoughts and feelings about the deceased that went unexpressed while the deceased was alive. Target Date: 2023-04-29 Frequency: Biweekly  Progress: 80 Modality: individual  Related Interventions Conduct an empty-chair exercise with the client where he/she focuses on expressing to the lost loved one imagined in the chair what he/she never said while that loved one was alive. Assign the client to visit the grave of the lost loved one to "talk to" the deceased and express his/her feelings. Ask the client to write a letter to the lost person describing his/her fond memories and/or painful and regretful memories, and how he/she currently feels life (or assign "Dear : A Letter to a Lost Loved One" in the Adult Psychotherapy Homework Planner by [facility]); process the letter in session. Objective Report decreased time spent each day focusing on the loss. Target Date: 2023-04-29 Frequency: Biweekly  Progress: 75 Modality: individual  Related Interventions Suggest that the client set aside a specific time-limited period each day to focus on mourning his/her loss. After each day's time is up, the client will resume regular activities and postpone grieving thoughts until the next scheduled time. For example, mourning times could include putting on dark clothing and/or sad music; clothing would be changed when the allotted time is up. Objective Tell in detail the story of the current loss that is triggering symptoms. Target Date: 2023-04-29 Frequency: Biweekly  Progress: 100 Modality: individual  Related Interventions Use empathy, compassion, and  support, allowing the client to tell in detail the story of his/her recent loss. Ask the client to elaborate in an autobiography the circumstances, feelings, and effects of the losses in him/her; assess the characteristics of the loss (e.g., type, suddenness, trauma), previous functioning, current functioning, and coping style. Objective Identify what stages of grief have been experienced in the continuum of the grieving process. (Marriage, Erica's diabetes-she has always been brittle) Target Date: 2023-04-29 Frequency: Biweekly  Progress: 50 Modality: individual  Related Interventions Educate the client on the stages of the grieving process and answer any questions he/she may have. Assist the client in identifying the stages of grief that he/she has experienced and which stage he/she is presently working through. 2. Demonstrate improved self-esteem through more pride in appearance, more assertiveness, greater eye contact, and identification of positive traits in self-talk messages. 3. Develop a consistent, positive self-image. 4. Develop an awareness of how childhood issues have affected and continue to affect one's family life. Objective Describe what it was like to grow up in the home environment. Target Date: 2023-04-29 Frequency: Biweekly  Progress: 0 Modality: individual  Related Interventions Develop the client's family genogram and/or symptom line and help identify patterns of dysfunction within the family. Objective Describe each family member and identify the role each played within the family. Target Date: 2023-04-29 Frequency: Biweekly  Progress: 0 Modality: individual  Related Interventions Assist the client in clarifying his/her role within the family and his/her feelings connected to that role. Objective Identify how own parenting has been influenced by childhood experiences. Target Date: 2023-04-29 Frequency: Biweekly  Progress: 0 Modality: individual  Related  Interventions Ask the client to compare his/her parenting behavior to that of parent figures of his/her childhood; encourage the client to be aware of how easily we repeat patterns that we grew up with. Objective Decrease feelings of shame by being able to verbally affirm self as not responsible for abuse. Target Date: 2023-04-29 Frequency: Biweekly  Progress: 0 Modality: individual  Related Interventions Assign writing a letter to mother, father, or other abuser in which the client expresses his/her feelings regarding the abuse. Guide the client in an empty chair exercise with a key figure connected to the abuse (i.e., perpetrator, sibling, or parent); reinforce the client for placing responsibility for the abuse or neglect on the caretaker. Consistently reiterate that responsibility for the abuse falls on the abusive adults, not the surviving child (for deserving the abuse), and reinforce statements that accurately reflect placing blame on perpetrators and on nonprotective, nonnurturant adults. Objective Decrease statements of being a victim while increasing statements that reflect personal empowerment. Target Date: 2023-04-29 Frequency: Biweekly  Progress: 0 Modality: individual  Related Interventions Ask the client to complete an exercise that identifies the positives and negatives of being a victim and the positives and negatives of being a survivor; compare and process the lists. Encourage and reinforce the client's statements that reflect movement away from viewing self as a victim and toward personal empowerment as a survivor (or assign "Changing from Victim to Survivor" in the Adult Psychotherapy Homework Planner by Bryn Gulling). Objective Identify patterns of abuse, neglect, or abandonment within the family of origin, both current and historical, nuclear and extended. Target Date: 2023-04-29 Frequency: Biweekly  Progress: 0 Modality: individual  Related Interventions Explore the client's  painful childhood experiences (or assign "Share the Painful Memory" in the Adult Psychotherapy Homework Planner by Bryn Gulling). 5. Elevate self-esteem. Objective Acknowledge feeling less competent than most others. Target Date: 2023-04-29 Frequency: Biweekly  Progress: 0 Modality: individual  Related Interventions Explore the client's assessment of himself/herself and what is verbalized as the basis for negative self-perception. Objective Increase insight into the historical and current sources of low self-esteem. Target Date: 2023-04-29 Frequency: Biweekly  Progress: 0 Modality: individual  Related Interventions Discuss, emphasize, and interpret the client's incidents of abuse (emotional, physical, and sexual) and how they have impacted his/her feelings about himself/herself. Help the client become aware of his/her fear of rejection and its connection with past rejection or abandonment experiences; begin to contrast past experiences of pain with present experiences of acceptance and competence. Objective Decrease the frequency of negative self-descriptive statements and increase frequency of positive self-descriptive statements. Target Date: 2023-04-29 Frequency: Biweekly  Progress: 0 Modality: individual  Related Interventions Assist the client in becoming aware of how he/she expresses or acts out negative feelings about himself/herself. Help the client reframe his/her negative assessment of himself/herself. Assist the client in developing positive self-talk as a way of boosting his/her confidence and self-image (or assign "Positive Self-Talk" in the Adult Psychotherapy Homework Planner by Bryn Gulling). Objective Identify and replace negative self-talk messages used to reinforce low self-esteem. Target Date: 2023-04-29 Frequency: Biweekly  Progress: 0 Modality: individual  Related Interventions Help the client identify his/her distorted, negative beliefs about self and the world and replace  these messages with more realistic, affirmative messages (or assign "Journal and Replace Self-Defeating Thoughts" in the Adult Psychotherapy Homework Planner by [facility] or read What to Say When You Talk to Yourself by [employer]). Objective Identify and engage in activities that would improve self-image by being consistent with one's values. Target Date: 2023-04-29 Frequency: Biweekly  Progress: 0 Modality: individual  Related Interventions Help the client analyze his/her values and the congruence or incongruence between them and the client's daily activities. Identify and assign activities congruent with the client's values; process them toward improving self-concept and self-esteem. Objective Take verbal responsibility for accomplishments without discounting. Target Date: 2023-04-29 Frequency: Biweekly  Progress: 0 Modality: individual  Related Interventions Ask the client to list accomplishments; process the integration of these into his/her self-image. Objective Identify any secondary gain that is received by speaking negatively about self and refusing to take any risks. Target Date: 2023-04-29 Frequency: Biweekly  Progress: 0 Modality: individual  Related Interventions Teach the client the meaning and power of secondary gain in maintaining negative behavior patterns. Assist the client in identifying how self-disparagement and avoidance of risk-taking could bring secondary gain (e.g., praise from others, others taking over responsibilities). 6. Enhance ability to effectively cope with the full variety of life's worries and anxieties. 7. Establish an inward sense of self-worth, confidence, and competence. 8. Learn and implement coping skills that result in a reduction of anxiety and worry, and improved daily functioning. 9. Let go of blame and begin to forgive others for pain caused in childhood. 10. Reduce overall frequency, intensity, and duration of the anxiety so that daily  functioning is not impaired. Objective Describe situations, thoughts, feelings, and actions associated with anxieties and worries, their impact on functioning, and attempts to resolve them. Target Date: 2023-04-29 Frequency: Biweekly  Progress: 0 Modality: individual  Related Interventions Ask the client to describe his/her past experiences of anxiety and their impact on functioning; assess the focus, excessiveness, and uncontrollability of the worry and the type, frequency, intensity, and duration of his/her anxiety symptoms (consider using a structured interview such as The Anxiety Disorders Interview Schedule-Adult Version). Objective Verbalize an understanding of the cognitive, physiological, and behavioral components of anxiety  and its treatment. Target Date: 2023-04-29 Frequency: Biweekly  Progress: 0 Modality: individual  Related Interventions Discuss how generalized anxiety typically involves excessive worry about unrealistic threats, various bodily expressions of tension, overarousal, and hypervigilance, and avoidance of what is threatening that interact to maintain the problem (see Mastery of Your Anxiety and Worry: Therapist Guide by Corky Mull, and Barlow; Treating Generalized Anxiety Disorder by Rygh and Amparo Bristol). Objective Learn and implement calming skills to reduce overall anxiety and manage anxiety symptoms. Target Date: 2023-04-29 Frequency: Biweekly  Progress: 0 Modality: individual  Related Interventions Teach the client calming/relaxation skills (e.g., applied relaxation, progressive muscle relaxation, cue controlled relaxation; mindful breathing; biofeedback) and how to discriminate better between relaxation and tension; teach the client how to apply these skills to his/her daily life (e.g., New Directions in Progressive Muscle Relaxation by Casper Harrison, and Hazlett-Stevens; Treating Generalized Anxiety Disorder by Rygh and Amparo Bristol). Assign the client  homework each session in which he/she practices relaxation exercises daily, gradually applying them progressively from non-anxiety-provoking to anxiety-provoking situations; review and reinforce success while providing corrective feedback toward improvement. Assign the client to read about progressive muscle relaxation and other calming strategies in relevant books or treatment manuals (e.g., Progressive Relaxation Training by Gwynneth Aliment and Dani Gobble; Mastery of Your Anxiety and Worry: Workbook by Beckie Busing). Objective Learn and implement a strategy to limit the association between various environmental settings and worry, delaying the worry until a designated "worry time." Target Date: 2023-04-29 Frequency: Biweekly  Progress: 0 Modality: individual  Related Interventions Explain the rationale for using a worry time as well as how it is to be used; agree upon and implement a worry time with the client. Teach the client how to recognize, stop, and postpone worry to the agreed upon worry time using skills such as thought stopping, relaxation, and redirecting attention (or assign "Making Use of the Thought-Stopping Technique" and/or "Worry Time" in the Adult Psychotherapy Homework Planner by [employer] to assist skill development); encourage use in daily life; review and reinforce success while providing corrective feedback toward improvement. Objective Verbalize an understanding of the role that cognitive biases play in excessive irrational worry and persistent anxiety symptoms. Target Date: 2023-04-29 Frequency: Biweekly  Progress: 0 Modality: individual  Related Interventions Assist the client in analyzing his/her worries by examining potential biases such as the probability of the negative expectation occurring, the real consequences of it occurring, his/her ability to control the outcome, the worst possible outcome, and his/her ability to accept it (see "Analyze the Probability of a Feared  Event" in the Adult Psychotherapy Homework Planner by Bryn Gulling; Cognitive Therapy of Anxiety Disorders by Alison Stalling). Objective Identify, challenge, and replace biased, fearful self-talk with positive, realistic, and empowering self-talk. Target Date: 2023-04-29 Frequency: Biweekly  Progress: 0 Modality: individual  Related Interventions Explore the client's schema and self-talk that mediate his/her fear response; assist him/her in challenging the biases; replace the distorted messages with reality-based alternatives and positive, realistic self-talk that will increase his/her self-confidence in coping with irrational fears (see Cognitive Therapy of Anxiety Disorders by Alison Stalling). Assign the client a homework exercise in which he/she identifies fearful self-talk, identifies biases in the self-talk, generates alternatives, and tests through behavioral experiments (or assign "Negative Thoughts Trigger Negative Feelings" in the Adult Psychotherapy Homework Planner by [facility]); review and reinforce success, providing corrective feedback toward improvement. Objective Learn and implement personal and interpersonal skills to reduce anxiety and improve interpersonal relationships. Target Date: 2023-04-29 Frequency: Biweekly  Progress: 0 Modality:  individual  Related Interventions Use instruction, modeling, and role-playing to build the client's general social, communication, and/or conflict resolution skills. Objective Learn to accept limitations in life and commit to tolerating, rather than avoiding, unpleasant emotions while accomplishing meaningful goals. Target Date: 2023-04-29 Frequency: Biweekly  Progress: 0 Modality: individual  Related Interventions Use techniques from Acceptance and Commitment Therapy to help client accept uncomfortable realities such as lack of complete control, imperfections, and uncertainty and tolerate unpleasant emotions and thoughts in order to accomplish  value-consistent goals. 11. Release the emotions associated with past childhood/family issues, resulting in less resentment and more serenity. 12. Resolve past childhood/family issues, leading to less anger and depression, greater self-esteem, security, and confidence. 13. Resolve the core conflict that is the source of anxiety. 14. Stabilize anxiety level while increasing ability to function on a daily basis.  Diagnosis:Generalized anxiety disorder  PTSD (post-traumatic stress disorder)  Plan:  -sticky notes with positive messages -positive affirmations -in public try and come up with alternative healthier solutions -meet again on Monday, August 01, 2022 at 4pm.

## 2022-08-01 ENCOUNTER — Ambulatory Visit (INDEPENDENT_AMBULATORY_CARE_PROVIDER_SITE_OTHER): Payer: BC Managed Care – PPO | Admitting: Professional

## 2022-08-01 ENCOUNTER — Encounter: Payer: Self-pay | Admitting: Professional

## 2022-08-01 DIAGNOSIS — F411 Generalized anxiety disorder: Secondary | ICD-10-CM | POA: Diagnosis not present

## 2022-08-01 DIAGNOSIS — F4310 Post-traumatic stress disorder, unspecified: Secondary | ICD-10-CM | POA: Diagnosis not present
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
## 2022-08-15 ENCOUNTER — Encounter: Payer: Self-pay | Admitting: Professional

## 2022-08-15 ENCOUNTER — Ambulatory Visit (INDEPENDENT_AMBULATORY_CARE_PROVIDER_SITE_OTHER): Payer: BC Managed Care – PPO | Admitting: Professional

## 2022-08-15 DIAGNOSIS — F4310 Post-traumatic stress disorder, unspecified: Secondary | ICD-10-CM

## 2022-08-15 DIAGNOSIS — F411 Generalized anxiety disorder: Secondary | ICD-10-CM | POA: Diagnosis not present

## 2022-08-15 NOTE — Progress Notes (Signed)
[facility] Counselor/Therapist Progress Note  Patient ID: Kara Cooper, MRN: [id number],    Date: 08/15/2022  Time Spent: 40 minutes [id number]   Treatment Type: Individual Therapy  Risk Assessment: Danger to Self:  No Self-injurious Behavior: No Danger to Others: No  Subjective: This session was held via video teletherapy. The patient consented to video teletherapy and was located in her home during this session. She is aware it is the responsibility of the patient to secure confidentiality on her end of the session. The provider was in a private home office for the duration of this session.    The patient arrived on time for her Caregility session.  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 -how pt manages the patient's that "hit too close to home" -"I used to let myself get too close but Lavenia Atlas been very careful about those things" -pt's boss escalates things into a crisis quickly -boss cut her off because she said she knew the pt would go into personal details and she told her to "keep it high level" -pt has noticed she treats other coworkers this way as well and can now depersonalize -pt thinks that senior leadership would have to notice 3-personally a-pt reports things come in waves g-she worked Saturday and then was home Saturday afternoon -she had too much time to think and that she ruminates over if she raised the children well enough -worries about what happened in her life to create this "heaviness" c-pt becomes worried by people who do not abide by [facility]  rules -pt admits that her neighbor's gossiping caused her to be triggered -pt likes structure and when the structure is off-balance makes things wonky -she doesn't appreciate that she follows the rules and it appears other people break rules and are not held accountable d-pt admits that her worry has been there since she was a child -she wanted to be noticed in a positive way and always pushed to be perfect e-how to invest in community activities that showcases her talents -would build a support network, help others, and provide opportunities for her career  Treatment Plan (reviewed on 07/04/2022) Problems Addressed  Anxiety, Childhood Trauma, Grief /Loss Unresolved, Low Self-Esteem  Goals 1. Begin a healthy grieving process around the loss. Objective Verbalize resolution of feelings of guilt and regret associated with the loss. Target Date: 2023-04-29 Frequency: Biweekly  Progress: 5 Modality: individual  Related Interventions Assign the client to make a list of all the regrets associated with actions toward or relationship with the deceased; process the list content toward resolution of these feelings. Objective Decrease unrealistic thoughts, statements, and feelings of being responsible for the loss. Target Date: 2023-04-29 Frequency: Biweekly  Progress: 0 Modality: individual  Related Interventions Use a cognitive therapy approach to identify the client's bias toward thoughts of personal responsibility for the loss and replace them with factual, reality-based thoughts (or assign "Negative Thoughts Trigger Negative Feelings" in the Adult Psychotherapy Homework Planner by Stephannie Li). Objective Express thoughts and feelings about the deceased that went unexpressed while the deceased was alive. Target Date: 2023-04-29 Frequency: Biweekly  Progress: 80 Modality: individual  Related Interventions Conduct an empty-chair  exercise with the client where he/she focuses on expressing to the lost  loved one imagined in the chair what he/she never said while that loved one was alive. Assign the client to visit the grave of the lost loved one to "talk to" the deceased and express his/her feelings. Ask the client to write a letter to the lost person describing his/her fond memories and/or painful and regretful memories, and how he/she currently feels life (or assign "Dear : A Letter to a Lost Loved One" in the Adult Psychotherapy Homework Planner by [facility]); process the letter in session. Objective Report decreased time spent each day focusing on the loss. Target Date: 2023-04-29 Frequency: Biweekly  Progress: 75 Modality: individual  Related Interventions Suggest that the client set aside a specific time-limited period each day to focus on mourning his/her loss. After each day's time is up, the client will resume regular activities and postpone grieving thoughts until the next scheduled time. For example, mourning times could include putting on dark clothing and/or sad music; clothing would be changed when the allotted time is up. Objective Tell in detail the story of the current loss that is triggering symptoms. Target Date: 2023-04-29 Frequency: Biweekly  Progress: 100 Modality: individual  Related Interventions Use empathy, compassion, and support, allowing the client to tell in detail the story of his/her recent loss. Ask the client to elaborate in an autobiography the circumstances, feelings, and effects of the losses in him/her; assess the characteristics of the loss (e.g., type, suddenness, trauma), previous functioning, current functioning, and coping style. Objective Identify what stages of grief have been experienced in the continuum of the grieving process. (Marriage, Erica's diabetes-she has always been brittle) Target Date: 2023-04-29 Frequency: Biweekly  Progress: 50 Modality: individual  Related Interventions Educate the client on the stages of the grieving process and  answer any questions he/she may have. Assist the client in identifying the stages of grief that he/she has experienced and which stage he/she is presently working through. 2. Demonstrate improved self-esteem through more pride in appearance, more assertiveness, greater eye contact, and identification of positive traits in self-talk messages. 3. Develop a consistent, positive self-image. 4. Develop an awareness of how childhood issues have affected and continue to affect one's family life. Objective Describe what it was like to grow up in the home environment. Target Date: 2023-04-29 Frequency: Biweekly  Progress: 0 Modality: individual  Related Interventions Develop the client's family genogram and/or symptom line and help identify patterns of dysfunction within the family. Objective Describe each family member and identify the role each played within the family. Target Date: 2023-04-29 Frequency: Biweekly  Progress: 0 Modality: individual  Related Interventions Assist the client in clarifying his/her role within the family and his/her feelings connected to that role. Objective Identify how own parenting has been influenced by childhood experiences. Target Date: 2023-04-29 Frequency: Biweekly  Progress: 0 Modality: individual  Related Interventions Ask the client to compare his/her parenting behavior to that of parent figures of his/her childhood; encourage the client to be aware of how easily we repeat patterns that we grew up with. Objective Decrease feelings of shame by being able to verbally affirm self as not responsible for abuse. Target Date: 2023-04-29 Frequency: Biweekly  Progress: 0 Modality: individual  Related Interventions Assign writing a letter to mother, father, or other abuser in which the client expresses his/her feelings regarding the abuse. Guide the client in an empty chair exercise with a key figure connected to the abuse (i.e., perpetrator, sibling, or  parent);  reinforce the client for placing responsibility for the abuse or neglect on the caretaker. Consistently reiterate that responsibility for the abuse falls on the abusive adults, not the surviving child (for deserving the abuse), and reinforce statements that accurately reflect placing blame on perpetrators and on nonprotective, nonnurturant adults. Objective Decrease statements of being a victim while increasing statements that reflect personal empowerment. Target Date: 2023-04-29 Frequency: Biweekly  Progress: 0 Modality: individual  Related Interventions Ask the client to complete an exercise that identifies the positives and negatives of being a victim and the positives and negatives of being a survivor; compare and process the lists. Encourage and reinforce the client's statements that reflect movement away from viewing self as a victim and toward personal empowerment as a survivor (or assign "Changing from Victim to Survivor" in the Adult Psychotherapy Homework Planner by Stephannie Li). Objective Identify patterns of abuse, neglect, or abandonment within the family of origin, both current and historical, nuclear and extended. Target Date: 2023-04-29 Frequency: Biweekly  Progress: 0 Modality: individual  Related Interventions Explore the client's painful childhood experiences (or assign "Share the Painful Memory" in the Adult Psychotherapy Homework Planner by Stephannie Li). 5. Elevate self-esteem. Objective Acknowledge feeling less competent than most others. Target Date: 2023-04-29 Frequency: Biweekly  Progress: 0 Modality: individual  Related Interventions Explore the client's assessment of himself/herself and what is verbalized as the basis for negative self-perception. Objective Increase insight into the historical and current sources of low self-esteem. Target Date: 2023-04-29 Frequency: Biweekly  Progress: 0 Modality: individual  Related Interventions Discuss, emphasize, and interpret the  client's incidents of abuse (emotional, physical, and sexual) and how they have impacted his/her feelings about himself/herself. Help the client become aware of his/her fear of rejection and its connection with past rejection or abandonment experiences; begin to contrast past experiences of pain with present experiences of acceptance and competence. Objective Decrease the frequency of negative self-descriptive statements and increase frequency of positive self-descriptive statements. Target Date: 2023-04-29 Frequency: Biweekly  Progress: 0 Modality: individual  Related Interventions Assist the client in becoming aware of how he/she expresses or acts out negative feelings about himself/herself. Help the client reframe his/her negative assessment of himself/herself. Assist the client in developing positive self-talk as a way of boosting his/her confidence and self-image (or assign "Positive Self-Talk" in the Adult Psychotherapy Homework Planner by Stephannie Li). Objective Identify and replace negative self-talk messages used to reinforce low self-esteem. Target Date: 2023-04-29 Frequency: Biweekly  Progress: 0 Modality: individual  Related Interventions Help the client identify his/her distorted, negative beliefs about self and the world and replace these messages with more realistic, affirmative messages (or assign "Journal and Replace Self-Defeating Thoughts" in the Adult Psychotherapy Homework Planner by [facility] or read What to Say When You Talk to Yourself by Helmstetter). Objective Identify and engage in activities that would improve self-image by being consistent with one's values. Target Date: 2023-04-29 Frequency: Biweekly  Progress: 0 Modality: individual  Related Interventions Help the client analyze his/her values and the congruence or incongruence between them and the client's daily activities. Identify and assign activities congruent with the client's values; process them toward improving  self-concept and self-esteem. Objective Take verbal responsibility for accomplishments without discounting. Target Date: 2023-04-29 Frequency: Biweekly  Progress: 0 Modality: individual  Related Interventions Ask the client to list accomplishments; process the integration of these into his/her self-image. Objective Identify any secondary gain that is received by speaking negatively about self and refusing to take any risks. Target Date: 2023-04-29 Frequency: Biweekly  Progress: 0 Modality: individual  Related Interventions Teach the client the meaning and power of secondary gain in maintaining negative behavior patterns. Assist the client in identifying how self-disparagement and avoidance of risk-taking could bring secondary gain (e.g., praise from others, others taking over responsibilities). 6. Enhance ability to effectively cope with the full variety of life's worries and anxieties. 7. Establish an inward sense of self-worth, confidence, and competence. 8. Learn and implement coping skills that result in a reduction of anxiety and worry, and improved daily functioning. 9. Let go of blame and begin to forgive others for pain caused in childhood. 10. Reduce overall frequency, intensity, and duration of the anxiety so that daily functioning is not impaired. Objective Describe situations, thoughts, feelings, and actions associated with anxieties and worries, their impact on functioning, and attempts to resolve them. Target Date: 2023-04-29 Frequency: Biweekly  Progress: 0 Modality: individual  Related Interventions Ask the client to describe his/her past experiences of anxiety and their impact on functioning; assess the focus, excessiveness, and uncontrollability of the worry and the type, frequency, intensity, and duration of his/her anxiety symptoms (consider using a structured interview such as The Anxiety Disorders Interview Schedule-Adult Version). Objective Verbalize an understanding  of the cognitive, physiological, and behavioral components of anxiety and its treatment. Target Date: 2023-04-29 Frequency: Biweekly  Progress: 0 Modality: individual  Related Interventions Discuss how generalized anxiety typically involves excessive worry about unrealistic threats, various bodily expressions of tension, overarousal, and hypervigilance, and avoidance of what is threatening that interact to maintain the problem (see Mastery of Your Anxiety and Worry: Therapist Guide by Renard Matter, and Barlow; Treating Generalized Anxiety Disorder by Rygh and Ida Rogue). Objective Learn and implement calming skills to reduce overall anxiety and manage anxiety symptoms. Target Date: 2023-04-29 Frequency: Biweekly  Progress: 0 Modality: individual  Related Interventions Teach the client calming/relaxation skills (e.g., applied relaxation, progressive muscle relaxation, cue controlled relaxation; mindful breathing; biofeedback) and how to discriminate better between relaxation and tension; teach the client how to apply these skills to his/her daily life (e.g., New Directions in Progressive Muscle Relaxation by Marcelyn Ditty, and Hazlett-Stevens; Treating Generalized Anxiety Disorder by Rygh and Ida Rogue). Assign the client homework each session in which he/she practices relaxation exercises daily, gradually applying them progressively from non-anxiety-provoking to anxiety-provoking situations; review and reinforce success while providing corrective feedback toward improvement. Assign the client to read about progressive muscle relaxation and other calming strategies in relevant books or treatment manuals (e.g., Progressive Relaxation Training by Robb Matar and Alen Blew; Mastery of Your Anxiety and Worry: Workbook by Earlie Counts). Objective Learn and implement a strategy to limit the association between various environmental settings and worry, delaying the worry until a designated "worry  time." Target Date: 2023-04-29 Frequency: Biweekly  Progress: 0 Modality: individual  Related Interventions Explain the rationale for using a worry time as well as how it is to be used; agree upon and implement a worry time with the client. Teach the client how to recognize, stop, and postpone worry to the agreed upon worry time using skills such as thought stopping, relaxation, and redirecting attention (or assign "Making Use of the Thought-Stopping Technique" and/or "Worry Time" in the Adult Psychotherapy Homework Planner by Jongsma to assist skill development); encourage use in daily life; review and reinforce success while providing corrective feedback toward improvement. Objective Verbalize an understanding of the role that cognitive biases play in excessive irrational worry and persistent anxiety symptoms. Target Date: 2023-04-29 Frequency: Biweekly  Progress: 0 Modality: individual  Related Interventions Assist the client in analyzing his/her worries by examining potential biases such as the probability of the negative expectation occurring, the real consequences of it occurring, his/her ability to control the outcome, the worst possible outcome, and his/her ability to accept it (see "Analyze the Probability of a Feared Event" in the Adult Psychotherapy Homework Planner by Stephannie Li; Cognitive Therapy of Anxiety Disorders by Laurence Slate). Objective Identify, challenge, and replace biased, fearful self-talk with positive, realistic, and empowering self-talk. Target Date: 2023-04-29 Frequency: Biweekly  Progress: 0 Modality: individual  Related Interventions Explore the client's schema and self-talk that mediate his/her fear response; assist him/her in challenging the biases; replace the distorted messages with reality-based alternatives and positive, realistic self-talk that will increase his/her self-confidence in coping with irrational fears (see Cognitive Therapy of Anxiety Disorders by  Laurence Slate). Assign the client a homework exercise in which he/she identifies fearful self-talk, identifies biases in the self-talk, generates alternatives, and tests through behavioral experiments (or assign "Negative Thoughts Trigger Negative Feelings" in the Adult Psychotherapy Homework Planner by [facility]); review and reinforce success, providing corrective feedback toward improvement. Objective Learn and implement personal and interpersonal skills to reduce anxiety and improve interpersonal relationships. Target Date: 2023-04-29 Frequency: Biweekly  Progress: 0 Modality: individual  Related Interventions Use instruction, modeling, and role-playing to build the client's general social, communication, and/or conflict resolution skills. Objective Learn to accept limitations in life and commit to tolerating, rather than avoiding, unpleasant emotions while accomplishing meaningful goals. Target Date: 2023-04-29 Frequency: Biweekly  Progress: 0 Modality: individual  Related Interventions Use techniques from Acceptance and Commitment Therapy to help client accept uncomfortable realities such as lack of complete control, imperfections, and uncertainty and tolerate unpleasant emotions and thoughts in order to accomplish value-consistent goals. 11. Release the emotions associated with past childhood/family issues, resulting in less resentment and more serenity. 12. Resolve past childhood/family issues, leading to less anger and depression, greater self-esteem, security, and confidence. 13. Resolve the core conflict that is the source of anxiety. 14. Stabilize anxiety level while increasing ability to function on a daily basis.  Diagnosis:Generalized anxiety disorder  PTSD (post-traumatic stress disorder)  Plan:  -explore activities to see if she can find something she enjoys -at next session, discuss patient's fear of huritng someone's feelings or becoming preoccupied -meet again on Monday,  Aug 29, 2022 at 4pm.

## 2022-08-29 ENCOUNTER — Encounter: Payer: Self-pay | Admitting: Professional

## 2022-08-29 ENCOUNTER — Ambulatory Visit (INDEPENDENT_AMBULATORY_CARE_PROVIDER_SITE_OTHER): Payer: BC Managed Care – PPO | Admitting: Professional

## 2022-08-29 DIAGNOSIS — F411 Generalized anxiety disorder: Secondary | ICD-10-CM

## 2022-08-29 DIAGNOSIS — F4310 Post-traumatic stress disorder, unspecified: Secondary | ICD-10-CM
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
## 2022-09-01 ENCOUNTER — Ambulatory Visit (INDEPENDENT_AMBULATORY_CARE_PROVIDER_SITE_OTHER): Payer: BC Managed Care – PPO | Admitting: Medical-Surgical

## 2022-09-01 ENCOUNTER — Encounter: Payer: Self-pay | Admitting: Medical-Surgical

## 2022-09-01 VITALS — BP 100/64 | HR 63 | Resp 20 | Ht 62.0 in | Wt 154.4 lb

## 2022-09-01 DIAGNOSIS — E063 Autoimmune thyroiditis: Secondary | ICD-10-CM | POA: Diagnosis not present

## 2022-09-01 DIAGNOSIS — F5101 Primary insomnia: Secondary | ICD-10-CM

## 2022-09-01 DIAGNOSIS — G43109 Migraine with aura, not intractable, without status migrainosus: Secondary | ICD-10-CM

## 2022-09-01 DIAGNOSIS — Z131 Encounter for screening for diabetes mellitus: Secondary | ICD-10-CM | POA: Diagnosis not present

## 2022-09-01 DIAGNOSIS — Z1211 Encounter for screening for malignant neoplasm of colon: Secondary | ICD-10-CM | POA: Diagnosis not present

## 2022-09-01 DIAGNOSIS — E038 Other specified hypothyroidism: Secondary | ICD-10-CM

## 2022-09-01 DIAGNOSIS — I472 Ventricular tachycardia, unspecified: Secondary | ICD-10-CM | POA: Insufficient documentation

## 2022-09-01 DIAGNOSIS — J0100 Acute maxillary sinusitis, unspecified: Secondary | ICD-10-CM

## 2022-09-01 DIAGNOSIS — Z0000 Encounter for general adult medical examination without abnormal findings: Secondary | ICD-10-CM

## 2022-09-01 MED ORDER — FLUCONAZOLE 150 MG PO TABS: 150.0000 mg | ORAL_TABLET | Freq: Once | ORAL | 0 refills | Status: AC

## 2022-09-01 MED ORDER — SUMATRIPTAN SUCCINATE 50 MG PO TABS: 50.0000 mg | ORAL_TABLET | Freq: Every day | ORAL | 2 refills | Status: DC | PRN

## 2022-09-01 MED ORDER — NORTRIPTYLINE HCL 25 MG PO CAPS: 25.0000 mg | ORAL_CAPSULE | Freq: Every day | ORAL | 1 refills | Status: DC

## 2022-09-01 MED ORDER — AZITHROMYCIN 250 MG PO TABS: ORAL_TABLET | ORAL | 0 refills | Status: AC

## 2022-09-01 NOTE — Progress Notes (Signed)
Complete physical exam  Patient: Kara Cooper   DOB: Apr 11, 1966   57 y.o. Female  MRN: [id number]  Subjective:    Chief Complaint  Patient presents with   Annual Exam    Kara Cooper is a 57 y.o. female who presents today for a complete physical exam. She reports consuming a  low carb, high protein  diet. Home exercise routine includes cardio and strength training every day. She generally feels well. She reports sleeping poorly. She does not have additional problems to discuss today.    Most recent fall risk assessment:    09/01/2022    3:33 PM  Fall Risk   Falls in the past year? 0  Number falls in past yr: 0  Injury with Fall? 0  Risk for fall due to : No Fall Risks  Follow up Falls evaluation completed     Most recent depression screenings:    09/01/2022    3:33 PM 02/11/2022   10:31 AM  PHQ 2/9 Scores  PHQ - 2 Score 1 2  PHQ- 9 Score  12    Vision:Within last year, Dental: No current dental problems and Receives regular dental care, and STD: The patient reports a past history of: herpes    Patient Care Team: Christen Butter, NP as PCP - General (Nurse Practitioner) Nahser, Deloris Ping, MD as PCP - Cardiology (Cardiology)   Outpatient Medications Prior to Visit  Medication Sig   acetaminophen (TYLENOL) 500 MG tablet Take OTC as directed   albuterol (VENTOLIN HFA) 108 (90 Base) MCG/ACT inhaler 2 PUFFS EVERY 6 HOURS AS NEEDED SOB   APPLE CIDER VINEGAR-GINGER PO Take 2 capsules by mouth in the morning and at bedtime.   buPROPion (WELLBUTRIN XL) 300 MG 24 hr tablet TAKE ONE TABLET BY MOUTH EVERY DAY   cetirizine (ZYRTEC) 10 MG tablet Take 10 mg by mouth daily.   estradiol (ESTRACE) 0.1 MG/GM vaginal cream Place vaginally.   FLUoxetine (PROZAC) 20 MG capsule Take 1 capsule (20 mg total) by mouth daily. START AFTER FINISHING 1 WEEK OF 10MG  DAILY.   fluticasone (FLONASE) 50 MCG/ACT nasal spray USE 2 SPRAYS IN EACH NOSTRIL DAILY AS NEEDED   hydrOXYzine (ATARAX) 50 MG  tablet Take 0.5-1 tablets (25-50 mg total) by mouth at bedtime and may repeat dose one time if needed.   Ibuprofen 200 MG CAPS    levothyroxine (SYNTHROID) 88 MCG tablet Take 1 tablet (88 mcg total) by mouth daily before breakfast.   metoprolol succinate (TOPROL XL) 25 MG 24 hr tablet Take 1 tablet (25 mg total) by mouth daily.   Multiple Vitamin (MULTIVITAMIN) tablet Take 1 tablet by mouth daily.     omeprazole (PRILOSEC) 20 MG capsule Take 20 mg by mouth daily.   propranolol (INDERAL) 10 MG tablet Take 1 tablet (10 mg total) by mouth 4 (four) times daily as needed (SVT).   valACYclovir (VALTREX) 500 MG tablet Take 1 tablet (500 mg total) by mouth daily.   [DISCONTINUED] amitriptyline (ELAVIL) 25 MG tablet TAKE 1 TABLET BY MOUTH EVERYDAY AT BEDTIME   [DISCONTINUED] SUMAtriptan (IMITREX) 50 MG tablet Take 1 tablet (50 mg total) by mouth daily as needed for migraine. Take one by mouth times one as needed for migraine- max one pill in a day.   No facility-administered medications prior to visit.    Review of Systems  Constitutional:  Negative for chills, fever, malaise/fatigue and weight loss.  HENT:  Positive for congestion, ear pain, sore throat and tinnitus. Negative  for hearing loss and sinus pain.   Eyes:  Negative for blurred vision, photophobia and pain.  Respiratory:  Positive for cough. Negative for shortness of breath and wheezing.   Cardiovascular:  Negative for chest pain, palpitations and leg swelling.  Gastrointestinal:  Negative for abdominal pain, constipation, diarrhea, heartburn, nausea and vomiting.  Genitourinary:  Negative for dysuria, frequency and urgency.  Musculoskeletal:  Negative for falls and neck pain.  Skin:  Negative for itching and rash.  Neurological:  Negative for dizziness, weakness and headaches.  Endo/Heme/Allergies:  Negative for polydipsia. Does not bruise/bleed easily.  Psychiatric/Behavioral:  Positive for depression. Negative for substance abuse and  suicidal ideas. The patient is nervous/anxious and has insomnia.      Objective:    BP 100/64 (BP Location: Left Arm, Cuff Size: Normal)   Pulse 63   Resp 20   Ht 5\' 2"  (1.575 m)   Wt 154 lb 6.4 oz (70 kg)   LMP 05/03/2017 (Approximate)   SpO2 98%   BMI 28.24 kg/m    Physical Exam Vitals reviewed.  Constitutional:      General: She is not in acute distress.    Appearance: Normal appearance. She is not ill-appearing.  HENT:     Head: Normocephalic and atraumatic.     Right Ear: Ear canal and external ear normal. A middle ear effusion is present. There is no impacted cerumen.     Left Ear: Ear canal and external ear normal. A middle ear effusion is present. There is no impacted cerumen.     Nose: No congestion or rhinorrhea.     Right Sinus: Maxillary sinus tenderness present.     Left Sinus: Maxillary sinus tenderness present.     Mouth/Throat:     Mouth: Mucous membranes are moist.     Pharynx: No oropharyngeal exudate or posterior oropharyngeal erythema.  Eyes:     General: No scleral icterus.       Right eye: No discharge.        Left eye: No discharge.     Extraocular Movements: Extraocular movements intact.     Conjunctiva/sclera: Conjunctivae normal.     Pupils: Pupils are equal, round, and reactive to light.  Neck:     Thyroid: No thyromegaly.     Vascular: No carotid bruit or JVD.     Trachea: Trachea normal.  Cardiovascular:     Rate and Rhythm: Normal rate and regular rhythm.     Pulses: Normal pulses.     Heart sounds: Normal heart sounds. No murmur heard.    No friction rub. No gallop.  Pulmonary:     Effort: Pulmonary effort is normal. No respiratory distress.     Breath sounds: Normal breath sounds. No wheezing.  Abdominal:     General: Bowel sounds are normal. There is no distension.     Palpations: Abdomen is soft.     Tenderness: There is no abdominal tenderness. There is no guarding.  Musculoskeletal:        General: Normal range of motion.      Cervical back: Normal range of motion and neck supple.  Lymphadenopathy:     Cervical: No cervical adenopathy.  Skin:    General: Skin is warm and dry.  Neurological:     Mental Status: She is alert and oriented to person, place, and time.     Cranial Nerves: No cranial nerve deficit.  Psychiatric:        Mood and Affect: Mood normal.  Behavior: Behavior normal.        Thought Content: Thought content normal.        Judgment: Judgment normal.   No results found for any visits on 09/01/22.     Assessment & Plan:    Routine Health Maintenance and Physical Exam  Immunization History  Administered Date(s) Administered   Influenza,inj,Quad PF,6+ Mos 12/01/2017, 01/06/2021   Influenza-Unspecified 01/20/2017, 01/01/2019, 02/29/2020, 01/16/2022, 01/28/2022   PFIZER(Purple Top)SARS-COV-2 Vaccination 05/07/2019, 05/28/2019, 12/21/2019, 08/19/2020   Zoster Recombinat (Shingrix) 01/06/2021, 03/08/2021   Zoster, Live 04/18/2021    Health Maintenance  Topic Date Due   DTaP/Tdap/Td (1 - Tdap) Never done   COLONOSCOPY (Pts 45-68yrs Insurance coverage will need to be confirmed)  Never done   PAP SMEAR-Modifier  10/06/2022   COVID-19 Vaccine (5 - 2023-24 season) 09/17/2022 (Originally 12/17/2021)   MAMMOGRAM  09/01/2023 (Originally 12/15/2018)   INFLUENZA VACCINE  11/17/2022   Hepatitis C Screening  Completed   HIV Screening  Completed   Zoster Vaccines- Shingrix  Completed   HPV VACCINES  Aged Out    Discussed health benefits of physical activity, and encouraged her to engage in regular exercise appropriate for her age and condition.  1. Annual physical exam Checking labs as below.  Up-to-date on preventative care.  Wellness information provided with AVS. - Lipid panel - COMPLETE METABOLIC PANEL WITH GFR - CBC with Differential/Platelet  2. Diabetes mellitus screening Checking hemoglobin A1c. - Hemoglobin A1c  3. Hypothyroidism due to Hashimoto's thyroiditis Checking  TSH. - TSH  4. Colon cancer screening Referring to GI for colonoscopy. - Ambulatory referral to Gastroenterology  5. Acute non-recurrent maxillary sinusitis She has had approximately 1 week of symptoms presents with maxillary sinus tenderness and bilateral ear effusions/ear pain, we will go ahead and treat with azithromycin.  Adding Diflucan for VVC prophylaxis.  6. Primary insomnia Take amitriptyline 25 mg nightly which seems to help with insomnia as well as migraine prevention.  Unfortunately, she seems to have very vivid nightmares when taking the medication.  Switching to nortriptyline 25 mg nightly to see if this continues to help with sleep and migraines without nightmares.  7. Migraine with aura and without status migrainosus, not intractable Switching from amitriptyline to nortriptyline as above.  Refilling Imitrex for as needed use.   Return in about 1 year (around 09/01/2023) for annual physical exam.   Christen Butter, NP

## 2022-09-02 LAB — COMPLETE METABOLIC PANEL WITH GFR
AG Ratio: 1.9 (calc) (ref 1.0–2.5)
ALT: 12 U/L (ref 6–29)
AST: 16 U/L (ref 10–35)
Albumin: 4.7 g/dL (ref 3.6–5.1)
Alkaline phosphatase (APISO): 53 U/L (ref 37–153)
BUN: 15 mg/dL (ref 7–25)
CO2: 26 mmol/L (ref 20–32)
Calcium: 9.6 mg/dL (ref 8.6–10.4)
Chloride: 105 mmol/L (ref 98–110)
Creat: 0.89 mg/dL (ref 0.50–1.03)
Globulin: 2.5 g/dL (calc) (ref 1.9–3.7)
Glucose, Bld: 90 mg/dL (ref 65–99)
Potassium: 4.3 mmol/L (ref 3.5–5.3)
Sodium: 140 mmol/L (ref 135–146)
Total Bilirubin: 0.6 mg/dL (ref 0.2–1.2)
Total Protein: 7.2 g/dL (ref 6.1–8.1)
eGFR: 77 mL/min/{1.73_m2} (ref >=60)

## 2022-09-02 LAB — CBC WITH DIFFERENTIAL/PLATELET
Absolute Monocytes: 371 cells/uL (ref 200–950)
Basophils Absolute: 61 cells/uL (ref 0–200)
Basophils Relative: 1.3 %
Eosinophils Absolute: 240 cells/uL (ref 15–500)
Eosinophils Relative: 5.1 %
HCT: 37.2 % (ref 35.0–45.0)
Hemoglobin: 12.7 g/dL (ref 11.7–15.5)
Lymphs Abs: 1556 cells/uL (ref 850–3900)
MCH: 33.2 pg — ABNORMAL HIGH (ref 27.0–33.0)
MCHC: 34.1 g/dL (ref 32.0–36.0)
MCV: 97.4 fL (ref 80.0–100.0)
MPV: 11.4 fL (ref 7.5–12.5)
Monocytes Relative: 7.9 %
Neutro Abs: 2472 cells/uL (ref 1500–7800)
Neutrophils Relative %: 52.6 %
Platelets: 230 10*3/uL (ref 140–400)
RBC: 3.82 10*6/uL (ref 3.80–5.10)
RDW: 11.8 % (ref 11.0–15.0)
Total Lymphocyte: 33.1 %
WBC: 4.7 10*3/uL (ref 3.8–10.8)

## 2022-09-02 LAB — LIPID PANEL
Cholesterol: 237 mg/dL — ABNORMAL HIGH (ref <200)
HDL: 70 mg/dL (ref >=50)
LDL Cholesterol (Calc): 147 mg/dL (calc) — ABNORMAL HIGH
Non-HDL Cholesterol (Calc): 167 mg/dL (calc) — ABNORMAL HIGH (ref <130)
Total CHOL/HDL Ratio: 3.4 (calc) (ref <5.0)
Triglycerides: 94 mg/dL (ref <150)

## 2022-09-02 LAB — TSH: TSH: 2.31 mIU/L

## 2022-09-02 LAB — HEMOGLOBIN A1C
Hgb A1c MFr Bld: 5.2 % of total Hgb (ref <5.7)
Mean Plasma Glucose: 103 mg/dL
eAG (mmol/L): 5.7 mmol/L

## 2022-09-13 ENCOUNTER — Ambulatory Visit (INDEPENDENT_AMBULATORY_CARE_PROVIDER_SITE_OTHER): Payer: BC Managed Care – PPO | Admitting: Professional

## 2022-09-13 ENCOUNTER — Encounter: Payer: Self-pay | Admitting: Professional

## 2022-09-13 DIAGNOSIS — F4310 Post-traumatic stress disorder, unspecified: Secondary | ICD-10-CM | POA: Diagnosis not present

## 2022-09-13 DIAGNOSIS — F411 Generalized anxiety disorder: Secondary | ICD-10-CM

## 2022-09-13 NOTE — Progress Notes (Signed)
Behavioral Health Counselor/Therapist Progress Note  Patient ID: Kara Cooper, MRN: [id number],    Date: 09/13/2022  Time Spent: 36 minutes 5-536pm   Treatment Type: Individual Therapy  Risk Assessment: Danger to Self:  No Self-injurious Behavior: No Danger to Others: No  Subjective: This session was held via video teletherapy. The patient consented to video teletherapy and was located in her home during this session. She is aware it is the responsibility of the patient to secure confidentiality on her end of the session. The provider was in a private home office for the duration of this session.    The patient arrived on time for her Caregility session.  Issues addressed: 1-mood -felt melancholy yesterday but is fine today 2-pt's reflection -pt spent time reflecting on our last session related to [facility] and the triggering -she thinks she might continue 3-social -has no friends -thinks she relationships are time consuming and a lot of work to establish -how to establish 4-EMDR -"I'm afraid of Pandora's box, maybe it just needs to be Schroeder's cat"  Treatment Plan (reviewed on 07/04/2022) Problems Addressed  Anxiety, Childhood Trauma, Grief /Loss Unresolved, Low Self-Esteem  Goals 1. Begin a healthy grieving process around the loss. Objective Verbalize resolution of feelings of guilt and regret associated with the loss. Target Date: 2023-04-29 Frequency: Biweekly  Progress: 50 Modality: individual  Related Interventions Assign the client to make a list of all the regrets associated with actions toward or relationship with the deceased; process the list content toward resolution of these feelings. Objective Decrease unrealistic thoughts, statements, and feelings of being responsible for the loss. Target Date: 2023-04-29 Frequency: Biweekly  Progress: 0 Modality: individual  Related Interventions Use a cognitive therapy approach to identify the client's bias  toward thoughts of personal responsibility for the loss and replace them with factual, reality-based thoughts (or assign "Negative Thoughts Trigger Negative Feelings" in the Adult Psychotherapy Homework Planner by Stephannie Li). Objective Express thoughts and feelings about the deceased that went unexpressed while the deceased was alive. Target Date: 2023-04-29 Frequency: Biweekly  Progress: 80 Modality: individual  Related Interventions Conduct an empty-chair exercise with the client where he/she focuses on expressing to the lost loved one imagined in the chair what he/she never said while that loved one was alive. Assign the client to visit the grave of the lost loved one to "talk to" the deceased and express his/her feelings. Ask the client to write a letter to the lost person describing his/her fond memories and/or painful and regretful memories, and how he/she currently feels life (or assign "Dear : A Letter to a Lost Loved One" in the Adult Psychotherapy Homework Planner by [facility]); process the letter in session. Objective Report decreased time spent each day focusing on the loss. Target Date: 2023-04-29 Frequency: Biweekly  Progress: 75 Modality: individual  Related Interventions Suggest that the client set aside a specific time-limited period each day to focus on mourning his/her loss. After each day's time is up, the client will resume regular activities and postpone grieving thoughts until the next scheduled time. For example, mourning times could include putting on dark clothing and/or sad music; clothing would be changed when the allotted time is up. Objective Tell in detail the story of the current loss that is triggering symptoms. Target Date: 2023-04-29 Frequency: Biweekly  Progress: 100 Modality: individual  Related Interventions Use empathy, compassion, and support, allowing the client to tell in detail the story of his/her recent loss. Ask the client to elaborate in an  autobiography the circumstances, feelings, and effects of the losses in him/her; assess the characteristics of the loss (e.g., type, suddenness, trauma), previous functioning, current functioning, and coping style. Objective Identify what stages of grief have been experienced in the continuum of the grieving process. (Marriage, Erica's diabetes-she has always been brittle) Target Date: 2023-04-29 Frequency: Biweekly  Progress: 50 Modality: individual  Related Interventions Educate the client on the stages of the grieving process and answer any questions he/she may have. Assist the client in identifying the stages of grief that he/she has experienced and which stage he/she is presently working through. 2. Demonstrate improved self-esteem through more pride in appearance, more assertiveness, greater eye contact, and identification of positive traits in self-talk messages. 3. Develop a consistent, positive self-image. 4. Develop an awareness of how childhood issues have affected and continue to affect one's family life. Objective Describe what it was like to grow up in the home environment. Target Date: 2023-04-29 Frequency: Biweekly  Progress: 0 Modality: individual  Related Interventions Develop the client's family genogram and/or symptom line and help identify patterns of dysfunction within the family. Objective Describe each family member and identify the role each played within the family. Target Date: 2023-04-29 Frequency: Biweekly  Progress: 0 Modality: individual  Related Interventions Assist the client in clarifying his/her role within the family and his/her feelings connected to that role. Objective Identify how own parenting has been influenced by childhood experiences. Target Date: 2023-04-29 Frequency: Biweekly  Progress: 0 Modality: individual  Related Interventions Ask the client to compare his/her parenting behavior to that of parent figures of his/her childhood; encourage  the client to be aware of how easily we repeat patterns that we grew up with. Objective Decrease feelings of shame by being able to verbally affirm self as not responsible for abuse. Target Date: 2023-04-29 Frequency: Biweekly  Progress: 0 Modality: individual  Related Interventions Assign writing a letter to mother, father, or other abuser in which the client expresses his/her feelings regarding the abuse. Guide the client in an empty chair exercise with a key figure connected to the abuse (i.e., perpetrator, sibling, or parent); reinforce the client for placing responsibility for the abuse or neglect on the caretaker. Consistently reiterate that responsibility for the abuse falls on the abusive adults, not the surviving child (for deserving the abuse), and reinforce statements that accurately reflect placing blame on perpetrators and on nonprotective, nonnurturant adults. Objective Decrease statements of being a victim while increasing statements that reflect personal empowerment. Target Date: 2023-04-29 Frequency: Biweekly  Progress: 0 Modality: individual  Related Interventions Ask the client to complete an exercise that identifies the positives and negatives of being a victim and the positives and negatives of being a survivor; compare and process the lists. Encourage and reinforce the client's statements that reflect movement away from viewing self as a victim and toward personal empowerment as a survivor (or assign "Changing from Victim to Survivor" in the Adult Psychotherapy Homework Planner by Stephannie Li). Objective Identify patterns of abuse, neglect, or abandonment within the family of origin, both current and historical, nuclear and extended. Target Date: 2023-04-29 Frequency: Biweekly  Progress: 0 Modality: individual  Related Interventions Explore the client's painful childhood experiences (or assign "Share the Painful Memory" in the Adult Psychotherapy Homework Planner by  Stephannie Li). 5. Elevate self-esteem. Objective Acknowledge feeling less competent than most others. Target Date: 2023-04-29 Frequency: Biweekly  Progress: 0 Modality: individual  Related Interventions Explore the client's assessment of himself/herself and what is verbalized as the basis for  negative self-perception. Objective Increase insight into the historical and current sources of low self-esteem. Target Date: 2023-04-29 Frequency: Biweekly  Progress: 0 Modality: individual  Related Interventions Discuss, emphasize, and interpret the client's incidents of abuse (emotional, physical, and sexual) and how they have impacted his/her feelings about himself/herself. Help the client become aware of his/her fear of rejection and its connection with past rejection or abandonment experiences; begin to contrast past experiences of pain with present experiences of acceptance and competence. Objective Decrease the frequency of negative self-descriptive statements and increase frequency of positive self-descriptive statements. Target Date: 2023-04-29 Frequency: Biweekly  Progress: 0 Modality: individual  Related Interventions Assist the client in becoming aware of how he/she expresses or acts out negative feelings about himself/herself. Help the client reframe his/her negative assessment of himself/herself. Assist the client in developing positive self-talk as a way of boosting his/her confidence and self-image (or assign "Positive Self-Talk" in the Adult Psychotherapy Homework Planner by Stephannie Li). Objective Identify and replace negative self-talk messages used to reinforce low self-esteem. Target Date: 2023-04-29 Frequency: Biweekly  Progress: 0 Modality: individual  Related Interventions Help the client identify his/her distorted, negative beliefs about self and the world and replace these messages with more realistic, affirmative messages (or assign "Journal and Replace Self-Defeating Thoughts" in  the Adult Psychotherapy Homework Planner by [facility] or read What to Say When You Talk to Yourself by Helmstetter). Objective Identify and engage in activities that would improve self-image by being consistent with one's values. Target Date: 2023-04-29 Frequency: Biweekly  Progress: 0 Modality: individual  Related Interventions Help the client analyze his/her values and the congruence or incongruence between them and the client's daily activities. Identify and assign activities congruent with the client's values; process them toward improving self-concept and self-esteem. Objective Take verbal responsibility for accomplishments without discounting. Target Date: 2023-04-29 Frequency: Biweekly  Progress: 0 Modality: individual  Related Interventions Ask the client to list accomplishments; process the integration of these into his/her self-image. Objective Identify any secondary gain that is received by speaking negatively about self and refusing to take any risks. Target Date: 2023-04-29 Frequency: Biweekly  Progress: 0 Modality: individual  Related Interventions Teach the client the meaning and power of secondary gain in maintaining negative behavior patterns. Assist the client in identifying how self-disparagement and avoidance of risk-taking could bring secondary gain (e.g., praise from others, others taking over responsibilities). 6. Enhance ability to effectively cope with the full variety of life's worries and anxieties. 7. Establish an inward sense of self-worth, confidence, and competence. 8. Learn and implement coping skills that result in a reduction of anxiety and worry, and improved daily functioning. 9. Let go of blame and begin to forgive others for pain caused in childhood. 10. Reduce overall frequency, intensity, and duration of the anxiety so that daily functioning is not impaired. Objective Describe situations, thoughts, feelings, and actions associated with anxieties and  worries, their impact on functioning, and attempts to resolve them. Target Date: 2023-04-29 Frequency: Biweekly  Progress: 0 Modality: individual  Related Interventions Ask the client to describe his/her past experiences of anxiety and their impact on functioning; assess the focus, excessiveness, and uncontrollability of the worry and the type, frequency, intensity, and duration of his/her anxiety symptoms (consider using a structured interview such as The Anxiety Disorders Interview Schedule-Adult Version). Objective Verbalize an understanding of the cognitive, physiological, and behavioral components of anxiety and its treatment. Target Date: 2023-04-29 Frequency: Biweekly  Progress: 0 Modality: individual  Related Interventions Discuss how generalized anxiety typically  involves excessive worry about unrealistic threats, various bodily expressions of tension, overarousal, and hypervigilance, and avoidance of what is threatening that interact to maintain the problem (see Mastery of Your Anxiety and Worry: Therapist Guide by Renard Matter, and Barlow; Treating Generalized Anxiety Disorder by Rygh and Ida Rogue). Objective Learn and implement calming skills to reduce overall anxiety and manage anxiety symptoms. Target Date: 2023-04-29 Frequency: Biweekly  Progress: 0 Modality: individual  Related Interventions Teach the client calming/relaxation skills (e.g., applied relaxation, progressive muscle relaxation, cue controlled relaxation; mindful breathing; biofeedback) and how to discriminate better between relaxation and tension; teach the client how to apply these skills to his/her daily life (e.g., New Directions in Progressive Muscle Relaxation by Marcelyn Ditty, and Hazlett-Stevens; Treating Generalized Anxiety Disorder by Rygh and Ida Rogue). Assign the client homework each session in which he/she practices relaxation exercises daily, gradually applying them progressively from  non-anxiety-provoking to anxiety-provoking situations; review and reinforce success while providing corrective feedback toward improvement. Assign the client to read about progressive muscle relaxation and other calming strategies in relevant books or treatment manuals (e.g., Progressive Relaxation Training by Robb Matar and Alen Blew; Mastery of Your Anxiety and Worry: Workbook by Earlie Counts). Objective Learn and implement a strategy to limit the association between various environmental settings and worry, delaying the worry until a designated "worry time." Target Date: 2023-04-29 Frequency: Biweekly  Progress: 0 Modality: individual  Related Interventions Explain the rationale for using a worry time as well as how it is to be used; agree upon and implement a worry time with the client. Teach the client how to recognize, stop, and postpone worry to the agreed upon worry time using skills such as thought stopping, relaxation, and redirecting attention (or assign "Making Use of the Thought-Stopping Technique" and/or "Worry Time" in the Adult Psychotherapy Homework Planner by Jongsma to assist skill development); encourage use in daily life; review and reinforce success while providing corrective feedback toward improvement. Objective Verbalize an understanding of the role that cognitive biases play in excessive irrational worry and persistent anxiety symptoms. Target Date: 2023-04-29 Frequency: Biweekly  Progress: 0 Modality: individual  Related Interventions Assist the client in analyzing his/her worries by examining potential biases such as the probability of the negative expectation occurring, the real consequences of it occurring, his/her ability to control the outcome, the worst possible outcome, and his/her ability to accept it (see "Analyze the Probability of a Feared Event" in the Adult Psychotherapy Homework Planner by Stephannie Li; Cognitive Therapy of Anxiety Disorders by Laurence Slate). Objective Identify, challenge, and replace biased, fearful self-talk with positive, realistic, and empowering self-talk. Target Date: 2023-04-29 Frequency: Biweekly  Progress: 0 Modality: individual  Related Interventions Explore the client's schema and self-talk that mediate his/her fear response; assist him/her in challenging the biases; replace the distorted messages with reality-based alternatives and positive, realistic self-talk that will increase his/her self-confidence in coping with irrational fears (see Cognitive Therapy of Anxiety Disorders by Laurence Slate). Assign the client a homework exercise in which he/she identifies fearful self-talk, identifies biases in the self-talk, generates alternatives, and tests through behavioral experiments (or assign "Negative Thoughts Trigger Negative Feelings" in the Adult Psychotherapy Homework Planner by [facility]); review and reinforce success, providing corrective feedback toward improvement. Objective Learn and implement personal and interpersonal skills to reduce anxiety and improve interpersonal relationships. Target Date: 2023-04-29 Frequency: Biweekly  Progress: 0 Modality: individual  Related Interventions Use instruction, modeling, and role-playing to build the client's general social, communication, and/or conflict resolution skills. Objective  Learn to accept limitations in life and commit to tolerating, rather than avoiding, unpleasant emotions while accomplishing meaningful goals. Target Date: 2023-04-29 Frequency: Biweekly  Progress: 0 Modality: individual  Related Interventions Use techniques from Acceptance and Commitment Therapy to help client accept uncomfortable realities such as lack of complete control, imperfections, and uncertainty and tolerate unpleasant emotions and thoughts in order to accomplish value-consistent goals. 11. Release the emotions associated with past childhood/family issues, resulting in less  resentment and more serenity. 12. Resolve past childhood/family issues, leading to less anger and depression, greater self-esteem, security, and confidence. 13. Resolve the core conflict that is the source of anxiety. 14. Stabilize anxiety level while increasing ability to function on a daily basis.  Diagnosis:Generalized anxiety disorder  PTSD (post-traumatic stress disorder)  Dx by history upon death of father  Plan:  -pt will work on how to say hello and have some intention about how to start a conversation -meet again on Monday, October 03, 2022 at 4pm.

## 2022-09-28 ENCOUNTER — Other Ambulatory Visit: Payer: Self-pay | Admitting: Medical-Surgical

## 2022-10-02 ENCOUNTER — Other Ambulatory Visit: Payer: Self-pay | Admitting: Cardiovascular Disease

## 2022-10-03 ENCOUNTER — Ambulatory Visit: Payer: BC Managed Care – PPO | Admitting: Professional

## 2022-10-04 ENCOUNTER — Ambulatory Visit: Payer: BC Managed Care – PPO | Admitting: Family Medicine

## 2022-10-04 ENCOUNTER — Encounter: Payer: Self-pay | Admitting: Family Medicine

## 2022-10-04 VITALS — BP 109/81 | HR 77 | Ht 62.0 in | Wt 154.0 lb

## 2022-10-04 DIAGNOSIS — H93A3 Pulsatile tinnitus, bilateral: Secondary | ICD-10-CM | POA: Diagnosis not present

## 2022-10-04 MED ORDER — MECLIZINE HCL 25 MG PO TABS: 25.0000 mg | ORAL_TABLET | Freq: Three times a day (TID) | ORAL | 0 refills | Status: DC | PRN

## 2022-10-04 NOTE — Progress Notes (Signed)
   Acute Office Visit  Subjective:     Patient ID: Kara Cooper, female    DOB: 1965-12-13, 57 y.o.   MRN: [id number]  Chief Complaint  Patient presents with   Tinnitus    HPI Patient is in today for pulsatile tinnitus. Notes some dizziness as well.   Review of Systems  Constitutional:  Negative for chills and fever.  HENT:  Positive for tinnitus.   Respiratory:  Negative for cough and shortness of breath.   Cardiovascular:  Negative for chest pain.  Neurological:  Negative for headaches.        Objective:    BP 109/81   Pulse 77   Ht 5\' 2"  (1.575 m)   Wt 154 lb (69.9 kg)   LMP 05/03/2017 (Approximate)   SpO2 99%   BMI 28.17 kg/m    Physical Exam Vitals and nursing note reviewed.  Constitutional:      General: She is not in acute distress.    Appearance: Normal appearance.  HENT:     Head: Normocephalic and atraumatic.     Right Ear: Tympanic membrane and external ear normal.     Left Ear: Tympanic membrane and external ear normal.     Ears:     Comments: Some fluid on left ear    Nose: Nose normal.  Eyes:     Conjunctiva/sclera: Conjunctivae normal.  Cardiovascular:     Rate and Rhythm: Normal rate and regular rhythm.  Pulmonary:     Effort: Pulmonary effort is normal.     Breath sounds: Normal breath sounds.  Neurological:     General: No focal deficit present.     Mental Status: She is alert and oriented to person, place, and time.  Psychiatric:        Mood and Affect: Mood normal.        Behavior: Behavior normal.        Thought Content: Thought content normal.        Judgment: Judgment normal.     No results found for any visits on 10/04/22.      Assessment & Plan:   Problem List Items Addressed This Visit       Nervous and Auditory   Pulsatile tinnitus of both ears - Primary    - pt noted dizziness associated with this, have added meclizine to regimen - referred to ENT - fluid seen on ears and recommended flonase as well as daily  allergy medicine      Relevant Medications   meclizine (ANTIVERT) 25 MG tablet   Other Relevant Orders   Ambulatory referral to ENT    Meds ordered this encounter  Medications   meclizine (ANTIVERT) 25 MG tablet    Sig: Take 1 tablet (25 mg total) by mouth 3 (three) times daily as needed for dizziness.    Dispense:  30 tablet    Refill:  0      Charlton Amor, DO

## 2022-10-04 NOTE — Assessment & Plan Note (Signed)
-   pt noted dizziness associated with this, have added meclizine to regimen - referred to ENT - fluid seen on ears and recommended flonase as well as daily allergy medicine

## 2022-10-04 NOTE — Telephone Encounter (Signed)
Forwarding since this was regarding today's appt.

## 2022-10-17 ENCOUNTER — Ambulatory Visit (INDEPENDENT_AMBULATORY_CARE_PROVIDER_SITE_OTHER): Payer: BC Managed Care – PPO | Admitting: Professional

## 2022-10-17 ENCOUNTER — Encounter: Payer: Self-pay | Admitting: Professional

## 2022-10-17 DIAGNOSIS — F411 Generalized anxiety disorder: Secondary | ICD-10-CM | POA: Diagnosis not present

## 2022-10-17 DIAGNOSIS — F4310 Post-traumatic stress disorder, unspecified: Secondary | ICD-10-CM

## 2022-10-17 NOTE — Progress Notes (Signed)
[name] Counselor/Therapist Progress Note  Patient ID: Kara Cooper, MRN: [id number],    Date: 10/17/2022  Time Spent: 43 minutes [id number]   Issues addressed: 1-medication -wanted herself off of her medication and feels much better -Ashwaghanda and Thorn adrenal cortex -feels less anxious and not as cloudy 2-personal -has felt better -went out with a neighbor -pt has issues with her tummy and admits that she thinks about way to much -pt puts herself down for having the extra skin and gets preoccupied -discussed negative vs psoitive thinking -reframed thinking negatively into being proof that she birthed two babies; a battlescar -the pt liked the reframe -pt admits she would not permit her daughters to speak unkindly about their stomachs since both have given birth -challenged pt to be as kind to herself as to her daughters -pt likes to be in control and realizes her belly fat is not something she can control -discussed how pt could take control including going to a nutrition coach, getting a new exercise routine, accepting herself as beautiful no matter what  Treatment Type: Individual Therapy  Risk Assessment: Danger to Self:  No Self-injurious Behavior: No Danger to Others: No  Subjective: This session was held via video teletherapy. The patient consented to video teletherapy and was located in her home during this session. She is aware it is the responsibility of the patient to secure confidentiality on her end of the session. The provider was in a private home office for the duration of this session.    The patient arrived on time for her Caregility session.  Issues addressed: 1- mood -felt melancholy yesterday but is fine today 2-pt's reflection -pt spent time reflecting on our last session related to Kara Cooper and the triggering -she thinks she might continue 3-social -has no friends -thinks she relationships are time consuming and a lot of work  to establish -how to establish 4-EMDR -"I'm afraid of Kara Cooper, maybe it just needs to be Kara Cooper"  Treatment Plan (reviewed on 07/04/2022) Problems Addressed  Anxiety, Childhood Trauma, Grief /Loss Unresolved, Low Self-Esteem  Goals 1. Begin a healthy grieving process around the loss. Objective Verbalize resolution of feelings of guilt and regret associated with the loss. Target Date: 2023-04-29 Frequency: Biweekly  Progress: 50 Modality: individual  Related Interventions Assign the client to make a list of all the regrets associated with actions toward or relationship with the deceased; process the list content toward resolution of these feelings. Objective Decrease unrealistic thoughts, statements, and feelings of being responsible for the loss. Target Date: 2023-04-29 Frequency: Biweekly  Progress: 0 Modality: individual  Related Interventions Use a cognitive therapy approach to identify the client's bias toward thoughts of personal responsibility for the loss and replace them with factual, reality-based thoughts (or assign "Negative Thoughts Trigger Negative Feelings" in the Adult Psychotherapy Homework Planner by Kara Cooper). Objective Express thoughts and feelings about the deceased that went unexpressed while the deceased was alive. Target Date: 2023-04-29 Frequency: Biweekly  Progress: 80 Modality: individual  Related Interventions Conduct an empty-chair exercise with the client where he/she focuses on expressing to the lost loved one imagined in the chair what he/she never said while that loved one was alive. Assign the client to visit the grave of the lost loved one to "talk to" the deceased and express his/her feelings. Ask the client to write a letter to the lost person describing his/her fond memories and/or painful and regretful memories, and how he/she currently feels life (or assign "Dear : Kara Cooper  Letter to a Lost Loved One" in the Adult Psychotherapy Homework  Planner by [facility]); process the letter in session. Objective Report decreased time spent each day focusing on the loss. Target Date: 2023-04-29 Frequency: Biweekly  Progress: 75 Modality: individual  Related Interventions Suggest that the client set aside a specific time-limited period each day to focus on mourning his/her loss. After each day's time is up, the client will resume regular activities and postpone grieving thoughts until the next scheduled time. For example, mourning times could include putting on dark clothing and/or sad music; clothing would be changed when the allotted time is up. Objective Tell in detail the story of the current loss that is triggering symptoms. Target Date: 2023-04-29 Frequency: Biweekly  Progress: 100 Modality: individual  Related Interventions Use empathy, compassion, and support, allowing the client to tell in detail the story of his/her recent loss. Ask the client to elaborate in an autobiography the circumstances, feelings, and effects of the losses in him/her; assess the characteristics of the loss (e.g., type, suddenness, trauma), previous functioning, current functioning, and coping style. Objective Identify what stages of grief have been experienced in the continuum of the grieving process. (Marriage, Erica's diabetes-she has always been brittle) Target Date: 2023-04-29 Frequency: Biweekly  Progress: 50 Modality: individual  Related Interventions Educate the client on the stages of the grieving process and answer any questions he/she may have. Assist the client in identifying the stages of grief that he/she has experienced and which stage he/she is presently working through. 2. Demonstrate improved self-esteem through more pride in appearance, more assertiveness, greater eye contact, and identification of positive traits in self-talk messages. 3. Develop a consistent, positive self-image. 4. Develop an awareness of how childhood issues have  affected and continue to affect one's family life. Objective Describe what it was like to grow up in the home environment. Target Date: 2023-04-29 Frequency: Biweekly  Progress: 0 Modality: individual  Related Interventions Develop the client's family genogram and/or symptom line and help identify patterns of dysfunction within the family. Objective Describe each family member and identify the role each played within the family. Target Date: 2023-04-29 Frequency: Biweekly  Progress: 0 Modality: individual  Related Interventions Assist the client in clarifying his/her role within the family and his/her feelings connected to that role. Objective Identify how own parenting has been influenced by childhood experiences. Target Date: 2023-04-29 Frequency: Biweekly  Progress: 0 Modality: individual  Related Interventions Ask the client to compare his/her parenting behavior to that of parent figures of his/her childhood; encourage the client to be aware of how easily we repeat patterns that we grew up with. Objective Decrease feelings of shame by being able to verbally affirm self as not responsible for abuse. Target Date: 2023-04-29 Frequency: Biweekly  Progress: 0 Modality: individual  Related Interventions Assign writing a letter to mother, father, or other abuser in which the client expresses his/her feelings regarding the abuse. Guide the client in an empty chair exercise with a key figure connected to the abuse (i.e., perpetrator, sibling, or parent); reinforce the client for placing responsibility for the abuse or neglect on the caretaker. Consistently reiterate that responsibility for the abuse falls on the abusive adults, not the surviving child (for deserving the abuse), and reinforce statements that accurately reflect placing blame on perpetrators and on nonprotective, nonnurturant adults. Objective Decrease statements of being a victim while increasing statements that reflect personal  empowerment. Target Date: 2023-04-29 Frequency: Biweekly  Progress: 0 Modality: individual  Related Interventions Ask the  client to complete an exercise that identifies the positives and negatives of being a victim and the positives and negatives of being a survivor; compare and process the lists. Encourage and reinforce the client's statements that reflect movement away from viewing self as a victim and toward personal empowerment as a survivor (or assign "Changing from Victim to Survivor" in the Adult Psychotherapy Homework Planner by Kara Cooper). Objective Identify patterns of abuse, neglect, or abandonment within the family of origin, both current and historical, nuclear and extended. Target Date: 2023-04-29 Frequency: Biweekly  Progress: 0 Modality: individual  Related Interventions Explore the client's painful childhood experiences (or assign "Share the Painful Memory" in the Adult Psychotherapy Homework Planner by Kara Cooper). 5. Elevate self-esteem. Objective Acknowledge feeling less competent than most others. Target Date: 2023-04-29 Frequency: Biweekly  Progress: 0 Modality: individual  Related Interventions Explore the client's assessment of himself/herself and what is verbalized as the basis for negative self-perception. Objective Increase insight into the historical and current sources of low self-esteem. Target Date: 2023-04-29 Frequency: Biweekly  Progress: 0 Modality: individual  Related Interventions Discuss, emphasize, and interpret the client's incidents of abuse (emotional, physical, and sexual) and how they have impacted his/her feelings about himself/herself. Help the client become aware of his/her fear of rejection and its connection with past rejection or abandonment experiences; begin to contrast past experiences of pain with present experiences of acceptance and competence. Objective Decrease the frequency of negative self-descriptive statements and increase frequency  of positive self-descriptive statements. Target Date: 2023-04-29 Frequency: Biweekly  Progress: 0 Modality: individual  Related Interventions Assist the client in becoming aware of how he/she expresses or acts out negative feelings about himself/herself. Help the client reframe his/her negative assessment of himself/herself. Assist the client in developing positive self-talk as a way of boosting his/her confidence and self-image (or assign "Positive Self-Talk" in the Adult Psychotherapy Homework Planner by Kara Cooper). Objective Identify and replace negative self-talk messages used to reinforce low self-esteem. Target Date: 2023-04-29 Frequency: Biweekly  Progress: 0 Modality: individual  Related Interventions Help the client identify his/her distorted, negative beliefs about self and the world and replace these messages with more realistic, affirmative messages (or assign "Journal and Replace Self-Defeating Thoughts" in the Adult Psychotherapy Homework Planner by [facility] or read What to Say When You Talk to Yourself by [employer]). Objective Identify and engage in activities that would improve self-image by being consistent with one's values. Target Date: 2023-04-29 Frequency: Biweekly  Progress: 0 Modality: individual  Related Interventions Help the client analyze his/her values and the congruence or incongruence between them and the client's daily activities. Identify and assign activities congruent with the client's values; process them toward improving self-concept and self-esteem. Objective Take verbal responsibility for accomplishments without discounting. Target Date: 2023-04-29 Frequency: Biweekly  Progress: 0 Modality: individual  Related Interventions Ask the client to list accomplishments; process the integration of these into his/her self-image. Objective Identify any secondary gain that is received by speaking negatively about self and refusing to take any risks. Target Date:  2023-04-29 Frequency: Biweekly  Progress: 0 Modality: individual  Related Interventions Teach the client the meaning and power of secondary gain in maintaining negative behavior patterns. Assist the client in identifying how self-disparagement and avoidance of risk-taking could bring secondary gain (e.g., praise from others, others taking over responsibilities). 6. Enhance ability to effectively cope with the full variety of life's worries and anxieties. 7. Establish an inward sense of self-worth, confidence, and competence. 8. Learn and implement coping skills that result in  a reduction of anxiety and worry, and improved daily functioning. 9. Let go of blame and begin to forgive others for pain caused in childhood. 10. Reduce overall frequency, intensity, and duration of the anxiety so that daily functioning is not impaired. Objective Describe situations, thoughts, feelings, and actions associated with anxieties and worries, their impact on functioning, and attempts to resolve them. Target Date: 2023-04-29 Frequency: Biweekly  Progress: 0 Modality: individual  Related Interventions Ask the client to describe his/her past experiences of anxiety and their impact on functioning; assess the focus, excessiveness, and uncontrollability of the worry and the type, frequency, intensity, and duration of his/her anxiety symptoms (consider using a structured interview such as The Anxiety Disorders Interview Schedule-Adult Version). Objective Verbalize an understanding of the cognitive, physiological, and behavioral components of anxiety and its treatment. Target Date: 2023-04-29 Frequency: Biweekly  Progress: 0 Modality: individual  Related Interventions Discuss how generalized anxiety typically involves excessive worry about unrealistic threats, various bodily expressions of tension, overarousal, and hypervigilance, and avoidance of what is threatening that interact to maintain the problem (see Mastery of  Your Anxiety and Worry: Therapist Guide by Renard Matter, and Barlow; Treating Generalized Anxiety Disorder by Rygh and Ida Rogue). Objective Learn and implement calming skills to reduce overall anxiety and manage anxiety symptoms. Target Date: 2023-04-29 Frequency: Biweekly  Progress: 0 Modality: individual  Related Interventions Teach the client calming/relaxation skills (e.g., applied relaxation, progressive muscle relaxation, cue controlled relaxation; mindful breathing; biofeedback) and how to discriminate better between relaxation and tension; teach the client how to apply these skills to his/her daily life (e.g., New Directions in Progressive Muscle Relaxation by Marcelyn Ditty, and Hazlett-Stevens; Treating Generalized Anxiety Disorder by Rygh and Ida Rogue). Assign the client homework each session in which he/she practices relaxation exercises daily, gradually applying them progressively from non-anxiety-provoking to anxiety-provoking situations; review and reinforce success while providing corrective feedback toward improvement. Assign the client to read about progressive muscle relaxation and other calming strategies in relevant books or treatment manuals (e.g., Progressive Relaxation Training by Robb Matar and Alen Blew; Mastery of Your Anxiety and Worry: Workbook by Earlie Counts). Objective Learn and implement a strategy to limit the association between various environmental settings and worry, delaying the worry until a designated "worry time." Target Date: 2023-04-29 Frequency: Biweekly  Progress: 0 Modality: individual  Related Interventions Explain the rationale for using a worry time as well as how it is to be used; agree upon and implement a worry time with the client. Teach the client how to recognize, stop, and postpone worry to the agreed upon worry time using skills such as thought stopping, relaxation, and redirecting attention (or assign "Making Use of the  Thought-Stopping Technique" and/or "Worry Time" in the Adult Psychotherapy Homework Planner by Jongsma to assist skill development); encourage use in daily life; review and reinforce success while providing corrective feedback toward improvement. Objective Verbalize an understanding of the role that cognitive biases play in excessive irrational worry and persistent anxiety symptoms. Target Date: 2023-04-29 Frequency: Biweekly  Progress: 0 Modality: individual  Related Interventions Assist the client in analyzing his/her worries by examining potential biases such as the probability of the negative expectation occurring, the real consequences of it occurring, his/her ability to control the outcome, the worst possible outcome, and his/her ability to accept it (see "Analyze the Probability of a Feared Event" in the Adult Psychotherapy Homework Planner by Kara Cooper; Cognitive Therapy of Anxiety Disorders by Laurence Slate). Objective Identify, challenge, and replace biased, fearful self-talk with positive,  realistic, and empowering self-talk. Target Date: 2023-04-29 Frequency: Biweekly  Progress: 0 Modality: individual  Related Interventions Explore the client's schema and self-talk that mediate his/her fear response; assist him/her in challenging the biases; replace the distorted messages with reality-based alternatives and positive, realistic self-talk that will increase his/her self-confidence in coping with irrational fears (see Cognitive Therapy of Anxiety Disorders by Laurence Slate). Assign the client a homework exercise in which he/she identifies fearful self-talk, identifies biases in the self-talk, generates alternatives, and tests through behavioral experiments (or assign "Negative Thoughts Trigger Negative Feelings" in the Adult Psychotherapy Homework Planner by [facility]); review and reinforce success, providing corrective feedback toward improvement. Objective Learn and implement personal and  interpersonal skills to reduce anxiety and improve interpersonal relationships. Target Date: 2023-04-29 Frequency: Biweekly  Progress: 0 Modality: individual  Related Interventions Use instruction, modeling, and role-playing to build the client's general social, communication, and/or conflict resolution skills. Objective Learn to accept limitations in life and commit to tolerating, rather than avoiding, unpleasant emotions while accomplishing meaningful goals. Target Date: 2023-04-29 Frequency: Biweekly  Progress: 0 Modality: individual  Related Interventions Use techniques from Acceptance and Commitment Therapy to help client accept uncomfortable realities such as lack of complete control, imperfections, and uncertainty and tolerate unpleasant emotions and thoughts in order to accomplish value-consistent goals. 11. Release the emotions associated with past childhood/family issues, resulting in less resentment and more serenity. 12. Resolve past childhood/family issues, leading to less anger and depression, greater self-esteem, security, and confidence. 13. Resolve the core conflict that is the source of anxiety. 14. Stabilize anxiety level while increasing ability to function on a daily basis.  Diagnosis:No diagnosis found.  Dx by history upon death of father  Plan:  -pt will work on how to say hello and have some intention about how to start a conversation -meet again on Monday, October 03, 2022 at 4pm.

## 2022-11-04 DIAGNOSIS — H10413 Chronic giant papillary conjunctivitis, bilateral: Secondary | ICD-10-CM | POA: Diagnosis not present

## 2022-11-04 DIAGNOSIS — H18212 Corneal edema secondary to contact lens, left eye: Secondary | ICD-10-CM | POA: Diagnosis not present

## 2022-11-14 ENCOUNTER — Other Ambulatory Visit: Payer: Self-pay | Admitting: Medical-Surgical

## 2022-11-14 ENCOUNTER — Ambulatory Visit: Payer: BC Managed Care – PPO | Admitting: Professional

## 2022-11-14 DIAGNOSIS — E038 Other specified hypothyroidism: Secondary | ICD-10-CM

## 2022-11-23 ENCOUNTER — Encounter: Payer: Self-pay | Admitting: Medical-Surgical

## 2022-11-25 ENCOUNTER — Other Ambulatory Visit: Payer: Self-pay | Admitting: Medical-Surgical

## 2022-11-25 DIAGNOSIS — R768 Other specified abnormal immunological findings in serum: Secondary | ICD-10-CM

## 2022-11-29 ENCOUNTER — Ambulatory Visit (INDEPENDENT_AMBULATORY_CARE_PROVIDER_SITE_OTHER): Payer: BC Managed Care – PPO | Admitting: Professional

## 2022-11-29 ENCOUNTER — Encounter: Payer: Self-pay | Admitting: Professional

## 2022-11-29 DIAGNOSIS — F4310 Post-traumatic stress disorder, unspecified: Secondary | ICD-10-CM | POA: Diagnosis not present

## 2022-11-29 DIAGNOSIS — F411 Generalized anxiety disorder: Secondary | ICD-10-CM

## 2022-11-29 NOTE — Progress Notes (Signed)
[facility] Counselor/Therapist Progress Note  Patient ID: Kara Cooper, MRN: [id number],    Date: 11/29/2022  Time Spent: 49 minutes [id number]   Treatment Type: Individual Therapy  Risk Assessment: Danger to Self:  No Self-injurious Behavior: No Danger to Others: No  Subjective: This session was held via video teletherapy. The patient consented to video teletherapy and was located in her home during this session. She is aware it is the responsibility of the patient to secure confidentiality on her end of the session. The provider was in a private home office for the duration of this session.    The patient arrived on time for her Caregility session.  Issues addressed: 1-homework a-pt will work on how to say hello and have some intention about how to start a conversation 2-vacation with daughters and their families -she left the night before they were to leave -she felt upset and could not describe why -pt reports the routine was is so different when they go stay at the girls father's home -she was sharing her room with Marshall's 101 year old daughter and had no personal space -she did not handle her interaction with Alycia Rossetti very well and was short with her -she learned that they were both overwhelmed too -she would rate her vacation an 8 and if including the last night she would change to a 7. -she went back on the Fluoxetine and d/c'd the Ashwaghanda nad the [place] were for her 3-professional -going well -EMR transition -things have settled down -she has allowed herself time to take a pause and think it through 4-daughter Erica's first baby Jimmey Ralph  -she and Parker's partner Bernette Redbird were not married -she went into a depression and was having problems with her diabetes and having trouble breastfeeding -baby was hospitalized due to failure to thrive -Bernette Redbird made it a big deal and a custody agreement was made for them to follow -he took him in 2019 for Christmas and  never returned him -she was filing for welfare checks and his family threatened to file harrassment charges -she has always had access to his medical records and pt noticed some disturbing content on Facebook and learned that there was an open DSS case -DSS in [facility] has documented many lies -she went before the judge at the [facility] last week and the judge did not deny or approve -the judge admits that something is wrong with the scenario -she talked to the DSS worker in [place] -daughter's PO will testify that she was actively involved when the child was hospitalized -custody papers stated he was not to move 30 minutes outside of the other's presence -Jimmey Ralph is soon to be [age] and was never in his mother's custody  Treatment Plan (reviewed on 07/04/2022) Problems Addressed  Anxiety, Childhood Trauma, Grief /Loss Unresolved, Low Self-Esteem  Goals 1. Begin a healthy grieving process around the loss. Objective Verbalize resolution of feelings of guilt and regret associated with the loss. Target Date: 2023-04-29 Frequency: Biweekly  Progress: 50 Modality: individual  Related Interventions Assign the client to make a list of all the regrets associated with actions toward or relationship with the deceased; process the list content toward resolution of these feelings. Objective Decrease unrealistic thoughts, statements, and feelings of being responsible for the loss. Target Date: 2023-04-29 Frequency: Biweekly  Progress: 0 Modality: individual  Related Interventions Use a cognitive therapy approach to identify the client's bias toward thoughts of personal responsibility for the loss and replace them with factual, reality-based  thoughts (or assign "Negative Thoughts Trigger Negative Feelings" in the Adult Psychotherapy Homework Planner by Stephannie Li). Objective Express thoughts and feelings about the deceased that went unexpressed while the deceased was  alive. Target Date: 2023-04-29 Frequency: Biweekly  Progress: 80 Modality: individual  Related Interventions Conduct an empty-chair exercise with the client where he/she focuses on expressing to the lost loved one imagined in the chair what he/she never said while that loved one was alive. Assign the client to visit the grave of the lost loved one to "talk to" the deceased and express his/her feelings. Ask the client to write a letter to the lost person describing his/her fond memories and/or painful and regretful memories, and how he/she currently feels life (or assign "Dear : A Letter to a Lost Loved One" in the Adult Psychotherapy Homework Planner by [facility]); process the letter in session. Objective Report decreased time spent each day focusing on the loss. Target Date: 2023-04-29 Frequency: Biweekly  Progress: 75 Modality: individual  Related Interventions Suggest that the client set aside a specific time-limited period each day to focus on mourning his/her loss. After each day's time is up, the client will resume regular activities and postpone grieving thoughts until the next scheduled time. For example, mourning times could include putting on dark clothing and/or sad music; clothing would be changed when the allotted time is up. Objective Tell in detail the story of the current loss that is triggering symptoms. Target Date: 2023-04-29 Frequency: Biweekly  Progress: 100 Modality: individual  Related Interventions Use empathy, compassion, and support, allowing the client to tell in detail the story of his/her recent loss. Ask the client to elaborate in an autobiography the circumstances, feelings, and effects of the losses in him/her; assess the characteristics of the loss (e.g., type, suddenness, trauma), previous functioning, current functioning, and coping style. Objective Identify what stages of grief have been experienced in the continuum of the grieving process. (Marriage, Erica's  diabetes-she has always been brittle) Target Date: 2023-04-29 Frequency: Biweekly  Progress: 50 Modality: individual  Related Interventions Educate the client on the stages of the grieving process and answer any questions he/she may have. Assist the client in identifying the stages of grief that he/she has experienced and which stage he/she is presently working through. 2. Demonstrate improved self-esteem through more pride in appearance, more assertiveness, greater eye contact, and identification of positive traits in self-talk messages. 3. Develop a consistent, positive self-image. 4. Develop an awareness of how childhood issues have affected and continue to affect one's family life. Objective Describe what it was like to grow up in the home environment. Target Date: 2023-04-29 Frequency: Biweekly  Progress: 0 Modality: individual  Related Interventions Develop the client's family genogram and/or symptom line and help identify patterns of dysfunction within the family. Objective Describe each family member and identify the role each played within the family. Target Date: 2023-04-29 Frequency: Biweekly  Progress: 0 Modality: individual  Related Interventions Assist the client in clarifying his/her role within the family and his/her feelings connected to that role. Objective Identify how own parenting has been influenced by childhood experiences. Target Date: 2023-04-29 Frequency: Biweekly  Progress: 0 Modality: individual  Related Interventions Ask the client to compare his/her parenting behavior to that of parent figures of his/her childhood; encourage the client to be aware of how easily we repeat patterns that we grew up with. Objective Decrease feelings of shame by being able to verbally affirm self as not responsible for abuse. Target Date: 2023-04-29 Frequency: Biweekly  Progress: 0 Modality: individual  Related Interventions Assign writing a letter to mother, father, or other  abuser in which the client expresses his/her feelings regarding the abuse. Guide the client in an empty chair exercise with a key figure connected to the abuse (i.e., perpetrator, sibling, or parent); reinforce the client for placing responsibility for the abuse or neglect on the caretaker. Consistently reiterate that responsibility for the abuse falls on the abusive adults, not the surviving child (for deserving the abuse), and reinforce statements that accurately reflect placing blame on perpetrators and on nonprotective, nonnurturant adults. Objective Decrease statements of being a victim while increasing statements that reflect personal empowerment. Target Date: 2023-04-29 Frequency: Biweekly  Progress: 0 Modality: individual  Related Interventions Ask the client to complete an exercise that identifies the positives and negatives of being a victim and the positives and negatives of being a survivor; compare and process the lists. Encourage and reinforce the client's statements that reflect movement away from viewing self as a victim and toward personal empowerment as a survivor (or assign "Changing from Victim to Survivor" in the Adult Psychotherapy Homework Planner by Stephannie Li). Objective Identify patterns of abuse, neglect, or abandonment within the family of origin, both current and historical, nuclear and extended. Target Date: 2023-04-29 Frequency: Biweekly  Progress: 0 Modality: individual  Related Interventions Explore the client's painful childhood experiences (or assign "Share the Painful Memory" in the Adult Psychotherapy Homework Planner by Stephannie Li). 5. Elevate self-esteem. Objective Acknowledge feeling less competent than most others. Target Date: 2023-04-29 Frequency: Biweekly  Progress: 0 Modality: individual  Related Interventions Explore the client's assessment of himself/herself and what is verbalized as the basis for negative self-perception. Objective Increase insight  into the historical and current sources of low self-esteem. Target Date: 2023-04-29 Frequency: Biweekly  Progress: 0 Modality: individual  Related Interventions Discuss, emphasize, and interpret the client's incidents of abuse (emotional, physical, and sexual) and how they have impacted his/her feelings about himself/herself. Help the client become aware of his/her fear of rejection and its connection with past rejection or abandonment experiences; begin to contrast past experiences of pain with present experiences of acceptance and competence. Objective Decrease the frequency of negative self-descriptive statements and increase frequency of positive self-descriptive statements. Target Date: 2023-04-29 Frequency: Biweekly  Progress: 0 Modality: individual  Related Interventions Assist the client in becoming aware of how he/she expresses or acts out negative feelings about himself/herself. Help the client reframe his/her negative assessment of himself/herself. Assist the client in developing positive self-talk as a way of boosting his/her confidence and self-image (or assign "Positive Self-Talk" in the Adult Psychotherapy Homework Planner by Stephannie Li). Objective Identify and replace negative self-talk messages used to reinforce low self-esteem. Target Date: 2023-04-29 Frequency: Biweekly  Progress: 0 Modality: individual  Related Interventions Help the client identify his/her distorted, negative beliefs about self and the world and replace these messages with more realistic, affirmative messages (or assign "Journal and Replace Self-Defeating Thoughts" in the Adult Psychotherapy Homework Planner by [facility] or read What to Say When You Talk to Yourself by Helmstetter). Objective Identify and engage in activities that would improve self-image by being consistent with one's values. Target Date: 2023-04-29 Frequency: Biweekly  Progress: 0 Modality: individual  Related Interventions Help the client  analyze his/her values and the congruence or incongruence between them and the client's daily activities. Identify and assign activities congruent with the client's values; process them toward improving self-concept and self-esteem. Objective Take verbal responsibility for accomplishments without discounting. Target Date: 2023-04-29 Frequency:  Biweekly  Progress: 0 Modality: individual  Related Interventions Ask the client to list accomplishments; process the integration of these into his/her self-image. Objective Identify any secondary gain that is received by speaking negatively about self and refusing to take any risks. Target Date: 2023-04-29 Frequency: Biweekly  Progress: 0 Modality: individual  Related Interventions Teach the client the meaning and power of secondary gain in maintaining negative behavior patterns. Assist the client in identifying how self-disparagement and avoidance of risk-taking could bring secondary gain (e.g., praise from others, others taking over responsibilities). 6. Enhance ability to effectively cope with the full variety of life's worries and anxieties. 7. Establish an inward sense of self-worth, confidence, and competence. 8. Learn and implement coping skills that result in a reduction of anxiety and worry, and improved daily functioning. 9. Let go of blame and begin to forgive others for pain caused in childhood. 10. Reduce overall frequency, intensity, and duration of the anxiety so that daily functioning is not impaired. Objective Describe situations, thoughts, feelings, and actions associated with anxieties and worries, their impact on functioning, and attempts to resolve them. Target Date: 2023-04-29 Frequency: Biweekly  Progress: 0 Modality: individual  Related Interventions Ask the client to describe his/her past experiences of anxiety and their impact on functioning; assess the focus, excessiveness, and uncontrollability of the worry and the type,  frequency, intensity, and duration of his/her anxiety symptoms (consider using a structured interview such as The Anxiety Disorders Interview Schedule-Adult Version). Objective Verbalize an understanding of the cognitive, physiological, and behavioral components of anxiety and its treatment. Target Date: 2023-04-29 Frequency: Biweekly  Progress: 0 Modality: individual  Related Interventions Discuss how generalized anxiety typically involves excessive worry about unrealistic threats, various bodily expressions of tension, overarousal, and hypervigilance, and avoidance of what is threatening that interact to maintain the problem (see Mastery of Your Anxiety and Worry: Therapist Guide by Renard Matter, and Barlow; Treating Generalized Anxiety Disorder by Rygh and Ida Rogue). Objective Learn and implement calming skills to reduce overall anxiety and manage anxiety symptoms. Target Date: 2023-04-29 Frequency: Biweekly  Progress: 0 Modality: individual  Related Interventions Teach the client calming/relaxation skills (e.g., applied relaxation, progressive muscle relaxation, cue controlled relaxation; mindful breathing; biofeedback) and how to discriminate better between relaxation and tension; teach the client how to apply these skills to his/her daily life (e.g., New Directions in Progressive Muscle Relaxation by Marcelyn Ditty, and Hazlett-Stevens; Treating Generalized Anxiety Disorder by Rygh and Ida Rogue). Assign the client homework each session in which he/she practices relaxation exercises daily, gradually applying them progressively from non-anxiety-provoking to anxiety-provoking situations; review and reinforce success while providing corrective feedback toward improvement. Assign the client to read about progressive muscle relaxation and other calming strategies in relevant books or treatment manuals (e.g., Progressive Relaxation Training by Robb Matar and Alen Blew; Mastery of Your Anxiety  and Worry: Workbook by Earlie Counts). Objective Learn and implement a strategy to limit the association between various environmental settings and worry, delaying the worry until a designated "worry time." Target Date: 2023-04-29 Frequency: Biweekly  Progress: 0 Modality: individual  Related Interventions Explain the rationale for using a worry time as well as how it is to be used; agree upon and implement a worry time with the client. Teach the client how to recognize, stop, and postpone worry to the agreed upon worry time using skills such as thought stopping, relaxation, and redirecting attention (or assign "Making Use of the Thought-Stopping Technique" and/or "Worry Time" in the Adult Psychotherapy Homework Planner by Jongsma to  assist skill development); encourage use in daily life; review and reinforce success while providing corrective feedback toward improvement. Objective Verbalize an understanding of the role that cognitive biases play in excessive irrational worry and persistent anxiety symptoms. Target Date: 2023-04-29 Frequency: Biweekly  Progress: 0 Modality: individual  Related Interventions Assist the client in analyzing his/her worries by examining potential biases such as the probability of the negative expectation occurring, the real consequences of it occurring, his/her ability to control the outcome, the worst possible outcome, and his/her ability to accept it (see "Analyze the Probability of a Feared Event" in the Adult Psychotherapy Homework Planner by Stephannie Li; Cognitive Therapy of Anxiety Disorders by Laurence Slate). Objective Identify, challenge, and replace biased, fearful self-talk with positive, realistic, and empowering self-talk. Target Date: 2023-04-29 Frequency: Biweekly  Progress: 0 Modality: individual  Related Interventions Explore the client's schema and self-talk that mediate his/her fear response; assist him/her in challenging the biases; replace the  distorted messages with reality-based alternatives and positive, realistic self-talk that will increase his/her self-confidence in coping with irrational fears (see Cognitive Therapy of Anxiety Disorders by Laurence Slate). Assign the client a homework exercise in which he/she identifies fearful self-talk, identifies biases in the self-talk, generates alternatives, and tests through behavioral experiments (or assign "Negative Thoughts Trigger Negative Feelings" in the Adult Psychotherapy Homework Planner by [facility]); review and reinforce success, providing corrective feedback toward improvement. Objective Learn and implement personal and interpersonal skills to reduce anxiety and improve interpersonal relationships. Target Date: 2023-04-29 Frequency: Biweekly  Progress: 0 Modality: individual  Related Interventions Use instruction, modeling, and role-playing to build the client's general social, communication, and/or conflict resolution skills. Objective Learn to accept limitations in life and commit to tolerating, rather than avoiding, unpleasant emotions while accomplishing meaningful goals. Target Date: 2023-04-29 Frequency: Biweekly  Progress: 0 Modality: individual  Related Interventions Use techniques from Acceptance and Commitment Therapy to help client accept uncomfortable realities such as lack of complete control, imperfections, and uncertainty and tolerate unpleasant emotions and thoughts in order to accomplish value-consistent goals. 11. Release the emotions associated with past childhood/family issues, resulting in less resentment and more serenity. 12. Resolve past childhood/family issues, leading to less anger and depression, greater self-esteem, security, and confidence. 13. Resolve the core conflict that is the source of anxiety. 14. Stabilize anxiety level while increasing ability to function on a daily basis.  Diagnosis:Generalized anxiety disorder  PTSD (post-traumatic stress  disorder)  Dx by history upon death of father  Plan:  -meet again on Tuesday, December 13, 2022 at 4pm.

## 2022-11-30 ENCOUNTER — Encounter: Payer: Self-pay | Admitting: Medical-Surgical

## 2022-12-02 DIAGNOSIS — H93A1 Pulsatile tinnitus, right ear: Secondary | ICD-10-CM | POA: Diagnosis not present

## 2022-12-02 DIAGNOSIS — H93293 Other abnormal auditory perceptions, bilateral: Secondary | ICD-10-CM | POA: Diagnosis not present

## 2022-12-13 ENCOUNTER — Ambulatory Visit (INDEPENDENT_AMBULATORY_CARE_PROVIDER_SITE_OTHER): Payer: BC Managed Care – PPO | Admitting: Professional

## 2022-12-13 ENCOUNTER — Encounter: Payer: Self-pay | Admitting: Professional

## 2022-12-13 DIAGNOSIS — F411 Generalized anxiety disorder: Secondary | ICD-10-CM | POA: Diagnosis not present

## 2022-12-13 DIAGNOSIS — F4310 Post-traumatic stress disorder, unspecified: Secondary | ICD-10-CM

## 2022-12-13 NOTE — Progress Notes (Signed)
[facility] Counselor/Therapist Progress Note  Patient ID: Kara Cooper, MRN: [id number],    Date: 12/13/2022  Time Spent: 36 minutes [id number]   Treatment Type: Individual Therapy  Risk Assessment: Danger to Self:  No Self-injurious Behavior: No Danger to Others: No  Subjective: This session was held via video teletherapy. The patient consented to video teletherapy and was located in her home during this session. She is aware it is the responsibility of the patient to secure confidentiality on her end of the session. The provider was in a private home office for the duration of this session.    The patient arrived on time for her Caregility session.  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 Begin a healthy grieving process around the loss. Objective Verbalize resolution of feelings of guilt and regret associated with the loss. Target Date: 2023-04-29 Frequency: Biweekly  Progress: 50 Modality: individual  Related Interventions Assign the client to make a list of all the regrets associated with actions  toward or relationship with the deceased; process the list content toward resolution of these feelings. Objective Decrease unrealistic thoughts, statements, and feelings of being responsible for the loss. Target Date: 2023-04-29 Frequency: Biweekly  Progress: 0 Modality: individual  Related Interventions Use a cognitive therapy approach to identify the client's bias toward thoughts of personal responsibility for the loss and replace them with factual, reality-based thoughts (or assign "Negative Thoughts Trigger Negative Feelings" in the Adult Psychotherapy Homework Planner by Stephannie Li). Objective Express thoughts and feelings about the deceased that went unexpressed while the deceased was alive. Target Date: 2023-04-29 Frequency: Biweekly  Progress: 80 Modality: individual  Related Interventions Conduct an empty-chair exercise with the client where he/she focuses on expressing to the lost loved one imagined in the chair what he/she never said while that loved one was alive. Assign the client to visit the grave of the lost loved one to "talk to" the deceased and express his/her feelings. Ask the client to write a letter to the lost person describing his/her fond memories and/or painful and regretful memories, and how he/she currently feels life (or assign "Dear : A Letter to a Lost Loved One" in the Adult Psychotherapy Homework Planner by [facility]); process the letter in session. Objective Report decreased time spent each day focusing on the loss. Target Date: 2023-04-29 Frequency: Biweekly  Progress: 75 Modality: individual  Related Interventions Suggest that the client set aside a specific time-limited period each day to focus on mourning his/her loss. After each day's time is up, the client will resume regular activities and postpone grieving thoughts until the next scheduled time. For example, mourning times could include putting on  dark clothing and/or sad music; clothing would be changed when  the allotted time is up. Objective Tell in detail the story of the current loss that is triggering symptoms. Target Date: 2023-04-29 Frequency: Biweekly  Progress: 100 Modality: individual  Related Interventions Use empathy, compassion, and support, allowing the client to tell in detail the story of his/her recent loss. Ask the client to elaborate in an autobiography the circumstances, feelings, and effects of the losses in him/her; assess the characteristics of the loss (e.g., type, suddenness, trauma), previous functioning, current functioning, and coping style. Objective Identify what stages of grief have been experienced in the continuum of the grieving process. (Marriage, Erica's diabetes-she has always been brittle) Target Date: 2023-04-29 Frequency: Biweekly  Progress: 50 Modality: individual  Related Interventions Educate the client on the stages of the grieving process and answer any questions he/she may have. Assist the client in identifying the stages of grief that he/she has experienced and which stage he/she is presently working through. 2. Demonstrate improved self-esteem through more pride in appearance, more assertiveness, greater eye contact, and identification of positive traits in self-talk messages. 3. Develop a consistent, positive self-image. 4. Develop an awareness of how childhood issues have affected and continue to affect one's family life. Objective Describe what it was like to grow up in the home environment. Target Date: 2023-04-29 Frequency: Biweekly  Progress: 0 Modality: individual  Related Interventions Develop the client's family genogram and/or symptom line and help identify patterns of dysfunction within the family. Objective Describe each family member and identify the role each played within the family. Target Date: 2023-04-29 Frequency: Biweekly  Progress: 0 Modality: individual  Related Interventions Assist the client in clarifying his/her role  within the family and his/her feelings connected to that role. Objective Identify how own parenting has been influenced by childhood experiences. Target Date: 2023-04-29 Frequency: Biweekly  Progress: 0 Modality: individual  Related Interventions Ask the client to compare his/her parenting behavior to that of parent figures of his/her childhood; encourage the client to be aware of how easily we repeat patterns that we grew up with. Objective Decrease feelings of shame by being able to verbally affirm self as not responsible for abuse. Target Date: 2023-04-29 Frequency: Biweekly  Progress: 0 Modality: individual  Related Interventions Assign writing a letter to mother, father, or other abuser in which the client expresses his/her feelings regarding the abuse. Guide the client in an empty chair exercise with a key figure connected to the abuse (i.e., perpetrator, sibling, or parent); reinforce the client for placing responsibility for the abuse or neglect on the caretaker. Consistently reiterate that responsibility for the abuse falls on the abusive adults, not the surviving child (for deserving the abuse), and reinforce statements that accurately reflect placing blame on perpetrators and on nonprotective, nonnurturant adults. Objective Decrease statements of being a victim while increasing statements that reflect personal empowerment. Target Date: 2023-04-29 Frequency: Biweekly  Progress: 0 Modality: individual  Related Interventions Ask the client to complete an exercise that identifies the positives and negatives of being a victim and the positives and negatives of being a survivor; compare and process the lists. Encourage and reinforce the client's statements that reflect movement away from viewing self as a victim and toward personal empowerment as a survivor (or assign "Changing from Victim to Survivor" in the Adult Psychotherapy Homework Planner by Stephannie Li). Objective Identify patterns of  abuse, neglect, or abandonment within the family of origin, both current and historical, nuclear and extended. Target Date: 2023-04-29  Frequency: Biweekly  Progress: 0 Modality: individual  Related Interventions Explore the client's painful childhood experiences (or assign "Share the Painful Memory" in the Adult Psychotherapy Homework Planner by Stephannie Li). 5. Elevate self-esteem. Objective Acknowledge feeling less competent than most others. Target Date: 2023-04-29 Frequency: Biweekly  Progress: 0 Modality: individual  Related Interventions Explore the client's assessment of himself/herself and what is verbalized as the basis for negative self-perception. Objective Increase insight into the historical and current sources of low self-esteem. Target Date: 2023-04-29 Frequency: Biweekly  Progress: 0 Modality: individual  Related Interventions Discuss, emphasize, and interpret the client's incidents of abuse (emotional, physical, and sexual) and how they have impacted his/her feelings about himself/herself. Help the client become aware of his/her fear of rejection and its connection with past rejection or abandonment experiences; begin to contrast past experiences of pain with present experiences of acceptance and competence. Objective Decrease the frequency of negative self-descriptive statements and increase frequency of positive self-descriptive statements. Target Date: 2023-04-29 Frequency: Biweekly  Progress: 0 Modality: individual  Related Interventions Assist the client in becoming aware of how he/she expresses or acts out negative feelings about himself/herself. Help the client reframe his/her negative assessment of himself/herself. Assist the client in developing positive self-talk as a way of boosting his/her confidence and self-image (or assign "Positive Self-Talk" in the Adult Psychotherapy Homework Planner by Stephannie Li). Objective Identify and replace negative self-talk messages  used to reinforce low self-esteem. Target Date: 2023-04-29 Frequency: Biweekly  Progress: 0 Modality: individual  Related Interventions Help the client identify his/her distorted, negative beliefs about self and the world and replace these messages with more realistic, affirmative messages (or assign "Journal and Replace Self-Defeating Thoughts" in the Adult Psychotherapy Homework Planner by [facility] or read What to Say When You Talk to Yourself by [employer]etter). Objective Identify and engage in activities that would improve self-image by being consistent with one's values. Target Date: 2023-04-29 Frequency: Biweekly  Progress: 0 Modality: individual  Related Interventions Help the client analyze his/her values and the congruence or incongruence between them and the client's daily activities. Identify and assign activities congruent with the client's values; process them toward improving self-concept and self-esteem. Objective Take verbal responsibility for accomplishments without discounting. Target Date: 2023-04-29 Frequency: Biweekly  Progress: 0 Modality: individual  Related Interventions Ask the client to list accomplishments; process the integration of these into his/her self-image. Objective Identify any secondary gain that is received by speaking negatively about self and refusing to take any risks. Target Date: 2023-04-29 Frequency: Biweekly  Progress: 0 Modality: individual  Related Interventions Teach the client the meaning and power of secondary gain in maintaining negative behavior patterns. Assist the client in identifying how self-disparagement and avoidance of risk-taking could bring secondary gain (e.g., praise from others, others taking over responsibilities). 6. Enhance ability to effectively cope with the full variety of life's worries and anxieties. 7. Establish an inward sense of self-worth, confidence, and competence. 8. Learn and implement coping skills that result in  a reduction of anxiety and worry, and improved daily functioning. 9. Let go of blame and begin to forgive others for pain caused in childhood. 10. Reduce overall frequency, intensity, and duration of the anxiety so that daily functioning is not impaired. Objective Describe situations, thoughts, feelings, and actions associated with anxieties and worries, their impact on functioning, and attempts to resolve them. Target Date: 2023-04-29 Frequency: Biweekly  Progress: 0 Modality: individual  Related Interventions Ask the client to describe his/her past experiences of anxiety and their impact on  functioning; assess the focus, excessiveness, and uncontrollability of the worry and the type, frequency, intensity, and duration of his/her anxiety symptoms (consider using a structured interview such as The Anxiety Disorders Interview Schedule-Adult Version). Objective Verbalize an understanding of the cognitive, physiological, and behavioral components of anxiety and its treatment. Target Date: 2023-04-29 Frequency: Biweekly  Progress: 0 Modality: individual  Related Interventions Discuss how generalized anxiety typically involves excessive worry about unrealistic threats, various bodily expressions of tension, overarousal, and hypervigilance, and avoidance of what is threatening that interact to maintain the problem (see Mastery of Your Anxiety and Worry: Therapist Guide by Renard Matter, and Barlow; Treating Generalized Anxiety Disorder by Rygh and Ida Rogue). Objective Learn and implement calming skills to reduce overall anxiety and manage anxiety symptoms. Target Date: 2023-04-29 Frequency: Biweekly  Progress: 0 Modality: individual  Related Interventions Teach the client calming/relaxation skills (e.g., applied relaxation, progressive muscle relaxation, cue controlled relaxation; mindful breathing; biofeedback) and how to discriminate better between relaxation and tension; teach the client how to  apply these skills to his/her daily life (e.g., New Directions in Progressive Muscle Relaxation by Marcelyn Ditty, and Hazlett-Stevens; Treating Generalized Anxiety Disorder by Rygh and Ida Rogue). Assign the client homework each session in which he/she practices relaxation exercises daily, gradually applying them progressively from non-anxiety-provoking to anxiety-provoking situations; review and reinforce success while providing corrective feedback toward improvement. Assign the client to read about progressive muscle relaxation and other calming strategies in relevant books or treatment manuals (e.g., Progressive Relaxation Training by Robb Matar and Alen Blew; Mastery of Your Anxiety and Worry: Workbook by Earlie Counts). Objective Learn and implement a strategy to limit the association between various environmental settings and worry, delaying the worry until a designated "worry time." Target Date: 2023-04-29 Frequency: Biweekly  Progress: 0 Modality: individual  Related Interventions Explain the rationale for using a worry time as well as how it is to be used; agree upon and implement a worry time with the client. Teach the client how to recognize, stop, and postpone worry to the agreed upon worry time using skills such as thought stopping, relaxation, and redirecting attention (or assign "Making Use of the Thought-Stopping Technique" and/or "Worry Time" in the Adult Psychotherapy Homework Planner by Jongsma to assist skill development); encourage use in daily life; review and reinforce success while providing corrective feedback toward improvement. Objective Verbalize an understanding of the role that cognitive biases play in excessive irrational worry and persistent anxiety symptoms. Target Date: 2023-04-29 Frequency: Biweekly  Progress: 0 Modality: individual  Related Interventions Assist the client in analyzing his/her worries by examining potential biases such as the probability of  the negative expectation occurring, the real consequences of it occurring, his/her ability to control the outcome, the worst possible outcome, and his/her ability to accept it (see "Analyze the Probability of a Feared Event" in the Adult Psychotherapy Homework Planner by Stephannie Li; Cognitive Therapy of Anxiety Disorders by Laurence Slate). Objective Identify, challenge, and replace biased, fearful self-talk with positive, realistic, and empowering self-talk. Target Date: 2023-04-29 Frequency: Biweekly  Progress: 0 Modality: individual  Related Interventions Explore the client's schema and self-talk that mediate his/her fear response; assist him/her in challenging the biases; replace the distorted messages with reality-based alternatives and positive, realistic self-talk that will increase his/her self-confidence in coping with irrational fears (see Cognitive Therapy of Anxiety Disorders by Laurence Slate). Assign the client a homework exercise in which he/she identifies fearful self-talk, identifies biases in the self-talk, generates alternatives, and tests through behavioral experiments (or  assign "Negative Thoughts Trigger Negative Feelings" in the Adult Psychotherapy Homework Planner by [facility]); review and reinforce success, providing corrective feedback toward improvement. Objective Learn and implement personal and interpersonal skills to reduce anxiety and improve interpersonal relationships. Target Date: 2023-04-29 Frequency: Biweekly  Progress: 0 Modality: individual  Related Interventions Use instruction, modeling, and role-playing to build the client's general social, communication, and/or conflict resolution skills. Objective Learn to accept limitations in life and commit to tolerating, rather than avoiding, unpleasant emotions while accomplishing meaningful goals. Target Date: 2023-04-29 Frequency: Biweekly  Progress: 0 Modality: individual  Related Interventions Use techniques from  Acceptance and Commitment Therapy to help client accept uncomfortable realities such as lack of complete control, imperfections, and uncertainty and tolerate unpleasant emotions and thoughts in order to accomplish value-consistent goals. 11. Release the emotions associated with past childhood/family issues, resulting in less resentment and more serenity. 12. Resolve past childhood/family issues, leading to less anger and depression, greater self-esteem, security, and confidence. 13. Resolve the core conflict that is the source of anxiety. 14. Stabilize anxiety level while increasing ability to function on a daily basis.  Diagnosis:Generalized anxiety disorder  PTSD (post-traumatic stress disorder)   Dx by history upon death of father  Plan:  -meet again on Tuesday, December 27, 2022 at 4pm.

## 2022-12-22 DIAGNOSIS — R9082 White matter disease, unspecified: Secondary | ICD-10-CM | POA: Diagnosis not present

## 2022-12-22 DIAGNOSIS — H93A1 Pulsatile tinnitus, right ear: Secondary | ICD-10-CM | POA: Diagnosis not present

## 2022-12-27 ENCOUNTER — Encounter: Payer: Self-pay | Admitting: Professional

## 2022-12-27 ENCOUNTER — Ambulatory Visit (INDEPENDENT_AMBULATORY_CARE_PROVIDER_SITE_OTHER): Payer: BC Managed Care – PPO | Admitting: Professional

## 2022-12-27 DIAGNOSIS — F411 Generalized anxiety disorder: Secondary | ICD-10-CM

## 2022-12-27 DIAGNOSIS — F4310 Post-traumatic stress disorder, unspecified: Secondary | ICD-10-CM
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
## 2023-01-02 DIAGNOSIS — H93A1 Pulsatile tinnitus, right ear: Secondary | ICD-10-CM | POA: Diagnosis not present

## 2023-01-04 ENCOUNTER — Other Ambulatory Visit: Payer: Self-pay | Admitting: Medical-Surgical

## 2023-01-10 ENCOUNTER — Ambulatory Visit: Payer: BC Managed Care – PPO | Admitting: Professional

## 2023-01-11 DIAGNOSIS — Z23 Encounter for immunization: Secondary | ICD-10-CM | POA: Diagnosis not present

## 2023-01-12 ENCOUNTER — Ambulatory Visit: Payer: BC Managed Care – PPO | Admitting: Internal Medicine

## 2023-01-17 ENCOUNTER — Encounter: Payer: BC Managed Care – PPO | Admitting: Physician Assistant

## 2023-01-17 ENCOUNTER — Telehealth: Payer: BC Managed Care – PPO | Admitting: Physician Assistant

## 2023-01-17 ENCOUNTER — Encounter: Payer: Self-pay | Admitting: Medical-Surgical

## 2023-01-17 DIAGNOSIS — R3989 Other symptoms and signs involving the genitourinary system: Secondary | ICD-10-CM

## 2023-01-17 MED ORDER — NITROFURANTOIN MONOHYD MACRO 100 MG PO CAPS
100.0000 mg | ORAL_CAPSULE | Freq: Two times a day (BID) | ORAL | 0 refills | Status: DC
Start: 2023-01-17 — End: 2023-05-01

## 2023-01-17 NOTE — Progress Notes (Signed)
E-Visit for Urinary Problems  We are sorry that you are not feeling well.  Here is how we plan to help!  Based on what you shared with me it looks like you most likely have a simple urinary tract infection.  A UTI (Urinary Tract Infection) is a bacterial infection of the bladder.  Most cases of urinary tract infections are simple to treat but a key part of your care is to encourage you to drink plenty of fluids and watch your symptoms carefully.  I have prescribed MacroBid 100 mg twice a day for 5 days.  Your symptoms should gradually improve. Call us if the burning in your urine worsens, you develop worsening fever, back pain or pelvic pain or if your symptoms do not resolve after completing the antibiotic.  Urinary tract infections can be prevented by drinking plenty of water to keep your body hydrated.  Also be sure when you wipe, wipe from front to back and don't hold it in!  If possible, empty your bladder every 4 hours.  HOME CARE Drink plenty of fluids Compete the full course of the antibiotics even if the symptoms resolve Remember, when you need to go.go. Holding in your urine can increase the likelihood of getting a UTI! GET HELP RIGHT AWAY IF: You cannot urinate You get a high fever Worsening back pain occurs You see blood in your urine You feel sick to your stomach or throw up You feel like you are going to pass out  MAKE SURE YOU  Understand these instructions. Will watch your condition. Will get help right away if you are not doing well or get worse.   Thank you for choosing an e-visit.  Your e-visit answers were reviewed by a board certified advanced clinical practitioner to complete your personal care plan. Depending upon the condition, your plan could have included both over the counter or prescription medications.  Please review your pharmacy choice. Make sure the pharmacy is open so you can pick up prescription now. If there is a problem, you may contact your  provider through MyChart messaging and have the prescription routed to another pharmacy.  Your safety is important to us. If you have drug allergies check your prescription carefully.   For the next 24 hours you can use MyChart to ask questions about today's visit, request a non-urgent call back, or ask for a work or school excuse. You will get an email in the next two days asking about your experience. I hope that your e-visit has been valuable and will speed your recovery.  I have spent 5 minutes in review of e-visit questionnaire, review and updating patient chart, medical decision making and response to patient.   Yavier Snider M Heberto Sturdevant, PA-C  

## 2023-01-17 NOTE — Telephone Encounter (Signed)
This encounter was created in error - please disregard.

## 2023-01-17 NOTE — Progress Notes (Signed)
Duplicate, erroneous

## 2023-01-24 ENCOUNTER — Ambulatory Visit: Payer: BC Managed Care – PPO | Admitting: Professional

## 2023-01-25 DIAGNOSIS — H938X1 Other specified disorders of right ear: Secondary | ICD-10-CM | POA: Diagnosis not present

## 2023-01-25 DIAGNOSIS — H93A1 Pulsatile tinnitus, right ear: Secondary | ICD-10-CM | POA: Diagnosis not present

## 2023-02-17 DIAGNOSIS — M2652 Limited mandibular range of motion: Secondary | ICD-10-CM | POA: Diagnosis not present

## 2023-02-17 DIAGNOSIS — R293 Abnormal posture: Secondary | ICD-10-CM | POA: Diagnosis not present

## 2023-02-17 DIAGNOSIS — M436 Torticollis: Secondary | ICD-10-CM | POA: Diagnosis not present

## 2023-02-17 DIAGNOSIS — M26629 Arthralgia of temporomandibular joint, unspecified side: Secondary | ICD-10-CM | POA: Diagnosis not present

## 2023-02-17 DIAGNOSIS — R6884 Jaw pain: Secondary | ICD-10-CM | POA: Diagnosis not present

## 2023-02-21 ENCOUNTER — Ambulatory Visit: Payer: BC Managed Care – PPO | Admitting: Professional

## 2023-03-03 ENCOUNTER — Other Ambulatory Visit: Payer: Self-pay | Admitting: Medical-Surgical

## 2023-03-03 DIAGNOSIS — E063 Autoimmune thyroiditis: Secondary | ICD-10-CM

## 2023-03-09 ENCOUNTER — Ambulatory Visit: Payer: BC Managed Care – PPO | Admitting: Professional

## 2023-03-21 ENCOUNTER — Ambulatory Visit: Payer: BC Managed Care – PPO | Admitting: Professional

## 2023-03-23 ENCOUNTER — Telehealth: Payer: BC Managed Care – PPO | Admitting: Physician Assistant

## 2023-03-23 DIAGNOSIS — R110 Nausea: Secondary | ICD-10-CM

## 2023-03-23 DIAGNOSIS — R051 Acute cough: Secondary | ICD-10-CM | POA: Diagnosis not present

## 2023-03-23 DIAGNOSIS — J019 Acute sinusitis, unspecified: Secondary | ICD-10-CM | POA: Diagnosis not present

## 2023-03-23 DIAGNOSIS — B9689 Other specified bacterial agents as the cause of diseases classified elsewhere: Secondary | ICD-10-CM

## 2023-03-24 MED ORDER — ONDANSETRON HCL 4 MG PO TABS: 4.0000 mg | ORAL_TABLET | Freq: Three times a day (TID) | ORAL | 0 refills | Status: AC | PRN

## 2023-03-24 MED ORDER — BENZONATATE 100 MG PO CAPS
100.0000 mg | ORAL_CAPSULE | Freq: Three times a day (TID) | ORAL | 0 refills | Status: DC | PRN
Start: 2023-03-24 — End: 2023-05-01

## 2023-03-24 MED ORDER — AMOXICILLIN-POT CLAVULANATE 875-125 MG PO TABS: 1.0000 | ORAL_TABLET | Freq: Two times a day (BID) | ORAL | 0 refills | Status: DC

## 2023-03-24 NOTE — Progress Notes (Signed)
E-Visit for Sinus Problems  We are sorry that you are not feeling well.  Here is how we plan to help!  Based on what you have shared with me it looks like you have sinusitis.  Sinusitis is inflammation and infection in the sinus cavities of the head.  Based on your presentation I believe you most likely have Acute Bacterial Sinusitis.  This is an infection caused by bacteria and is treated with antibiotics. I have prescribed Augmentin 875mg /125mg  one tablet twice daily with food, for 7 days. I have also prescribed Tessalon (benzonatate) perles 100mg  Take 1-2 capsules every 8 hours as needed for cough, and Zofran 4 mg Take 1-2 tablets as needed for nausea. You may use an oral decongestant such as Mucinex D or if you have glaucoma or high blood pressure use plain Mucinex. Saline nasal spray help and can safely be used as often as needed for congestion.  If you develop worsening sinus pain, fever or notice severe headache and vision changes, or if symptoms are not better after completion of antibiotic, please schedule an appointment with a health care provider.    Sinus infections are not as easily transmitted as other respiratory infection, however we still recommend that you avoid close contact with loved ones, especially the very young and elderly.  Remember to wash your hands thoroughly throughout the day as this is the number one way to prevent the spread of infection!  Home Care: Only take medications as instructed by your medical team. Complete the entire course of an antibiotic. Do not take these medications with alcohol. A steam or ultrasonic humidifier can help congestion.  You can place a towel over your head and breathe in the steam from hot water coming from a faucet. Avoid close contacts especially the very young and the elderly. Cover your mouth when you cough or sneeze. Always remember to wash your hands.  Get Help Right Away If: You develop worsening fever or sinus pain. You develop  a severe head ache or visual changes. Your symptoms persist after you have completed your treatment plan.  Make sure you Understand these instructions. Will watch your condition. Will get help right away if you are not doing well or get worse.  Thank you for choosing an e-visit.  Your e-visit answers were reviewed by a board certified advanced clinical practitioner to complete your personal care plan. Depending upon the condition, your plan could have included both over the counter or prescription medications.  Please review your pharmacy choice. Make sure the pharmacy is open so you can pick up prescription now. If there is a problem, you may contact your provider through [employer] and have the prescription routed to another pharmacy.  Your safety is important to [place]. If you have drug allergies check your prescription carefully.   For the next 24 hours you can use MyChart to ask questions about today's visit, request a non-urgent call back, or ask for a work or school excuse. You will get an email in the next two days asking about your experience. I hope that your e-visit has been valuable and will speed your recovery.   I have spent 5 minutes in review of e-visit questionnaire, review and updating patient chart, medical decision making and response to patient.   Margaretann Loveless, PA-C

## 2023-04-04 ENCOUNTER — Ambulatory Visit: Payer: BC Managed Care – PPO | Admitting: Professional

## 2023-04-04 DIAGNOSIS — Z01419 Encounter for gynecological examination (general) (routine) without abnormal findings: Secondary | ICD-10-CM | POA: Diagnosis not present

## 2023-04-04 DIAGNOSIS — Z1231 Encounter for screening mammogram for malignant neoplasm of breast: Secondary | ICD-10-CM | POA: Diagnosis not present

## 2023-04-04 DIAGNOSIS — Z6827 Body mass index (BMI) 27.0-27.9, adult: Secondary | ICD-10-CM | POA: Diagnosis not present

## 2023-04-08 ENCOUNTER — Other Ambulatory Visit: Payer: Self-pay | Admitting: Medical-Surgical

## 2023-04-08 ENCOUNTER — Other Ambulatory Visit: Payer: Self-pay | Admitting: Cardiovascular Disease

## 2023-04-20 ENCOUNTER — Other Ambulatory Visit: Payer: Self-pay | Admitting: Medical-Surgical

## 2023-05-01 ENCOUNTER — Telehealth: Payer: BC Managed Care – PPO | Admitting: Physician Assistant

## 2023-05-01 ENCOUNTER — Other Ambulatory Visit: Payer: Self-pay | Admitting: Medical-Surgical

## 2023-05-01 DIAGNOSIS — R3989 Other symptoms and signs involving the genitourinary system: Secondary | ICD-10-CM

## 2023-05-01 MED ORDER — NITROFURANTOIN MONOHYD MACRO 100 MG PO CAPS
100.0000 mg | ORAL_CAPSULE | Freq: Two times a day (BID) | ORAL | 0 refills | Status: DC
Start: 2023-05-01 — End: 2023-05-23

## 2023-05-01 NOTE — Progress Notes (Signed)

## 2023-05-08 ENCOUNTER — Encounter: Payer: Self-pay | Admitting: Medical-Surgical

## 2023-05-09 MED ORDER — FLUOXETINE HCL 20 MG PO CAPS: 20.0000 mg | ORAL_CAPSULE | Freq: Every day | ORAL | 1 refills | Status: DC

## 2023-05-11 ENCOUNTER — Ambulatory Visit: Payer: BC Managed Care – PPO | Admitting: Internal Medicine

## 2023-05-12 ENCOUNTER — Telehealth: Payer: BC Managed Care – PPO | Admitting: Physician Assistant

## 2023-05-12 DIAGNOSIS — R3989 Other symptoms and signs involving the genitourinary system: Secondary | ICD-10-CM

## 2023-05-12 NOTE — Progress Notes (Signed)
  Because you have had incomplete treatment of a UTI there may now be resistance and a urine culture should be obtained before further treatment, I feel your condition warrants further evaluation and I recommend that you be seen in a face-to-face visit.   NOTE: There will be NO CHARGE for this E-Visit   If you are having a true medical emergency, please call 911.     For an urgent face to face visit, [facility] has multiple urgent care centers for your convenience.  Click the link below for the full list of locations and hours, walk-in wait times, appointment scheduling options and driving directions:  Urgent Care - [place], [place], [place], [place], [place], [place]  [place]     Your MyChart E-visit questionnaire answers were reviewed by a board certified advanced clinical practitioner to complete your personal care plan based on your specific symptoms.    Thank you for using e-Visits.     I have spent 5 minutes in review of e-visit questionnaire, review and updating patient chart, medical decision making and response to patient.   Margaretann Loveless, PA-C

## 2023-05-18 ENCOUNTER — Other Ambulatory Visit: Payer: Self-pay | Admitting: Cardiovascular Disease

## 2023-05-18 ENCOUNTER — Other Ambulatory Visit: Payer: Self-pay | Admitting: Medical-Surgical

## 2023-05-18 ENCOUNTER — Ambulatory Visit: Payer: BC Managed Care – PPO | Admitting: Internal Medicine

## 2023-05-22 ENCOUNTER — Other Ambulatory Visit: Payer: Self-pay | Admitting: Medical-Surgical

## 2023-05-22 DIAGNOSIS — R768 Other specified abnormal immunological findings in serum: Secondary | ICD-10-CM

## 2023-05-23 ENCOUNTER — Ambulatory Visit: Payer: BC Managed Care – PPO | Admitting: Medical-Surgical

## 2023-05-23 ENCOUNTER — Encounter: Payer: Self-pay | Admitting: Medical-Surgical

## 2023-05-23 VITALS — BP 103/65 | HR 64 | Resp 20 | Ht 62.0 in | Wt 159.1 lb

## 2023-05-23 DIAGNOSIS — R768 Other specified abnormal immunological findings in serum: Secondary | ICD-10-CM

## 2023-05-23 DIAGNOSIS — E063 Autoimmune thyroiditis: Secondary | ICD-10-CM | POA: Diagnosis not present

## 2023-05-23 DIAGNOSIS — R300 Dysuria: Secondary | ICD-10-CM | POA: Diagnosis not present

## 2023-05-23 LAB — POCT URINALYSIS DIP (CLINITEK)
Bilirubin, UA: NEGATIVE
Blood, UA: NEGATIVE
Glucose, UA: NEGATIVE mg/dL
Ketones, POC UA: NEGATIVE mg/dL
Nitrite, UA: POSITIVE — AB
POC PROTEIN,UA: NEGATIVE
Spec Grav, UA: 1.02 (ref 1.010–1.025)
Urobilinogen, UA: 0.2 U/dL
pH, UA: 6.5 (ref 5.0–8.0)

## 2023-05-23 MED ORDER — SULFAMETHOXAZOLE-TRIMETHOPRIM 800-160 MG PO TABS: 1.0000 | ORAL_TABLET | Freq: Two times a day (BID) | ORAL | 0 refills | Status: DC

## 2023-05-23 MED ORDER — LEVOTHYROXINE SODIUM 88 MCG PO TABS: 88.0000 ug | ORAL_TABLET | Freq: Every day | ORAL | 1 refills | Status: DC

## 2023-05-23 MED ORDER — VALACYCLOVIR HCL 500 MG PO TABS: 500.0000 mg | ORAL_TABLET | Freq: Every day | ORAL | 1 refills | Status: DC

## 2023-05-23 MED ORDER — BUPROPION HCL ER (XL) 300 MG PO TB24: 300.0000 mg | ORAL_TABLET | Freq: Every day | ORAL | 3 refills | Status: DC

## 2023-05-23 NOTE — Progress Notes (Signed)
 Subjective:  Patient ID: Kara Cooper, female    DOB: 03-24-1966, 58 y.o.   MRN: [id number]  Patient Care Team: Willo Mini, NP as PCP - General (Nurse Practitioner) Nahser, Aleene PARAS, MD as PCP - Cardiology (Cardiology)   Chief Complaint:  Urinary Tract Infection   HPI:  Kara Cooper is a 58 y.o. female presenting on 05/23/2023 for Urinary Tract Infection   History, Exam,  Impression and Plan Urinary Tract Infection     1. Dysuria (Primary) Presents with foul smelling urine, abdominal cramping and voiding discomfort/burning. Has had 3 UTIs since November 2024 and 5 in the last year. No cultures or urine analysis in chart. Pt has elevated urine nitrogen and leukocytes this visit. Culture sent. Pt started on Bactrim  for 3 days awaiting culture and senitivity. Pt to be called with any results and possible follow-up/changes to regimen. Discussed with patient possible future consult to urologist for resistant UTI.   - POCT URINALYSIS DIP (CLINITEK) - Urine Culture - sulfamethoxazole -trimethoprim  (BACTRIM  DS) 800-160 MG tablet; Take 1 tablet by mouth 2 (two) times daily.  Dispense: 6 tablet; Refill: 0  2. Hypothyroidism due to Hashimoto's thyroiditis Pt taking Levothyroxine  as directed. No problems with any side effects and TSH has been within goal range continue regimen without changes today and refill levothyroxine  - levothyroxine  (SYNTHROID ) 88 MCG tablet; Take 1 tablet (88 mcg total) by mouth daily before breakfast.  Dispense: 90 tablet; Refill: 1  3. HSV-1 and HSV-2 seropositive Pt taking Valtrex  as directed. Tolerating medication without side effects. Denies any HSV flare ups. Well managed on current regimen. Continue and refill valacyclovir .  - valACYclovir  (VALTREX ) 500 MG tablet; Take 1 tablet (500 mg total) by mouth daily.  Dispense: 90 tablet; Refill: 1  Continue all other maintenance medications. No other refills requested at this time.   Follow up plan: Return in  about 3 months (around 08/20/2023) for annual physical exam.   Relevant past medical, surgical, family, and social history reviewed and updated as indicated.  Allergies and medications reviewed and updated. Data reviewed: Chart in Epic.   Past Medical History:  Diagnosis Date   Acute right pyelonephritis 04/04/2017   Anemia    in past   Basal cell carcinoma    treated with Moh's procedure   Chicken pox    in past   Headache(784.0)    frequent   History of seasonal allergies    Irregular menses    Migraine    Other and unspecified ovarian cysts    come and go   Postural orthostatic tachycardia syndrome    Dr. Wonda   UTI (lower urinary tract infection)    history of    Past Surgical History:  Procedure Laterality Date   CERVICAL CONIZATION W/BX  1987, 1988   DILATION AND CURETTAGE OF UTERUS      Social History   Socioeconomic History   Marital status: Single    Spouse name: Not on file   Number of children: 2   Years of education: Not on file   Highest education level: Master's degree (e.g., MA, MS, MEng, MEd, MSW, MBA)  Occupational History   Occupation: RN  Tobacco Use   Smoking status: Never   Smokeless tobacco: Never  Substance and Sexual Activity   Alcohol use: Yes    Comment: Occasional   Drug use: No   Sexual activity: Not on file  Other Topics Concern   Not on file  Social History Narrative  Student.  Exercises every day, also cycles.   Social Drivers of Corporate Investment Banker Strain: Low Risk  (05/22/2023)   Overall Financial Resource Strain (CARDIA)    Difficulty of Paying Living Expenses: Not hard at all  Food Insecurity: No Food Insecurity (05/22/2023)   Hunger Vital Sign    Worried About Running Out of Food in the Last Year: Never true    Ran Out of Food in the Last Year: Never true  Transportation Needs: No Transportation Needs (05/22/2023)   PRAPARE - Administrator, Civil Service (Medical): No    Lack of Transportation  (Non-Medical): No  Physical Activity: Sufficiently Active (05/22/2023)   Exercise Vital Sign    Days of Exercise per Week: 4 days    Minutes of Exercise per Session: 40 min  Stress: No Stress Concern Present (05/22/2023)   [employer] of Occupational Health - Occupational Stress Questionnaire    Feeling of Stress : Only a little  Social Connections: Unknown (05/22/2023)   Social Connection and Isolation Panel [NHANES]    Frequency of Communication with Friends and Family: More than three times a week    Frequency of Social Gatherings with Friends and Family: Once a week    Attends Religious Services: Patient declined    Database Administrator or Organizations: Yes    Attends Engineer, Structural: More than 4 times per year    Marital Status: Patient declined  Intimate Partner Violence: Unknown (12/12/2022)   Received from [facility]   HITS    Physically Hurt: Not on file    Insult or Talk Down To: Not on file    Threaten Physical Harm: Not on file    Scream or Curse: Not on file    Outpatient Encounter Medications as of 05/23/2023  Medication Sig   acetaminophen  (TYLENOL ) 500 MG tablet Take OTC as directed   albuterol  (VENTOLIN  HFA) 108 (90 Base) MCG/ACT inhaler 2 PUFFS EVERY 6 HOURS AS NEEDED SOB   APPLE CIDER VINEGAR-GINGER PO Take 2 capsules by mouth in the morning and at bedtime.   cetirizine (ZYRTEC) 10 MG tablet Take 10 mg by mouth daily.   FLUoxetine  (PROZAC ) 20 MG capsule Take 1 capsule (20 mg total) by mouth daily.   fluticasone  (FLONASE ) 50 MCG/ACT nasal spray USE 2 SPRAYS IN EACH NOSTRIL DAILY AS NEEDED   hydrOXYzine  (ATARAX ) 50 MG tablet Take 0.5-1 tablets (25-50 mg total) by mouth at bedtime and may repeat dose one time if needed.   Ibuprofen 200 MG CAPS    metoprolol  succinate (TOPROL -XL) 25 MG 24 hr tablet Take 1 tablet (25 mg total) by mouth daily.   Multiple Vitamin (MULTIVITAMIN) tablet Take 1 tablet by mouth daily.     omeprazole  (PRILOSEC) 20 MG  capsule Take 20 mg by mouth daily.   ondansetron  (ZOFRAN ) 4 MG tablet Take 1-2 tablets (4-8 mg total) by mouth every 8 (eight) hours as needed for nausea or vomiting.   propranolol  (INDERAL ) 10 MG tablet Take 1 tablet (10 mg total) by mouth 4 (four) times daily as needed (SVT).   sulfamethoxazole -trimethoprim  (BACTRIM  DS) 800-160 MG tablet Take 1 tablet by mouth 2 (two) times daily.   SUMAtriptan  (IMITREX ) 50 MG tablet Take 1 tablet (50 mg total) by mouth daily as needed for migraine. Take one by mouth times one as needed for migraine- max one pill in a day.   [DISCONTINUED] buPROPion  (WELLBUTRIN  XL) 300 MG 24 hr tablet Take 1 tablet (  300 mg total) by mouth daily. NEEDS APPOINTMENT FOR FURTHER REFILLS.   [DISCONTINUED] levothyroxine  (SYNTHROID ) 88 MCG tablet Take 1 tablet (88 mcg total) by mouth daily before breakfast.   [DISCONTINUED] valACYclovir  (VALTREX ) 500 MG tablet TAKE ONE TABLET BY MOUTH DAILY   buPROPion  (WELLBUTRIN  XL) 300 MG 24 hr tablet Take 1 tablet (300 mg total) by mouth daily.   levothyroxine  (SYNTHROID ) 88 MCG tablet Take 1 tablet (88 mcg total) by mouth daily before breakfast.   valACYclovir  (VALTREX ) 500 MG tablet Take 1 tablet (500 mg total) by mouth daily.   [DISCONTINUED] nitrofurantoin , macrocrystal-monohydrate, (MACROBID ) 100 MG capsule Take 1 capsule (100 mg total) by mouth 2 (two) times daily.   No facility-administered encounter medications on file as of 05/23/2023.    No Known Allergies  Review of Systems      Objective:  BP 103/65 (BP Location: Left Arm, Cuff Size: Normal)   Pulse 64   Resp 20   Ht 5' 2 (1.575 m)   Wt 159 lb 1.6 oz (72.2 kg)   LMP 05/03/2017 (Approximate)   SpO2 100%   BMI 29.10 kg/m    Wt Readings from Last 3 Encounters:  05/23/23 159 lb 1.6 oz (72.2 kg)  10/04/22 154 lb (69.9 kg)  09/01/22 154 lb 6.4 oz (70 kg)    Physical Exam Cardiovascular:     Rate and Rhythm: Normal rate and regular rhythm. No extrasystoles are present.     Pulses: Normal pulses.     Heart sounds: Normal heart sounds. No murmur heard.    No gallop. No S3 or S4 sounds.  Pulmonary:     Effort: Pulmonary effort is normal.     Breath sounds: Normal breath sounds. No decreased breath sounds, wheezing, rhonchi or rales.  Musculoskeletal:     Right lower leg: No edema.     Left lower leg: No edema.     Results for orders placed or performed in visit on 05/23/23  POCT URINALYSIS DIP (CLINITEK)   Collection Time: 05/23/23 11:08 AM  Result Value Ref Range   Color, UA yellow yellow   Clarity, UA clear clear   Glucose, UA negative negative mg/dL   Bilirubin, UA negative negative   Ketones, POC UA negative negative mg/dL   Spec Grav, UA 8.979 8.989 - 1.025   Blood, UA negative negative   pH, UA 6.5 5.0 - 8.0   POC PROTEIN,UA negative negative, trace   Urobilinogen, UA 0.2 0.2 or 1.0 E.U./dL   Nitrite, UA Positive (A) Negative   Leukocytes, UA Small (1+) (A) Negative       Pertinent labs & imaging results that were available during my care of the patient were reviewed by me and considered in my medical decision making.   Continue healthy lifestyle choices, including diet (rich in fruits, vegetables, and lean proteins, and low in salt and simple carbohydrates) and exercise (at least 30 minutes of moderate physical activity daily).  The above assessment and management plan was discussed with the patient. The patient verbalized understanding of and has agreed to the management plan. Patient is aware to call the clinic if they develop any new symptoms or if symptoms persist or worsen. Patient is aware when to return to the clinic for a follow-up visit. Patient educated on when it is appropriate to go to the emergency department.   Nancyann Huh Student AGNP

## 2023-05-23 NOTE — Progress Notes (Signed)
 Medical screening examination/treatment was performed by qualified clinical staff member and as supervising provider I was immediately available for consultation/collaboration. I have reviewed documentation and agree with assessment and plan.  Thayer Ohm, DNP, APRN, FNP-BC [facility]

## 2023-05-26 ENCOUNTER — Encounter: Payer: Self-pay | Admitting: Medical-Surgical

## 2023-05-26 LAB — URINE CULTURE

## 2023-05-30 ENCOUNTER — Encounter: Payer: Self-pay | Admitting: Cardiovascular Disease

## 2023-05-30 NOTE — Progress Notes (Unsigned)
Cardiology Office Note:    Date:  05/31/2023   ID:  Kara Cooper, DOB 1966-04-10, MRN [id number]  PCP:  Christen Butter, NP   [facility] Providers Cardiologist:  Kristeen Miss, MD     Referring MD: Christen Butter, NP   Chief Complaint  Patient presents with   SVT    History of Present Illness:    Kara Cooper is a 58 y.o. female with a hx of recent hopitalization for SVT and mildly + troponins  Coronary CTA on Nov. 29, 2023 reveals a CAC score of 0  Normal coronaries - no plaque  Echo - normal LVEF of 55-60%, normal diastolic function Minimal AV gradient measured.  No aortic stenosis    Dec. 21, 2023  Kara Cooper is seen for follow up of her SVT She had a prolonged episode of SVT lasting 6 or 7 hours and then presented to the hospital.  She ended up having elevated troponin levels following that 7-hour episode of SVT.  Peak troponin was 119. Echocardiogram reveals normal left ventricular systolic function.  She has no significant valvular disease. Coronary CTA reveals no obstructive coronary artery disease.  Coronary calcium score was 0.  Some rare ,very brief episodes of palpitations   Overall is doing great .    Feb. 12, 2025 Kara Cooper is seen for follow up of her SVT .    Had a prolonged episode of SVT which resulted in cardiac strain and + troponins in Nov. 2023 Cor CTA revealed a CAC score of 0 with normal coronaries  Echo :  normal LVEF of 55-60%  Has had about 5 episodes of SVT  Last 30 min - 1 hour  , HR 150-160 No CP   Is very active  Works out every day      Past Medical History:  Diagnosis Date   Acute right pyelonephritis 04/04/2017   Anemia    in past   Basal cell carcinoma    treated with Moh's procedure   Chicken pox    in past   Headache(784.0)    frequent   History of seasonal allergies    Irregular menses    Migraine    Other and unspecified ovarian cysts    come and go   Postural orthostatic tachycardia syndrome    Dr. Excell Seltzer    UTI (lower urinary tract infection)    history of    Past Surgical History:  Procedure Laterality Date   CERVICAL CONIZATION W/BX  1987, 1988   DILATION AND CURETTAGE OF UTERUS      Current Medications: Current Meds  Medication Sig   acetaminophen (TYLENOL) 500 MG tablet Take OTC as directed   albuterol (VENTOLIN HFA) 108 (90 Base) MCG/ACT inhaler 2 PUFFS EVERY 6 HOURS AS NEEDED SOB   APPLE CIDER VINEGAR-GINGER PO Take 2 capsules by mouth in the morning and at bedtime.   buPROPion (WELLBUTRIN XL) 300 MG 24 hr tablet Take 1 tablet (300 mg total) by mouth daily.   cetirizine (ZYRTEC) 10 MG tablet Take 10 mg by mouth daily.   FLUoxetine (PROZAC) 20 MG capsule Take 1 capsule (20 mg total) by mouth daily.   fluticasone (FLONASE) 50 MCG/ACT nasal spray USE 2 SPRAYS IN EACH NOSTRIL DAILY AS NEEDED   hydrOXYzine (ATARAX) 50 MG tablet Take 0.5-1 tablets (25-50 mg total) by mouth at bedtime and may repeat dose one time if needed.   Ibuprofen 200 MG CAPS    levothyroxine (SYNTHROID) 88 MCG tablet Take 1 tablet (  88 mcg total) by mouth daily before breakfast.   Multiple Vitamin (MULTIVITAMIN) tablet Take 1 tablet by mouth daily.     omeprazole (PRILOSEC) 20 MG capsule Take 20 mg by mouth daily.   ondansetron (ZOFRAN) 4 MG tablet Take 1-2 tablets (4-8 mg total) by mouth every 8 (eight) hours as needed for nausea or vomiting.   sulfamethoxazole-trimethoprim (BACTRIM DS) 800-160 MG tablet Take 1 tablet by mouth 2 (two) times daily.   SUMAtriptan (IMITREX) 50 MG tablet Take 1 tablet (50 mg total) by mouth daily as needed for migraine. Take one by mouth times one as needed for migraine- max one pill in a day.   valACYclovir (VALTREX) 500 MG tablet Take 1 tablet (500 mg total) by mouth daily.   [DISCONTINUED] metoprolol succinate (TOPROL-XL) 25 MG 24 hr tablet Take 1 tablet (25 mg total) by mouth daily.   [DISCONTINUED] propranolol (INDERAL) 10 MG tablet Take 1 tablet (10 mg total) by mouth 4 (four)  times daily as needed (SVT).     Allergies:   Patient has no known allergies.   Social History   Socioeconomic History   Marital status: Single    Spouse name: Not on file   Number of children: 2   Years of education: Not on file   Highest education level: Master's degree (e.g., MA, MS, MEng, MEd, MSW, MBA)  Occupational History   Occupation: RN  Tobacco Use   Smoking status: Never   Smokeless tobacco: Never  Substance and Sexual Activity   Alcohol use: Yes    Comment: Occasional   Drug use: No   Sexual activity: Not on file  Other Topics Concern   Not on file  Social History Narrative   Student.  Exercises every day, also cycles.   Social Drivers of Corporate investment banker Strain: Low Risk  (05/22/2023)   Overall Financial Resource Strain (CARDIA)    Difficulty of Paying Living Expenses: Not hard at all  Food Insecurity: No Food Insecurity (05/22/2023)   Hunger Vital Sign    Worried About Running Out of Food in the Last Year: Never true    Ran Out of Food in the Last Year: Never true  Transportation Needs: No Transportation Needs (05/22/2023)   PRAPARE - Administrator, Civil Service (Medical): No    Lack of Transportation (Non-Medical): No  Physical Activity: Sufficiently Active (05/22/2023)   Exercise Vital Sign    Days of Exercise per Week: 4 days    Minutes of Exercise per Session: 40 min  Stress: No Stress Concern Present (05/22/2023)   [employer] of Occupational Health - Occupational Stress Questionnaire    Feeling of Stress : Only a little  Social Connections: Unknown (05/22/2023)   Social Connection and Isolation Panel [NHANES]    Frequency of Communication with Friends and Family: More than three times a week    Frequency of Social Gatherings with Friends and Family: Once a week    Attends Religious Services: Patient declined    Database administrator or Organizations: Yes    Attends Engineer, structural: More than 4 times per year     Marital Status: Patient declined     Family History: The patient's family history includes Depression in her daughter; Diabetes in her daughter; Heart disease in her mother; Hyperlipidemia in her mother; Migraines in her daughter.  ROS:   Please see the history of present illness.     All other systems reviewed and are  negative.  EKGs/Labs/Other Studies Reviewed:    The following studies were reviewed today: Hospital records   EKG:     EKG Interpretation Date/Time:  Wednesday May 31 2023 14:21:12 EST Ventricular Rate:  66 PR Interval:  166 QRS Duration:  78 QT Interval:  436 QTC Calculation: 457 R Axis:   68  Text Interpretation: Normal sinus rhythm Normal ECG When compared with ECG of 15-Mar-2022 21:42, No significant change was found Confirmed by Kristeen Miss [phone]) on 05/31/2023 2:29:24 PM    Recent Labs: 09/01/2022: ALT 12; BUN 15; Creat 0.89; Hemoglobin 12.7; Platelets 230; Potassium 4.3; Sodium 140; TSH 2.31  Recent Lipid Panel    Component Value Date/Time   CHOL 237 (H) 09/01/2022 1556   TRIG 94 09/01/2022 1556   HDL 70 09/01/2022 1556   CHOLHDL 3.4 09/01/2022 1556   VLDL 13.2 12/19/2012 1105   LDLCALC 147 (H) 09/01/2022 1556     Risk Assessment/Calculations:        Physical Exam:     Physical Exam: Blood pressure 120/82, pulse 66, height 5\' 3"  (1.6 m), weight 155 lb 9.6 oz (70.6 kg), last menstrual period 05/03/2017, SpO2 99%.       GEN:  Well nourished, well developed in no acute distress HEENT: Normal NECK: No JVD; No carotid bruits LYMPHATICS: No lymphadenopathy CARDIAC: RRR very soft systolic outflow murmur  RESPIRATORY:  Clear to auscultation without rales, wheezing or rhonchi  ABDOMEN: Soft, non-tender, non-distended MUSCULOSKELETAL:  No edema; No deformity  SKIN: Warm and dry NEUROLOGIC:  Alert and oriented x 3  ASSESSMENT:    1. SVT (supraventricular tachycardia) (HCC)     PLAN:       Supraventricular tachycardia :  Nevelyn continues to have intermittent episodes of SVT.  The episodes last from 30 minutes to 1 hour.  She typically controls them with propranolol.  I do not want to increase her Toprol-XL because I think would cause some excess fatigue. We briefly discussed scheduling her for an ablation.  She does not want to do that at this time.  She will continue medical therapy for now   2.  Hyperlipidemia: Last LDL is 147.  Her coronary calcium score is 0.  I encouraged her to work on cholesterol reduction and aim for an LDL of at least below 100.  She will work through her primary medical doctor.  I told her we would be happy to help manage this if she would like.          Medication Adjustments/Labs and Tests Ordered: Current medicines are reviewed at length with the patient today.  Concerns regarding medicines are outlined above.  Orders Placed This Encounter  Procedures   EKG 12-Lead   Meds ordered this encounter  Medications   metoprolol succinate (TOPROL-XL) 25 MG 24 hr tablet    Sig: Take 1 tablet (25 mg total) by mouth daily.    Dispense:  90 tablet    Refill:  3   propranolol (INDERAL) 10 MG tablet    Sig: Take 1 tablet (10 mg total) by mouth 4 (four) times daily as needed (SVT).    Dispense:  120 tablet    Refill:  6    Patient Instructions  Medication Instructions:  REFILLED Propranolol 10mg  & Metoprolol Succinate *If you need a refill on your cardiac medications before your next appointment, please call your pharmacy*  Follow-Up: At [facility], you and your health needs are our priority.  As part of our continuing mission  to provide you with exceptional heart care, we have created designated Provider Care Teams.  These Care Teams include your primary Cardiologist (physician) and Advanced Practice Providers (APPs -  Physician Assistants and Nurse Practitioners) who all work together to provide you with the care you need, when you need it.  Your next appointment:   1  year(s)  Provider:   Jodelle Red, MD     Signed, Kristeen Miss, MD  05/31/2023 5:02 PM    [facility]

## 2023-05-31 ENCOUNTER — Ambulatory Visit: Payer: BC Managed Care – PPO | Attending: Cardiovascular Disease | Admitting: Cardiovascular Disease

## 2023-05-31 ENCOUNTER — Encounter: Payer: Self-pay | Admitting: Cardiovascular Disease

## 2023-05-31 VITALS — BP 120/82 | HR 66 | Ht 63.0 in | Wt 155.6 lb

## 2023-05-31 DIAGNOSIS — I471 Supraventricular tachycardia, unspecified: Secondary | ICD-10-CM | POA: Diagnosis not present

## 2023-05-31 MED ORDER — METOPROLOL SUCCINATE ER 25 MG PO TB24: 25.0000 mg | ORAL_TABLET | Freq: Every day | ORAL | 3 refills | Status: AC

## 2023-05-31 MED ORDER — PROPRANOLOL HCL 10 MG PO TABS: 10.0000 mg | ORAL_TABLET | Freq: Four times a day (QID) | ORAL | 6 refills | Status: AC | PRN

## 2023-05-31 NOTE — Patient Instructions (Signed)
Medication Instructions:  REFILLED Propranolol 10mg  & Metoprolol Succinate *If you need a refill on your cardiac medications before your next appointment, please call your pharmacy*  Follow-Up: At [facility], you and your health needs are our priority.  As part of our continuing mission to provide you with exceptional heart care, we have created designated Provider Care Teams.  These Care Teams include your primary Cardiologist (physician) and Advanced Practice Providers (APPs -  Physician Assistants and Nurse Practitioners) who all work together to provide you with the care you need, when you need it.  Your next appointment:   1 year(s)  Provider:   Jodelle Red, MD

## 2023-06-06 ENCOUNTER — Encounter: Payer: Self-pay | Admitting: Cardiovascular Disease

## 2023-07-18 ENCOUNTER — Encounter: Payer: Self-pay | Admitting: Medical-Surgical

## 2023-07-27 ENCOUNTER — Encounter: Payer: Self-pay | Admitting: Sports Medicine

## 2023-07-27 ENCOUNTER — Ambulatory Visit

## 2023-07-27 ENCOUNTER — Ambulatory Visit (INDEPENDENT_AMBULATORY_CARE_PROVIDER_SITE_OTHER): Admitting: Sports Medicine

## 2023-07-27 DIAGNOSIS — M545 Low back pain, unspecified: Secondary | ICD-10-CM | POA: Diagnosis not present

## 2023-07-27 DIAGNOSIS — M5416 Radiculopathy, lumbar region: Secondary | ICD-10-CM | POA: Diagnosis not present

## 2023-07-27 DIAGNOSIS — M48061 Spinal stenosis, lumbar region without neurogenic claudication: Secondary | ICD-10-CM | POA: Diagnosis not present

## 2023-07-27 MED ORDER — PREDNISONE 50 MG PO TABS: ORAL_TABLET | ORAL | 0 refills | Status: DC

## 2023-07-27 NOTE — Assessment & Plan Note (Signed)
 This is a very pleasant 58 year old female, she has had about a month of pain right sided low back with radiation down the back of the right leg down to the outside of the right foot. No red flag symptoms. Worse with sitting, flexion, Valsalva. On exam she has no discrete areas of tenderness, she does have a strongly positive straight leg raise on the right reproducing pain but not paresthesias. We discussed the evolutionary anthropology of lumbar disc disease, we will add x-rays, formal PT, prednisone, return to see me in 6 weeks, MR for interventional planning if not better.

## 2023-07-27 NOTE — Progress Notes (Signed)
    Procedures performed today:    None.  Independent interpretation of notes and tests performed by another provider:   None.  Brief History, Exam, Impression, and Recommendations:    Right lumbar radiculitis This is a very pleasant 58 year old female, she has had about a month of pain right sided low back with radiation down the back of the right leg down to the outside of the right foot. No red flag symptoms. Worse with sitting, flexion, Valsalva. On exam she has no discrete areas of tenderness, she does have a strongly positive straight leg raise on the right reproducing pain but not paresthesias. We discussed the evolutionary anthropology of lumbar disc disease, we will add x-rays, formal PT, prednisone, return to see me in 6 weeks, MR for interventional planning if not better.    ____________________________________________ Ihor Austin. Benjamin Stain, M.D., ABFM., CAQSM., AME. Primary Care and Sports Medicine [facility]  Adjunct Professor of Family Medicine  [place] [employer] [employer]  Restaurant manager, fast food

## 2023-07-31 NOTE — Therapy (Unsigned)
 OUTPATIENT PHYSICAL THERAPY THORACOLUMBAR EVALUATION   Patient Name: Kara Cooper MRN: [id number] DOB:Aug 19, 1965, 58 y.o., female Today's Date: 08/01/2023  END OF SESSION:  PT End of Session - 08/01/23 1924     Visit Number 1    Number of Visits 16    Date for PT Re-Evaluation 09/26/23    Authorization Type BCBS co pay  - none    Authorization Time Period year    Authorization - Visit Number 1    Authorization - Number of Visits 29    PT Start Time 1530    PT Stop Time 1615    PT Time Calculation (min) 45 min    Activity Tolerance Patient tolerated treatment well             Past Medical History:  Diagnosis Date   Acute right pyelonephritis 04/04/2017   Anemia    in past   Basal cell carcinoma    treated with Moh's procedure   Chicken pox    in past   Headache(784.0)    frequent   History of seasonal allergies    Irregular menses    Migraine    Other and unspecified ovarian cysts    come and go   Postural orthostatic tachycardia syndrome    Dr. Arlester Ladd   UTI (lower urinary tract infection)    history of   Past Surgical History:  Procedure Laterality Date   CERVICAL CONIZATION W/BX  1987, 1988   DILATION AND CURETTAGE OF UTERUS     Patient Active Problem List   Diagnosis Date Noted   Right lumbar radiculitis 07/27/2023   Pulsatile tinnitus of both ears 10/04/2022   Ventricular tachycardia (HCC) 09/01/2022   Generalized anxiety disorder 03/29/2022   Elevated troponin 03/15/2022   UTI (urinary tract infection) 03/15/2022   Anxiety 03/15/2022   Insomnia 03/15/2022   GERD (gastroesophageal reflux disease) 03/15/2022   SVT (supraventricular tachycardia) (HCC) 03/14/2022   Herpes simplex 09/19/2019   Weight gain 09/28/2018   Arthralgia of both hands 01/18/2018   Hypothyroidism due to Hashimoto's thyroiditis 07/05/2017   Chronic fatigue 05/17/2017   Fracture of coronoid process of ulna, left, closed 01/24/2017   Stress reaction 09/27/2013   Routine  general medical examination at a health care facility 12/19/2012   Syncope/dysautomia 01/03/2011   Allergic rhinitis 12/29/2008   Migraine with aura 06/19/2008    PCP: Cherre Cornish, NP  REFERRING PROVIDER: Dr Annemarie Kil  REFERRING DIAG: R lumbar radiculitis  Rationale for Evaluation and Treatment: Rehabilitation  THERAPY DIAG:  Right lumbar radiculitis  Other symptoms and signs involving the musculoskeletal system  ONSET DATE: 06/17/23  SUBJECTIVE:  SUBJECTIVE STATEMENT: Patient reports that she started having pain in the R LB and down the R LE ~ 5 weeks ago which may have been due to on line exercises but she is not sure. About 2 weeks ago she was putting mulch in the yard and noticed increased pain after that. Has increased pain with walking up hills or up stairs. Pain has continued in the R LB and LE but she is now also having pain in L LB.  PERTINENT HISTORY:  Denies prior LBP; SBT; hoshimmotes disease; fx of L ulnar head 2019  PAIN:  Are you having pain? Yes: NPRS scale: 7/10 Pain location: R > L LB and into R LE posterior/lateral R thigh to posterior calf; foot and lateral three toes  Pain description: sharp; throbbing  Aggravating factors: walking up hill; stairs; driving; sitting  Relieving factors: nothing   PRECAUTIONS: None  RED FLAGS: None   WEIGHT BEARING RESTRICTIONS: No  FALLS:  Has patient fallen in last 6 months? 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  DIAGNOSTIC FINDINGS:  Xray 07/27/23 - results not available   PATIENT SURVEYS:  Modified Oswestry 33/50; 66%   COGNITION: Overall cognitive status: Within functional limits for tasks assessed     SENSATION:  Numbness and tingling deep R buttock; down R LE to toes    MUSCLE LENGTH: Hamstrings: Right 45 deg; Left 50 deg Thomas test: tight R > L   POSTURE: rounded shoulders, forward head, and flexed trunk   PALPATION: Pain with PA mobs Grade II/III lumbar; R SI to lateral sacral border; piriformis; glut med/min; R > L iliopsoas   LUMBAR ROM:   AROM eval  Flexion 60% pulling B LE's posterior thigh  Extension 30% discomfort  Right lateral flexion 55%  Left lateral flexion 45% pain R LB/hip  Right rotation 40%  Left rotation 35% pain R LB/hip    (Blank rows = not tested)  LOWER EXTREMITY ROM:     Active  Right eval Left eval  Hip flexion    Hip extension Limited  Tight   Hip abduction    Hip adduction    Hip internal rotation Tight    Hip external rotation Tight    Knee flexion    Knee extension    Ankle dorsiflexion    Ankle plantarflexion    Ankle inversion    Ankle eversion     (Blank rows = not tested)  LOWER EXTREMITY MMT:    MMT Right eval Left eval  Hip flexion 4 4+  Hip extension 4- 4  Hip abduction 4 5  Hip adduction    Hip internal rotation    Hip external rotation    Knee flexion    Knee extension    Ankle dorsiflexion    Ankle plantarflexion    Ankle inversion    Ankle eversion     (Blank rows = not  tested)  LUMBAR SPECIAL TESTS:  Straight leg raise test: Positive R with increased R LE radicular symptoms; Slump test: Negative  FUNCTIONAL  TESTS:  5 times sit to stand: 23.61 sec use of UE's for stand to sit and last 2 reps min use of UE's sit to stand  SLS - 10 sec more difficult on R   GAIT: Distance walked: 40 ft Assistive device utilized: None Level of assistance: Complete Independence Comments: WFL's   TREATMENT DATE: 08/01/23  Review of evaluation findings; pathology; POC Exercises per POC Education re extension principle  Self care re-positions for sitting, lying down, sleeping                                                                                                                                 PATIENT EDUCATION:  Education details: POC; HEP Person educated: Patient Education method: Explanation, Demonstration, Tactile cues, Verbal cues, and Handouts Education comprehension: verbalized understanding, returned demonstration, verbal cues required, tactile cues required, and needs further education  HOME EXERCISE PROGRAM: Access Code: [email or web address] URL: [email or web address] Date: 08/01/2023 Prepared by: Corlis Leak  Exercises - Cat Cow  - 2 x daily - 7 x weekly - 1 sets - 5-10 reps - 3-5 sec  hold - Prone Press Up On Elbows  - 2 x daily - 7 x weekly - 1 sets - 2 reps - 1-5 min  hold - Supine Sciatic Nerve Glide  - 2 x daily - 7 x weekly - 1 sets - 8-10 reps - 1-2 sec  hold - Supine Transversus Abdominis Bracing with Pelvic Floor Contraction  - 2 x daily - 7 x weekly - 1 sets - 10 reps - 10sec  hold - Supine Diaphragmatic Breathing  - 2 x daily - 7 x weekly - 1 sets - 10 reps - 4-6 sec  hold  Patient Education - Hospital doctor - Office Posture  ASSESSMENT:  CLINICAL IMPRESSION: Patient is a 58 y.o. female who was seen today for physical therapy evaluation and treatment for R lumbar radiculitis. She reports no specific injury but may have irritated LB with on line exercise program ~ 5 weeks ago. She further irritated symptoms ~ 2 weeks ago putting mulch in her yard. Patient  presents with limited trunk and LE mobility/ROM; decreased strength R LE possibly due to pain and abnormal movement; muscular tightness to palpation; radicular pain R LE; poor movement patterns; decrease endurance for ADL's and functional activities. Patient will benefit from physical therapy to address problems identified. Patient responded well to initial intervention reporting decreased radicular pain and improved mobility.   OBJECTIVE IMPAIRMENTS: Abnormal gait, decreased activity tolerance, decreased balance, decreased mobility, decreased ROM, decreased strength, increased fascial restrictions, improper body mechanics, postural dysfunction, and pain.   ACTIVITY LIMITATIONS: carrying, lifting, bending, sitting, standing, squatting, sleeping, stairs, transfers, bed mobility, and locomotion level  PARTICIPATION LIMITATIONS: meal prep, cleaning, laundry, driving, community activity, occupation, and yard  work  PERSONAL FACTORS: Behavior pattern, Fitness, Past/current experiences, Profession, Time since onset of injury/illness/exacerbation, and comorbidities: sitting 30 in for commute to work; unlevel walking path are also affecting patient's functional outcome.   REHAB POTENTIAL: Good  CLINICAL DECISION MAKING: Evolving/moderate complexity  EVALUATION COMPLEXITY: Moderate   GOALS: Goals reviewed with patient? Yes  SHORT TERM GOALS: Target date: 08/29/2023   Independent in initial HEP  Baseline: Goal status: INITIAL  2.  Decrease R LE radicular symptoms by 50%  Baseline:  Goal status: INITIAL  3.  Patient will demonstrate and verbalize proper techniques for back care principles  Baseline:  Goal status: INITIAL   LONG TERM GOALS: Target date: 09/26/2023   Decrease pain by 75-100% allowing patient to return to normal functional activities  Baseline:  Goal status: INITIAL  2.  LE strength 4+/5 to 5/5  Baseline:  Goal status: INITIAL  3.  Lumbar ROM WFL and pain free   Baseline:  Goal status: INITIAL  4.  Patient tolerates sitting, standing, walking for >/= 1 hour with no increase in LBP or radicular pain  Baseline:  Goal status: INITIAL  5.  Independent in HEP including aquatic program as indicated  Baseline:  Goal status: INITIAL  6.  Improve Owestry by 10-20 points  Baseline: 33/50; 66% Goal status: INITIAL  PLAN:  PT FREQUENCY: 2x/week  PT DURATION: 8 weeks  PLANNED INTERVENTIONS: 97110-Therapeutic exercises, 97530- Therapeutic activity, 97112- Neuromuscular re-education, 97535- Self Care, 53664- Manual therapy, [phone]- Aquatic Therapy, Patient/Family education, Balance training, Stair training, Taping, Dry Needling, Joint mobilization, Cryotherapy, and Moist heat.  PLAN FOR NEXT SESSION: review and progress exercises; continue with back care and spine education; manual work and modalities as indicated    Margarito Dehaas P Aleshia Cartelli, PT 08/01/2023, 8:07 PM   For all possible CPT codes, reference the Planned Interventions line above.     Check all conditions that are expected to impact treatment: {Conditions expected to impact treatment:Musculoskeletal disorders   If treatment provided at initial evaluation, no treatment charged due to lack of authorization.

## 2023-08-01 ENCOUNTER — Other Ambulatory Visit: Payer: Self-pay

## 2023-08-01 ENCOUNTER — Ambulatory Visit: Attending: Sports Medicine | Admitting: Rehabilitative and Restorative Service Providers"

## 2023-08-01 ENCOUNTER — Encounter: Payer: Self-pay | Admitting: Rehabilitative and Restorative Service Providers"

## 2023-08-01 DIAGNOSIS — R29898 Other symptoms and signs involving the musculoskeletal system: Secondary | ICD-10-CM | POA: Insufficient documentation

## 2023-08-01 DIAGNOSIS — M5416 Radiculopathy, lumbar region: Secondary | ICD-10-CM | POA: Insufficient documentation

## 2023-08-07 ENCOUNTER — Encounter: Payer: Self-pay | Admitting: Sports Medicine

## 2023-08-09 ENCOUNTER — Ambulatory Visit: Admitting: Rehabilitative and Restorative Service Providers"

## 2023-08-09 ENCOUNTER — Encounter: Payer: Self-pay | Admitting: Rehabilitative and Restorative Service Providers"

## 2023-08-09 DIAGNOSIS — R29898 Other symptoms and signs involving the musculoskeletal system: Secondary | ICD-10-CM

## 2023-08-09 DIAGNOSIS — M5416 Radiculopathy, lumbar region: Secondary | ICD-10-CM

## 2023-08-09 NOTE — Therapy (Signed)
 OUTPATIENT PHYSICAL THERAPY THORACOLUMBAR EVALUATION   Patient Name: Kara Cooper MRN: [id number] DOB:[birth year], 58 y.o., female Today's Date: 08/09/2023  END OF SESSION:  PT End of Session - 08/09/23 0837     Visit Number 2    Number of Visits 16    Date for PT Re-Evaluation 09/26/23    Authorization Type BCBS co pay  - none    Authorization Time Period year    Authorization - Visit Number 2    Authorization - Number of Visits 29    PT Start Time [phone]    PT Stop Time 0922    PT Time Calculation (min) 45 min    Activity Tolerance Patient tolerated treatment well             Past Medical History:  Diagnosis Date   Acute right pyelonephritis 04/04/2017   Anemia    in past   Basal cell carcinoma    treated with Moh's procedure   Chicken pox    in past   Headache(784.0)    frequent   History of seasonal allergies    Irregular menses    Migraine    Other and unspecified ovarian cysts    come and go   Postural orthostatic tachycardia syndrome    Dr. Arlester Ladd   UTI (lower urinary tract infection)    history of   Past Surgical History:  Procedure Laterality Date   CERVICAL CONIZATION W/BX  1987, 1988   DILATION AND CURETTAGE OF UTERUS     Patient Active Problem List   Diagnosis Date Noted   Right lumbar radiculitis 07/27/2023   Pulsatile tinnitus of both ears 10/04/2022   Ventricular tachycardia (HCC) 09/01/2022   Generalized anxiety disorder 03/29/2022   Elevated troponin 03/15/2022   UTI (urinary tract infection) 03/15/2022   Anxiety 03/15/2022   Insomnia 03/15/2022   GERD (gastroesophageal reflux disease) 03/15/2022   SVT (supraventricular tachycardia) (HCC) 03/14/2022   Herpes simplex 09/19/2019   Weight gain 09/28/2018   Arthralgia of both hands 01/18/2018   Hypothyroidism due to Hashimoto's thyroiditis 07/05/2017   Chronic fatigue 05/17/2017   Fracture of coronoid process of ulna, left, closed 01/24/2017   Stress reaction 09/27/2013   Routine  general medical examination at a health care facility 12/19/2012   Syncope/dysautomia 01/03/2011   Allergic rhinitis 12/29/2008   Migraine with aura 06/19/2008    PCP: Cherre Cornish, NP  REFERRING PROVIDER: Dr Annemarie Kil  REFERRING DIAG: R lumbar radiculitis  Rationale for Evaluation and Treatment: Rehabilitation  THERAPY DIAG:  Right lumbar radiculitis  Other symptoms and signs involving the musculoskeletal system  ONSET DATE: 06/17/23  SUBJECTIVE:  SUBJECTIVE STATEMENT: Symptoms are improving. Pain intensity is decreased and intermittent instead of constant. She is walking a about a mile and half without symptoms. She is working on the exercises at home    EVAL: Patient reports that she started having pain in the R LB and down the R LE ~ 5 weeks ago which may have been due to on line exercises but she is not sure. About 2 weeks ago she was putting mulch in the yard and noticed increased pain after that. Has increased pain with walking up hills or up stairs. Pain has continued in the R LB and LE but she is now also having pain in L LB.  PERTINENT HISTORY:  Denies prior LBP; SBT; hashimoto's disease; fx of L ulnar head 2019  PAIN:  Are you having pain? Yes: NPRS scale: 2-3/10 Pain location: R > L LB and into R LE posterior/lateral R thigh to posterior calf; foot and lateral three toes  Pain description: sharp; throbbing at times Aggravating factors: walking up hill; stairs; driving; sitting  Relieving factors: nothing   PRECAUTIONS: None   WEIGHT BEARING RESTRICTIONS: No  FALLS:  Has patient fallen in last 6 months? 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  DIAGNOSTIC FINDINGS:  Xray 07/27/23 - results not available   PATIENT SURVEYS:  Modified Oswestry 33/50; 66%    SENSATION:  Numbness and tingling deep R buttock; down R LE to toes    MUSCLE LENGTH: Hamstrings: Right 45 deg; Left 50 deg Thomas test: tight R > L   POSTURE: rounded shoulders, forward head, and flexed trunk   PALPATION: Pain with PA mobs Grade II/III lumbar; R SI to lateral sacral border; piriformis; glut med/min; R > L iliopsoas   LUMBAR ROM:   AROM eval  Flexion 60% pulling B LE's posterior thigh  Extension 30% discomfort  Right lateral flexion 55%  Left lateral flexion 45% pain R LB/hip  Right rotation 40%  Left rotation 35% pain R LB/hip    (Blank rows = not tested)  LOWER EXTREMITY ROM:     Active  Right eval Left eval  Hip flexion    Hip extension Limited  Tight   Hip abduction    Hip adduction    Hip internal rotation Tight    Hip external rotation Tight    Knee flexion    Knee extension    Ankle dorsiflexion    Ankle plantarflexion    Ankle inversion    Ankle eversion     (Blank rows = not tested)  LOWER EXTREMITY MMT:    MMT Right eval Left eval  Hip flexion 4 4+  Hip extension 4- 4  Hip abduction 4 5  Hip adduction    Hip internal rotation    Hip external rotation    Knee flexion    Knee extension    Ankle dorsiflexion    Ankle plantarflexion  Ankle inversion    Ankle eversion     (Blank rows = not tested)  LUMBAR SPECIAL TESTS:  Straight leg raise test:  Positive R with increased R LE radicular symptoms; Slump test: Negative  FUNCTIONAL TESTS:  5 times sit to stand: 23.61 sec use of UE's for stand to sit and last 2 reps min use of UE's sit to stand  SLS - 10 sec more difficult on R   GAIT: Distance walked: 40 ft Assistive device utilized: None Level of assistance: Complete Independence Comments: WFL's   [facility] Adult PT Treatment:                                                DATE: 08/09/23 Therapeutic Exercise: Prone Prone prop 2-3 min  Prone press up partial range 2-3 sec hold x 10  Quadruped  Cat cow x 10  Childs pose 20 sec x 4  Supine Diaphragmatic breathing  4 part core  Sciatic nerve glide 2-3 sec x 10   Manual Therapy: PA mobs through lumbar spine Grande II/III STM R posterior hip through piriformis and gluts; lumbar paraspinals/lats PROM IR/ER R LE hip extended knee flexed x 10  Therapeutic Activity: Prone Prone prop 2-3 min  Prone press up partial range 2-3 sec hold x 10  Supine Piriformis stretch 30 sec x 3  Bridge 5 sec x 10  Hamstring stretch supine hooklying with strap 30 sec x 3  Sitting  Coregeous ball T spine thoracic extension hands behind head 10 sec x 3  Self Care: Myofacial ball release work standing working through the R posterior hip; sitting with ball hamstring origin  Cautioned patient to avoid overdoing exercises    TREATMENT DATE: 08/01/23  Review of evaluation findings; pathology; POC Exercises per POC Education re extension principle  Self care re-positions for sitting, lying down, sleeping                                                                                                                                 PATIENT EDUCATION:  Education details: POC; HEP Person educated: Patient Education method: Explanation, Demonstration, Tactile cues, Verbal cues, and Handouts Education comprehension: verbalized understanding, returned demonstration, verbal cues required, tactile cues  required, and needs further education  HOME EXERCISE PROGRAM: Access Code: [email or web address] URL: [email or web address] Date: 08/01/2023 Prepared by: Caydyn Sprung  Exercises - Cat Cow  - 2 x daily - 7 x weekly - 1 sets - 5-10 reps - 3-5 sec  hold - Prone Press Up On Elbows  - 2 x daily - 7 x weekly - 1 sets - 2 reps - 1-5 min  hold - Supine Sciatic Nerve Glide  - 2 x daily - 7 x weekly - 1 sets - 8-10 reps - 1-2 sec  hold - Supine  Transversus Abdominis Bracing with Pelvic Floor Contraction  - 2 x daily - 7 x weekly - 1 sets - 10 reps - 10sec  hold - Supine Diaphragmatic Breathing  - 2 x daily - 7 x weekly - 1 sets - 10 reps - 4-6 sec  hold  Patient Education - Hospital doctor - Office Posture  ASSESSMENT:  CLINICAL IMPRESSION: Significant improvement in lumbar pain and R LE radicular symptoms. Decreased frequency, intensity, duration of pain and radicular symptoms. Patient has decreased lumbar and thoracic spine mobility. Noted some improvement in mobility in cat cow following manual work and mobs as well as thoracic extension with coregeous ball. She has muscular tightness in the lumbar paraspinals; lats; QL; posterior hip - piriformis, glut med/min. Some improvement with manual work and stretching. May benefit from trial of DN through posterior hip.   EVAL: Patient is a 58 y.o. female who was seen today for physical therapy evaluation and treatment for R lumbar radiculitis. She reports no specific injury but may have irritated LB with on line exercise program ~ 5 weeks ago. She further irritated symptoms ~ 2 weeks ago putting mulch in her yard. Patient presents with limited trunk and LE mobility/ROM; decreased strength R LE possibly due to pain and abnormal movement; muscular tightness to palpation; radicular pain R LE; poor movement patterns; decrease endurance for ADL's and functional activities. Patient will benefit from physical therapy to address problems identified.  Patient responded well to initial intervention reporting decreased radicular pain and improved mobility.    GOALS: Goals reviewed with patient? Yes  SHORT TERM GOALS: Target date: 08/29/2023   Independent in initial HEP  Baseline: Goal status: INITIAL  2.  Decrease R LE radicular symptoms by 50%  Baseline:  Goal status: INITIAL  3.  Patient will demonstrate and verbalize proper techniques for back care principles  Baseline:  Goal status: INITIAL   LONG TERM GOALS: Target date: 09/26/2023   Decrease pain by 75-100% allowing patient to return to normal functional activities  Baseline:  Goal status: INITIAL  2.  LE strength 4+/5 to 5/5  Baseline:  Goal status: INITIAL  3.  Lumbar ROM WFL and pain free  Baseline:  Goal status: INITIAL  4.  Patient tolerates sitting, standing, walking for >/= 1 hour with no increase in LBP or radicular pain  Baseline:  Goal status: INITIAL  5.  Independent in HEP including aquatic program as indicated  Baseline:  Goal status: INITIAL  6.  Improve Owestry by 10-20 points  Baseline: 33/50; 66% Goal status: INITIAL  PLAN:  PT FREQUENCY: 2x/week  PT DURATION: 8 weeks  PLANNED INTERVENTIONS: 97110-Therapeutic exercises, 97530- Therapeutic activity, 97112- Neuromuscular re-education, 97535- Self Care, 66440- Manual therapy, [phone]- Aquatic Therapy, Patient/Family education, Balance training, Stair training, Taping, Dry Needling, Joint mobilization, Cryotherapy, and Moist heat.  PLAN FOR NEXT SESSION: review and progress exercises; continue with back care and spine education; manual work and modalities as indicated   Consider trial of DN R posterior hip and possibly R lumbar musculature    Delora Gravatt Hadley Leu, PT 08/09/2023, 8:37 AM   For all possible CPT codes, reference the Planned Interventions line above.     Check all conditions that are expected to impact treatment: {Conditions expected to impact treatment:Musculoskeletal  disorders   If treatment provided at initial evaluation, no treatment charged due to lack of authorization.

## 2023-08-10 ENCOUNTER — Encounter: Payer: Self-pay | Admitting: Medical-Surgical

## 2023-08-10 DIAGNOSIS — E063 Autoimmune thyroiditis: Secondary | ICD-10-CM

## 2023-08-10 DIAGNOSIS — E7800 Pure hypercholesterolemia, unspecified: Secondary | ICD-10-CM

## 2023-08-10 DIAGNOSIS — Z0000 Encounter for general adult medical examination without abnormal findings: Secondary | ICD-10-CM

## 2023-08-11 ENCOUNTER — Ambulatory Visit

## 2023-08-11 DIAGNOSIS — M5416 Radiculopathy, lumbar region: Secondary | ICD-10-CM

## 2023-08-11 DIAGNOSIS — R29898 Other symptoms and signs involving the musculoskeletal system: Secondary | ICD-10-CM | POA: Diagnosis not present

## 2023-08-11 NOTE — Therapy (Signed)
 OUTPATIENT PHYSICAL THERAPY THORACOLUMBAR TREATMENT   Patient Name: Kara Cooper MRN: [id number] DOB:1966/01/03, 58 y.o., female Today's Date: 08/11/2023  END OF SESSION:  PT End of Session - 08/11/23 0933     Visit Number 3    Number of Visits 16    Date for PT Re-Evaluation 09/26/23    Authorization Type BCBS co pay  - none    Authorization Time Period 4/16-6/14/25    Authorization - Visit Number 3    Authorization - Number of Visits 5    PT Start Time 0933    PT Stop Time 1015    PT Time Calculation (min) 42 min    Activity Tolerance Patient tolerated treatment well              Past Medical History:  Diagnosis Date   Acute right pyelonephritis 04/04/2017   Anemia    in past   Basal cell carcinoma    treated with Moh's procedure   Chicken pox    in past   Headache(784.0)    frequent   History of seasonal allergies    Irregular menses    Migraine    Other and unspecified ovarian cysts    come and go   Postural orthostatic tachycardia syndrome    Dr. Arlester Ladd   UTI (lower urinary tract infection)    history of   Past Surgical History:  Procedure Laterality Date   CERVICAL CONIZATION W/BX  1987, 1988   DILATION AND CURETTAGE OF UTERUS     Patient Active Problem List   Diagnosis Date Noted   Right lumbar radiculitis 07/27/2023   Pulsatile tinnitus of both ears 10/04/2022   Ventricular tachycardia (HCC) 09/01/2022   Generalized anxiety disorder 03/29/2022   Elevated troponin 03/15/2022   UTI (urinary tract infection) 03/15/2022   Anxiety 03/15/2022   Insomnia 03/15/2022   GERD (gastroesophageal reflux disease) 03/15/2022   SVT (supraventricular tachycardia) (HCC) 03/14/2022   Herpes simplex 09/19/2019   Weight gain 09/28/2018   Arthralgia of both hands 01/18/2018   Hypothyroidism due to Hashimoto's thyroiditis 07/05/2017   Chronic fatigue 05/17/2017   Fracture of coronoid process of ulna, left, closed 01/24/2017   Stress reaction 09/27/2013    Routine general medical examination at a health care facility 12/19/2012   Syncope/dysautomia 01/03/2011   Allergic rhinitis 12/29/2008   Migraine with aura 06/19/2008    PCP: Cherre Cornish, NP  REFERRING PROVIDER: Dr Annemarie Kil  REFERRING DIAG: R lumbar radiculitis  Rationale for Evaluation and Treatment: Rehabilitation  THERAPY DIAG:  Right lumbar radiculitis  Other symptoms and signs involving the musculoskeletal system  ONSET DATE: 06/17/23  SUBJECTIVE:  SUBJECTIVE STATEMENT: Patient reports she is feeling deep pain in her hip that has gotten better, but has not gone away. She reports HEP is going well.    EVAL: Patient reports that she started having pain in the R LB and down the R LE ~ 5 weeks ago which may have been due to on line exercises but she is not sure. About 2 weeks ago she was putting mulch in the yard and noticed increased pain after that. Has increased pain with walking up hills or up stairs. Pain has continued in the R LB and LE but she is now also having pain in L LB.  PERTINENT HISTORY:  Denies prior LBP; SBT; hashimoto's disease; fx of L ulnar head 2019  PAIN:  Are you having pain? Yes: NPRS scale: 4-5/10 Pain location: R > L LB and into R LE posterior/lateral R thigh to posterior calf; foot and lateral three toes  Pain description: deep, pulling/stretching  Aggravating factors: walking up hill; stairs; driving; sitting  Relieving factors: nothing   PRECAUTIONS: None   WEIGHT BEARING RESTRICTIONS: No  FALLS:  Has patient fallen in last 6 months? 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  DIAGNOSTIC FINDINGS:  Xray 07/27/23 - results not available   PATIENT SURVEYS:  Modified Oswestry 33/50; 66%    SENSATION:  Numbness and tingling deep R buttock; down R LE to toes    MUSCLE LENGTH: Hamstrings: Right 45 deg; Left 50 deg Thomas test: tight R > L   POSTURE: rounded shoulders, forward head, and flexed trunk   PALPATION: Pain with PA mobs Grade II/III lumbar; R SI to lateral sacral border; piriformis; glut med/min; R > L iliopsoas   LUMBAR ROM:   AROM eval  Flexion 60% pulling B LE's posterior thigh  Extension 30% discomfort  Right lateral flexion 55%  Left lateral flexion 45% pain R LB/hip  Right rotation 40%  Left rotation 35% pain R LB/hip    (Blank rows = not tested)  LOWER EXTREMITY ROM:     Active  Right eval Left eval  Hip flexion    Hip extension Limited  Tight   Hip abduction    Hip adduction    Hip internal rotation Tight    Hip external rotation Tight    Knee flexion    Knee extension    Ankle dorsiflexion    Ankle plantarflexion    Ankle inversion    Ankle eversion     (Blank rows = not tested)  LOWER EXTREMITY MMT:    MMT Right eval Left eval  Hip flexion 4 4+  Hip extension 4- 4  Hip abduction 4 5  Hip adduction    Hip internal rotation    Hip external rotation    Knee flexion    Knee extension    Ankle dorsiflexion    Ankle plantarflexion    Ankle inversion  Ankle eversion     (Blank rows = not tested)  LUMBAR SPECIAL TESTS:  Straight leg raise test: Positive R with increased R LE radicular symptoms; Slump  test: Negative  FUNCTIONAL TESTS:  5 times sit to stand: 23.61 sec use of UE's for stand to sit and last 2 reps min use of UE's sit to stand  SLS - 10 sec more difficult on R   GAIT: Distance walked: 40 ft Assistive device utilized: None Level of assistance: Complete Independence Comments: WFL's  [facility] Adult PT Treatment:                                                DATE: 08/11/23 Therapeutic Exercise: Prone pressup x 10  Standing lumbar extension 2 x 10  Sciatic nerve glide x 10  Updated HEP  Manual Therapy: STM Rt gluteals/piriformis  Prone pressup with lumbar overpressure x 10  LAD x 1 min each   Self Care: Educated on TPDN indications, side effects.  Posture and positioning to avoid (excessive trunk flexion)   [facility] Adult PT Treatment:                                                DATE: 08/09/23 Therapeutic Exercise: Prone Prone prop 2-3 min  Prone press up partial range 2-3 sec hold x 10  Quadruped  Cat cow x 10  Childs pose 20 sec x 4  Supine Diaphragmatic breathing  4 part core  Sciatic nerve glide 2-3 sec x 10   Manual Therapy: PA mobs through lumbar spine Grande II/III STM R posterior hip through piriformis and gluts; lumbar paraspinals/lats PROM IR/ER R LE hip extended knee flexed x 10  Therapeutic Activity: Prone Prone prop 2-3 min  Prone press up partial range 2-3 sec hold x 10  Supine Piriformis stretch 30 sec x 3  Bridge 5 sec x 10  Hamstring stretch supine hooklying with strap 30 sec x 3  Sitting  Coregeous ball T spine thoracic extension hands behind head 10 sec x 3  Self Care: Myofacial ball release work standing working through the R posterior hip; sitting with ball hamstring origin  Cautioned patient to avoid overdoing exercises    TREATMENT DATE: 08/01/23  Review of evaluation findings; pathology; POC Exercises per POC Education re extension principle  Self care re-positions for sitting, lying down, sleeping                                                                                                                                  PATIENT EDUCATION:  Education details: TPDN, HEP Person educated: Patient Education method: Explanation and Handouts, demo, cues Education comprehension:  verbalized understanding, returned demo, cues   HOME EXERCISE PROGRAM: Access Code: [email or web address] URL: [email or web address] Date: 08/11/2023 Prepared by: Forrestine Ike  Exercises - Cat Cow  - 2 x daily - 7 x weekly - 1 sets - 5-10 reps - 3-5 sec  hold - Prone Press Up On Elbows  - 2 x daily - 7 x weekly - 1 sets - 2 reps - 1-5 min  hold - Supine Sciatic Nerve Glide  - 2 x daily - 7 x weekly - 1 sets - 8-10 reps - 1-2 sec  hold - Supine Transversus Abdominis Bracing with Pelvic Floor Contraction  - 2 x daily - 7 x weekly - 1 sets - 10 reps - 10sec  hold - Supine Diaphragmatic Breathing  - 2 x daily - 7 x weekly - 1 sets - 10 reps - 4-6 sec  hold - Prone Press Up  - 2 x daily - 7 x weekly - 1 sets - 10 reps - 2-3 sec  hold - Cat Cow to Child's Pose  - 2 x daily - 7 x weekly - 1 sets - 3 reps - 30 sec  hold - Supine Piriformis Stretch with Leg Straight  - 2 x daily - 7 x weekly - 1 sets - 3 reps - 30 sec  hold - Seated Thoracic Lumbar Extension  - 2 x daily - 7 x weekly - 1 sets - 3-5 reps - 5-10 sec  hold - Hooklying Hamstring Stretch with Strap  - 2 x daily - 7 x weekly - 1 sets - 3 reps - 30 sec  hold - Supine Bridge  - 2 x daily - 7 x weekly - 1-2 sets - 10 reps - 5-10 sec  hold - Standing Lumbar Extension with Counter  - 6 x daily - 7 x weekly - 1 sets - 10 reps  ASSESSMENT:  CLINICAL IMPRESSION: Patient arrives with moderate RLE pain. We discussed TPDN, though patient would like to try other interventions before considering TPDN at this time. Informational handout was provided. With prone extension she has very limited mobility about the lumbar spine and this has little effect on her radicular pain. With standing lumbar  extension she has increased range and reports a reduction in hip and thigh pain rated as 2/10 following this repeated movement. With LAD it relieved posterior hip and thigh pain, but caused tingling sensation about anterior thigh. HEP updated to include repeated lumbar extension in standing.   EVAL: Patient is a 58 y.o. female who was seen today for physical therapy evaluation and treatment for R lumbar radiculitis. She reports no specific injury but may have irritated LB with on line exercise program ~ 5 weeks ago. She further irritated symptoms ~ 2 weeks ago putting mulch in her yard. Patient presents with limited trunk and LE mobility/ROM; decreased strength R LE possibly due to pain and abnormal movement; muscular tightness to palpation; radicular pain R LE; poor movement patterns; decrease endurance for ADL's and functional activities. Patient will benefit from physical therapy to address problems identified. Patient responded well to initial intervention reporting decreased radicular pain and improved mobility.    GOALS: Goals reviewed with patient? Yes  SHORT TERM GOALS: Target date: 08/29/2023   Independent in initial HEP  Baseline: Goal status: INITIAL  2.  Decrease R LE radicular symptoms by 50%  Baseline:  Goal status: INITIAL  3.  Patient will demonstrate and verbalize proper techniques for back care principles  Baseline:  Goal  status: INITIAL   LONG TERM GOALS: Target date: 09/26/2023   Decrease pain by 75-100% allowing patient to return to normal functional activities  Baseline:  Goal status: INITIAL  2.  LE strength 4+/5 to 5/5  Baseline:  Goal status: INITIAL  3.  Lumbar ROM WFL and pain free  Baseline:  Goal status: INITIAL  4.  Patient tolerates sitting, standing, walking for >/= 1 hour with no increase in LBP or radicular pain  Baseline:  Goal status: INITIAL  5.  Independent in HEP including aquatic program as indicated  Baseline:  Goal status:  INITIAL  6.  Improve Owestry by 10-20 points  Baseline: 33/50; 66% Goal status: INITIAL  PLAN:  PT FREQUENCY: 2x/week  PT DURATION: 8 weeks  PLANNED INTERVENTIONS: 97110-Therapeutic exercises, 97530- Therapeutic activity, 97112- Neuromuscular re-education, 97535- Self Care, [id number]- Manual therapy, [phone]- Aquatic Therapy, Patient/Family education, Balance training, Stair training, Taping, Dry Needling, Joint mobilization, Cryotherapy, and Moist heat.  PLAN FOR NEXT SESSION: review and progress exercises; continue with back care and spine education; manual work and modalities as indicated    Alexandria Shiflett, PT, DPT, ATC 08/11/23 10:18 AM

## 2023-08-11 NOTE — Patient Instructions (Signed)

## 2023-08-14 ENCOUNTER — Ambulatory Visit: Admitting: Rehabilitative and Restorative Service Providers"

## 2023-08-14 ENCOUNTER — Encounter: Payer: Self-pay | Admitting: Rehabilitative and Restorative Service Providers"

## 2023-08-14 DIAGNOSIS — M5416 Radiculopathy, lumbar region: Secondary | ICD-10-CM

## 2023-08-14 DIAGNOSIS — R29898 Other symptoms and signs involving the musculoskeletal system: Secondary | ICD-10-CM | POA: Diagnosis not present

## 2023-08-14 NOTE — Therapy (Signed)
 OUTPATIENT PHYSICAL THERAPY THORACOLUMBAR TREATMENT   Patient Name: Kara Cooper MRN: [id number] DOB:Dec 05, 1965, 58 y.o., female Today's Date: 08/14/2023  END OF SESSION:  PT End of Session - 08/14/23 1532     Visit Number 4    Number of Visits 16    Date for PT Re-Evaluation 09/26/23    Authorization Type [employer] co pay  - none    Authorization Time Period 4/16-6/14/25    Authorization - Visit Number 4    Authorization - Number of Visits 5    PT Start Time 1530    PT Stop Time 1615    PT Time Calculation (min) 45 min    Activity Tolerance Patient tolerated treatment well              Past Medical History:  Diagnosis Date   Acute right pyelonephritis 04/04/2017   Anemia    in past   Basal cell carcinoma    treated with Moh's procedure   Chicken pox    in past   Headache(784.0)    frequent   History of seasonal allergies    Irregular menses    Migraine    Other and unspecified ovarian cysts    come and go   Postural orthostatic tachycardia syndrome    Dr. Arlester Ladd   UTI (lower urinary tract infection)    history of   Past Surgical History:  Procedure Laterality Date   CERVICAL CONIZATION W/BX  1987, 1988   DILATION AND CURETTAGE OF UTERUS     Patient Active Problem List   Diagnosis Date Noted   Right lumbar radiculitis 07/27/2023   Pulsatile tinnitus of both ears 10/04/2022   Ventricular tachycardia (HCC) 09/01/2022   Generalized anxiety disorder 03/29/2022   Elevated troponin 03/15/2022   UTI (urinary tract infection) 03/15/2022   Anxiety 03/15/2022   Insomnia 03/15/2022   GERD (gastroesophageal reflux disease) 03/15/2022   SVT (supraventricular tachycardia) (HCC) 03/14/2022   Herpes simplex 09/19/2019   Weight gain 09/28/2018   Arthralgia of both hands 01/18/2018   Hypothyroidism due to Hashimoto's thyroiditis 07/05/2017   Chronic fatigue 05/17/2017   Fracture of coronoid process of ulna, left, closed 01/24/2017   Stress reaction 09/27/2013    Routine general medical examination at a health care facility 12/19/2012   Syncope/dysautomia 01/03/2011   Allergic rhinitis 12/29/2008   Migraine with aura 06/19/2008    PCP: Cherre Cornish, NP  REFERRING PROVIDER: Dr Annemarie Kil  REFERRING DIAG: R lumbar radiculitis  Rationale for Evaluation and Treatment: Rehabilitation  THERAPY DIAG:  Right lumbar radiculitis  Other symptoms and signs involving the musculoskeletal system  ONSET DATE: 06/17/23  SUBJECTIVE:  SUBJECTIVE STATEMENT: Patient reports she is having more pain in the R posterior hip and some pain into the R LE again yesterday and worse today. Having the deep pain in the R hip that had gotten better, but now is back. She reports being much more active - driving a lot Saturday; working some in the yard and garden, then had her children and grandchildren at her house yesterday.    EVAL: Patient reports that she started having pain in the R LB and down the R LE ~ 5 weeks ago which may have been due to on line exercises but she is not sure. About 2 weeks ago she was putting mulch in the yard and noticed increased pain after that. Has increased pain with walking up hills or up stairs. Pain has continued in the R LB and LE but she is now also having pain in L LB.  PERTINENT HISTORY:  Denies prior LBP; SBT; hashimoto's disease; fx of L ulnar head 2019  PAIN:  Are you having pain? Yes: NPRS scale: 8/10 Pain location: R > L LB and into R LE posterior/lateral R thigh to posterior calf; foot and lateral three toes  Pain description: deep, pulling/stretching  Aggravating factors: walking up hill; stairs; driving; sitting  Relieving factors: nothing   PRECAUTIONS: None   WEIGHT BEARING RESTRICTIONS: No  FALLS:  Has patient fallen in last 6  months? 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  DIAGNOSTIC FINDINGS:  Xray 07/27/23 - results not available   PATIENT SURVEYS:  Modified Oswestry 33/50; 66%    SENSATION:  Numbness and tingling deep R buttock; down R LE to toes    MUSCLE LENGTH: Hamstrings: Right 45 deg; Left 50 deg Thomas test: tight R > L   POSTURE: rounded shoulders, forward head, and flexed trunk   PALPATION: Pain with PA mobs Grade II/III lumbar; R SI to lateral sacral border; piriformis; glut med/min; R > L iliopsoas   LUMBAR ROM:   AROM eval  Flexion 60% pulling B LE's posterior thigh  Extension 30% discomfort  Right lateral flexion 55%  Left lateral flexion 45% pain R LB/hip  Right rotation 40%  Left rotation 35% pain R LB/hip    (Blank rows = not tested)  LOWER EXTREMITY ROM:     Active  Right eval Left eval  Hip flexion    Hip extension Limited  Tight   Hip abduction    Hip adduction    Hip internal rotation Tight    Hip external rotation Tight    Knee flexion    Knee extension    Ankle dorsiflexion    Ankle plantarflexion    Ankle inversion    Ankle eversion     (Blank rows = not tested)  LOWER EXTREMITY MMT:    MMT Right eval Left eval  Hip flexion 4 4+  Hip extension 4- 4  Hip abduction 4 5  Hip adduction    Hip internal rotation    Hip external rotation    Knee flexion    Knee extension     Ankle dorsiflexion    Ankle plantarflexion    Ankle inversion    Ankle eversion     (Blank rows = not tested)  LUMBAR SPECIAL TESTS:  Straight leg raise test: Positive R with increased R LE radicular symptoms; Slump test: Negative  FUNCTIONAL TESTS:  5 times sit to stand: 23.61 sec use of UE's for stand to sit and last 2 reps min use of UE's sit to stand  SLS - 10 sec more difficult on R   GAIT: Distance walked: 40 ft Assistive device utilized: None Level of assistance: Complete Independence Comments: WFL's   [facility] Adult PT Treatment:                                                DATE: 08/14/23 Therapeutic Exercise: Prone Prone prop 2-3 min  Prone press up partial range 2-3 sec hold x 10  Standing  Extension x 10  Supine Diaphragmatic breathing  4 part core  Sciatic nerve glide 2-3 sec x 10 R Manual Therapy: PA mobs through lumbar spine Grande II/III STM R posterior hip through piriformis and gluts; lumbar paraspinals/lats PROM IR/ER R LE hip extended knee flexed x 10  Therapeutic Activity: Prone Prone prop 2-3 min  Prone press up partial range 2-3 sec hold x 10  Prone press up with overpressure by PT x 5  Supine Piriformis stretch 30 sec x 3  Bridge 5 sec x 10 (some pain w/ first rep - resolved with repetitions) Hamstring stretch supine hooklying with strap 30 sec x 3 R  Sitting  Coregeous ball T spine thoracic extension hands behind head 10 sec x 3  Self Care: Myofacial ball release work standing working through the R posterior hip; sitting with ball hamstring origin  Education re- contributory activities and positions that irritate LB and R LE symptoms - avoid sitting with legs crossed; avoid forward flexed postures and positions; avoid sitting in soft sofa/chairs   [facility] Adult PT Treatment:                                                DATE: 08/11/23 Therapeutic Exercise: Prone pressup x 10  Standing lumbar extension 2 x 10  Sciatic nerve glide x 10   Updated HEP  Manual Therapy: STM Rt gluteals/piriformis  Prone pressup with lumbar overpressure x 10  LAD x 1 min each   Self Care: Educated on TPDN indications, side effects.  Posture and positioning to avoid (excessive trunk flexion)   [facility] Adult PT Treatment:                                                DATE: 08/09/23 Therapeutic Exercise: Prone Prone prop 2-3 min  Prone press up partial range 2-3 sec hold x 10  Quadruped  Cat cow x 10  Childs pose 20 sec x 4  Supine Diaphragmatic breathing  4 part core  Sciatic nerve glide  2-3 sec x 10   Manual Therapy: PA mobs through lumbar spine Grande II/III STM R posterior hip through piriformis and gluts; lumbar paraspinals/lats PROM IR/ER R LE hip extended knee flexed x 10  Therapeutic Activity: Prone Prone prop 2-3 min  Prone press up partial range 2-3 sec hold x 10  Supine Piriformis stretch 30 sec x 3  Bridge 5 sec x 10  Hamstring stretch supine hooklying with strap 30 sec x 3  Sitting  Coregeous ball T spine thoracic extension hands behind head 10 sec x 3  Self Care: Myofacial ball release work standing working through the R posterior hip; sitting with ball hamstring origin  Cautioned patient to avoid overdoing exercises    TREATMENT DATE: 08/01/23  Review of evaluation findings; pathology; POC Exercises per POC Education re extension principle  Self care re-positions for sitting, lying down, sleeping                                                                                                                                 PATIENT EDUCATION:  Education details: TPDN, HEP Person educated: Patient Education method: Explanation and Handouts, demo, cues Education comprehension: verbalized understanding, returned demo, cues   HOME EXERCISE PROGRAM: Access Code: [email or web address] URL: [email or web address] Date: 08/11/2023 Prepared by: Forrestine Ike  Exercises - Cat Cow  - 2 x daily - 7 x weekly - 1  sets - 5-10 reps - 3-5 sec  hold - Prone Press Up On Elbows  - 2 x daily - 7 x weekly - 1 sets - 2 reps - 1-5 min  hold - Supine Sciatic Nerve Glide  - 2 x daily - 7 x weekly - 1 sets - 8-10 reps - 1-2 sec  hold - Supine Transversus Abdominis Bracing with Pelvic Floor Contraction  - 2 x daily - 7 x weekly - 1 sets - 10 reps - 10sec  hold - Supine Diaphragmatic Breathing  - 2 x daily - 7 x weekly - 1 sets - 10 reps - 4-6 sec  hold - Prone Press Up  - 2 x daily - 7 x weekly - 1 sets - 10 reps - 2-3 sec  hold - Cat Cow to Child's Pose  - 2 x daily - 7 x weekly - 1 sets - 3 reps - 30 sec  hold - Supine Piriformis Stretch with Leg Straight  - 2 x daily - 7 x weekly - 1 sets - 3 reps - 30 sec  hold - Seated Thoracic Lumbar Extension  - 2 x daily - 7 x weekly - 1 sets - 3-5 reps - 5-10 sec  hold - Hooklying Hamstring Stretch with Strap  - 2 x daily - 7 x weekly - 1 sets - 3 reps - 30 sec  hold - Supine Bridge  - 2 x daily - 7 x weekly - 1-2 sets - 10 reps - 5-10 sec  hold - Standing  Lumbar Extension with Counter  - 6 x daily - 7 x weekly - 1 sets - 10 reps  ASSESSMENT:  CLINICAL IMPRESSION: Patient returns reporting significant flare up of back and R LE pain yesterday and today. She does recall increased activities that irritate symptoms Friday, Saturday, Sunday that placed her in flexed positions. Patient continued with extension program as well as stretching and strengthening program. Manual work through lumbar spine and R posterior hip. Good response to treatment with symptoms in R LE resolving - just feeling "sore" following treatment. Pain reported at 3/10. Again discussed TPDN for R posterior hip. Patient would like to try the dry needling. No new exercises today. Will progress with core strengthening at next visit as tolerated.   EVAL: Patient is a 58 y.o. female who was seen today for physical therapy evaluation and treatment for R lumbar radiculitis. She reports no specific injury but may have  irritated LB with on line exercise program ~ 5 weeks ago. She further irritated symptoms ~ 2 weeks ago putting mulch in her yard. Patient presents with limited trunk and LE mobility/ROM; decreased strength R LE possibly due to pain and abnormal movement; muscular tightness to palpation; radicular pain R LE; poor movement patterns; decrease endurance for ADL's and functional activities. Patient will benefit from physical therapy to address problems identified. Patient responded well to initial intervention reporting decreased radicular pain and improved mobility.    GOALS: Goals reviewed with patient? Yes  SHORT TERM GOALS: Target date: 08/29/2023   Independent in initial HEP  Baseline: Goal status: INITIAL  2.  Decrease R LE radicular symptoms by 50%  Baseline:  Goal status: INITIAL  3.  Patient will demonstrate and verbalize proper techniques for back care principles  Baseline:  Goal status: INITIAL   LONG TERM GOALS: Target date: 09/26/2023   Decrease pain by 75-100% allowing patient to return to normal functional activities  Baseline:  Goal status: INITIAL  2.  LE strength 4+/5 to 5/5  Baseline:  Goal status: INITIAL  3.  Lumbar ROM WFL and pain free  Baseline:  Goal status: INITIAL  4.  Patient tolerates sitting, standing, walking for >/= 1 hour with no increase in LBP or radicular pain  Baseline:  Goal status: INITIAL  5.  Independent in HEP including aquatic program as indicated  Baseline:  Goal status: INITIAL  6.  Improve Owestry by 10-20 points  Baseline: 33/50; 66% Goal status: INITIAL  PLAN:  PT FREQUENCY: 2x/week  PT DURATION: 8 weeks  PLANNED INTERVENTIONS: 97110-Therapeutic exercises, 97530- Therapeutic activity, 97112- Neuromuscular re-education, 97535- Self Care, [id number]- Manual therapy, [phone]- Aquatic Therapy, Patient/Family education, Balance training, Stair training, Taping, Dry Needling, Joint mobilization, Cryotherapy, and Moist heat.  PLAN  FOR NEXT SESSION: review and progress exercises; continue with back care and spine education; manual work and modalities as indicated   Yaman Grauberger P. Geraldo Klippel PT, MPH 08/14/23 3:33 PM

## 2023-08-16 DIAGNOSIS — E063 Autoimmune thyroiditis: Secondary | ICD-10-CM | POA: Diagnosis not present

## 2023-08-16 DIAGNOSIS — E7800 Pure hypercholesterolemia, unspecified: Secondary | ICD-10-CM | POA: Diagnosis not present

## 2023-08-16 DIAGNOSIS — Z0000 Encounter for general adult medical examination without abnormal findings: Secondary | ICD-10-CM | POA: Diagnosis not present

## 2023-08-17 ENCOUNTER — Encounter: Payer: Self-pay | Admitting: Medical-Surgical

## 2023-08-17 ENCOUNTER — Encounter: Payer: Self-pay | Admitting: Rehabilitative and Restorative Service Providers"

## 2023-08-17 ENCOUNTER — Ambulatory Visit: Attending: Sports Medicine | Admitting: Rehabilitative and Restorative Service Providers"

## 2023-08-17 DIAGNOSIS — R29898 Other symptoms and signs involving the musculoskeletal system: Secondary | ICD-10-CM | POA: Diagnosis present

## 2023-08-17 DIAGNOSIS — M5416 Radiculopathy, lumbar region: Secondary | ICD-10-CM | POA: Diagnosis not present

## 2023-08-17 LAB — CMP14+EGFR
ALT: 15 IU/L (ref 0–32)
AST: 26 IU/L (ref 0–40)
Albumin: 4.5 g/dL (ref 3.8–4.9)
Alkaline Phosphatase: 57 IU/L (ref 44–121)
BUN/Creatinine Ratio: 20 (ref 9–23)
BUN: 18 mg/dL (ref 6–24)
Bilirubin Total: 0.3 mg/dL (ref 0.0–1.2)
CO2: 21 mmol/L (ref 20–29)
Calcium: 9.4 mg/dL (ref 8.7–10.2)
Chloride: 104 mmol/L (ref 96–106)
Creatinine, Ser: 0.91 mg/dL (ref 0.57–1.00)
Globulin, Total: 2.3 g/dL (ref 1.5–4.5)
Glucose: 85 mg/dL (ref 70–99)
Potassium: 4.9 mmol/L (ref 3.5–5.2)
Sodium: 142 mmol/L (ref 134–144)
Total Protein: 6.8 g/dL (ref 6.0–8.5)
eGFR: 74 mL/min/{1.73_m2} (ref >59)

## 2023-08-17 LAB — LIPID PANEL
Chol/HDL Ratio: 3.7 ratio (ref 0.0–4.4)
Cholesterol, Total: 221 mg/dL — ABNORMAL HIGH (ref 100–199)
HDL: 59 mg/dL (ref >39)
LDL Chol Calc (NIH): 139 mg/dL — ABNORMAL HIGH (ref 0–99)
Triglycerides: 131 mg/dL (ref 0–149)
VLDL Cholesterol Cal: 23 mg/dL (ref 5–40)

## 2023-08-17 LAB — CBC WITH DIFFERENTIAL/PLATELET
Basophils Absolute: 0.1 10*3/uL (ref 0.0–0.2)
Basos: 2 %
EOS (ABSOLUTE): 0.1 10*3/uL (ref 0.0–0.4)
Eos: 3 %
Hematocrit: 39.2 % (ref 34.0–46.6)
Hemoglobin: 13.2 g/dL (ref 11.1–15.9)
Immature Grans (Abs): 0 10*3/uL (ref 0.0–0.1)
Immature Granulocytes: 0 %
Lymphocytes Absolute: 1.2 10*3/uL (ref 0.7–3.1)
Lymphs: 32 %
MCH: 34.3 pg — ABNORMAL HIGH (ref 26.6–33.0)
MCHC: 33.7 g/dL (ref 31.5–35.7)
MCV: 102 fL — ABNORMAL HIGH (ref 79–97)
Monocytes Absolute: 0.4 10*3/uL (ref 0.1–0.9)
Monocytes: 11 %
Neutrophils Absolute: 1.9 10*3/uL (ref 1.4–7.0)
Neutrophils: 52 %
Platelets: 247 10*3/uL (ref 150–450)
RBC: 3.85 x10E6/uL (ref 3.77–5.28)
RDW: 11.9 % (ref 11.7–15.4)
WBC: 3.6 10*3/uL (ref 3.4–10.8)

## 2023-08-17 LAB — TSH: TSH: 2.28 u[IU]/mL (ref 0.450–4.500)

## 2023-08-17 NOTE — Therapy (Addendum)
 OUTPATIENT PHYSICAL THERAPY THORACOLUMBAR TREATMENT   Patient Name: Kara Cooper MRN: [id number] DOB:[birth year], 58 y.o., female Today's Date: 08/17/2023  END OF SESSION:  PT End of Session - 08/17/23 1609     Visit Number 5    Number of Visits 16    Date for PT Re-Evaluation 09/26/23    Authorization Type [employer] co pay  - none    Authorization Time Period 4/16-6/14/25    Authorization - Visit Number 5    Authorization - Number of Visits 5    PT Start Time 1609    PT Stop Time 1654    PT Time Calculation (min) 45 min    Activity Tolerance Patient tolerated treatment well              Past Medical History:  Diagnosis Date   Acute right pyelonephritis 04/04/2017   Anemia    in past   Basal cell carcinoma    treated with Moh's procedure   Chicken pox    in past   Headache(784.0)    frequent   History of seasonal allergies    Irregular menses    Migraine    Other and unspecified ovarian cysts    come and go   Postural orthostatic tachycardia syndrome    Dr. Arlester Ladd   UTI (lower urinary tract infection)    history of   Past Surgical History:  Procedure Laterality Date   CERVICAL CONIZATION W/BX  1987, 1988   DILATION AND CURETTAGE OF UTERUS     Patient Active Problem List   Diagnosis Date Noted   Right lumbar radiculitis 07/27/2023   Pulsatile tinnitus of both ears 10/04/2022   Ventricular tachycardia (HCC) 09/01/2022   Generalized anxiety disorder 03/29/2022   Elevated troponin 03/15/2022   UTI (urinary tract infection) 03/15/2022   Anxiety 03/15/2022   Insomnia 03/15/2022   GERD (gastroesophageal reflux disease) 03/15/2022   SVT (supraventricular tachycardia) (HCC) 03/14/2022   Herpes simplex 09/19/2019   Weight gain 09/28/2018   Arthralgia of both hands 01/18/2018   Hypothyroidism due to Hashimoto's thyroiditis 07/05/2017   Chronic fatigue 05/17/2017   Fracture of coronoid process of ulna, left, closed 01/24/2017   Stress reaction 09/27/2013    Routine general medical examination at a health care facility 12/19/2012   Syncope/dysautomia 01/03/2011   Allergic rhinitis 12/29/2008   Migraine with aura 06/19/2008    PCP: Cherre Cornish, NP  REFERRING PROVIDER: Dr Annemarie Kil  REFERRING DIAG: R lumbar radiculitis  Rationale for Evaluation and Treatment: Rehabilitation  THERAPY DIAG:  Right lumbar radiculitis  Other symptoms and signs involving the musculoskeletal system  ONSET DATE: 06/17/23  SUBJECTIVE:  SUBJECTIVE STATEMENT: Patient reports she is having continued pain in the R posterior hip and some pain into the R LE on an intermittent basis since Monday. She is better today than last visit. Having the deep pain in the R hip again that had gotten better, but now is back. She is best in the mornings with pain at 2/10 but symptoms gradually increase through the day. Notes increased pain with walking and LE's feel numb after walking in the morning    EVAL: Patient reports that she started having pain in the R LB and down the R LE ~ 5 weeks ago which may have been due to on line exercises but she is not sure. About 2 weeks ago she was putting mulch in the yard and noticed increased pain after that. Has increased pain with walking up hills or up stairs. Pain has continued in the R LB and LE but she is now also having pain in L LB.  PERTINENT HISTORY:  Denies prior LBP; SBT; hashimoto's disease; fx of L ulnar head 2019  PAIN:  Are you having pain? Yes: NPRS scale: 4/10 Pain location: R > L LB and into R LE posterior/lateral R thigh to posterior calf; foot and lateral three toes  Pain description: deep, pulling/stretching  Aggravating factors: walking up hill; stairs; driving; sitting  Relieving factors: nothing   PRECAUTIONS:  None   WEIGHT BEARING RESTRICTIONS: No  FALLS:  Has patient fallen in last 6 months? 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  DIAGNOSTIC FINDINGS:  Xray 07/27/23 - results not available   PATIENT SURVEYS:  Modified Oswestry 33/50; 66%    SENSATION:  Numbness and tingling deep R buttock; down R LE to toes    MUSCLE LENGTH: Hamstrings: Right 45 deg; Left 50 deg Thomas test: tight R > L   POSTURE: rounded shoulders, forward head, and flexed trunk   PALPATION: Pain with PA mobs Grade II/III lumbar; R SI to lateral sacral border; piriformis; glut med/min; R > L iliopsoas  08/17/23: mild pain with PA mobs Grade III L4/5; L5/S1 - R SI to lateral sacral border; piriformis; glut med/min; R > L iliopsoas   LUMBAR ROM:   AROM eval  Flexion 60% pulling B LE's posterior thigh  Extension 30% discomfort  Right lateral flexion 55%  Left lateral flexion 45% pain R LB/hip  Right rotation 40%  Left rotation 35% pain R LB/hip    (Blank rows = not tested)  LOWER EXTREMITY ROM:     Active  Right eval Left eval  Hip flexion    Hip extension Limited  Tight   Hip abduction    Hip adduction    Hip internal rotation Tight    Hip external rotation Tight    Knee flexion    Knee extension    Ankle dorsiflexion    Ankle plantarflexion    Ankle inversion    Ankle  eversion     (Blank rows = not  tested)  LOWER EXTREMITY MMT:    MMT Right eval 08/17/23 Left eval 08/17/23  Hip flexion 4 4 4+ 4+  Hip extension 4- 4- 4 4  Hip abduction 4 4 5 5   Hip adduction      Hip internal rotation      Hip external rotation      Knee flexion      Knee extension      Ankle dorsiflexion      Ankle plantarflexion      Ankle inversion      Ankle eversion       (Blank rows = not tested)  LUMBAR SPECIAL TESTS:  Straight leg raise test: Positive R with increased R LE radicular symptoms; Slump test: Negative  08/17/23: Straight leg raise test: Positive R with increased R LE radicular symptoms  FUNCTIONAL TESTS:  5 times sit to stand: 23.61 sec use of UE's for stand to sit and last 2 reps min use of UE's sit to stand  SLS - 10 sec more difficult on R    08/17/23: 5 times sit to stand; 26.9 sec GAIT: Distance walked: 40 ft Assistive device utilized: None Level of assistance: Complete Independence Comments: WFL's   [facility] Adult PT Treatment:                                                DATE: 08/17/23 Therapeutic Exercise: Prone Prone prop 2-3 min  Prone press up partial range 2-3 sec hold x 10  Standing  Extension x 10  Supine Diaphragmatic breathing  4 part core  Sciatic nerve glide 2-3 sec x 10 R Manual Therapy: PA mobs through lumbar spine Grande II/III STM R posterior hip through piriformis and gluts; lumbar paraspinals/lats PROM IR/ER R LE hip extended knee flexed x 10 - note tightness with IR > ER  Therapeutic Activity: Prone Prone prop 2-3 min  Prone press up partial range 2-3 sec hold x 10  Prone press up with overpressure by PT x 5  Supine Piriformis stretch 30 sec x 3  Dead bug alternating arms and legs x 10 x 2  Trial of table top LE's tolerated ~ 15 sec; added trial of heel tap created back pain Shoulder flexion hooklying yellow TB  core engaged 10 x 2  Shoulder horizontal abduction hooklying yellow TB core engaged 10 x 2  Bridge 5 sec x 10  Hamstring stretch supine  hooklying with strap 30 sec x 3 R  Standing   Row green TB core engaged 3 sec x 10  Shoulder extension green TB core engaged 3 sec x 10  Self Care: Myofacial ball release work standing prone at hip flexors ~ 1 min some LE radicular numbness resolved quickly Myofacial ball release working through the R posterior hip; sitting with ball hamstring origin    [facility] Adult PT Treatment:                                                DATE: 08/14/23 Therapeutic Exercise: Prone Prone prop 2-3 min  Prone press up partial range 2-3 sec hold x 10  Standing  Extension x 10  Supine Diaphragmatic breathing  4 part core  Sciatic nerve glide 2-3 sec x 10 R Manual Therapy: PA mobs through lumbar spine Grande II/III STM R posterior hip through piriformis and gluts; lumbar paraspinals/lats PROM IR/ER R LE hip extended knee flexed x 10  Therapeutic Activity: Prone Prone prop 2-3 min  Prone press up partial range 2-3 sec hold x 10  Prone press up with overpressure by PT x 5  Supine Piriformis stretch 30 sec x 3  Bridge 5 sec x 10 (some pain w/ first rep - resolved with repetitions) Hamstring stretch supine hooklying with strap 30 sec x 3 R  Sitting  Coregeous ball T spine thoracic extension hands behind head 10 sec x 3  Self Care: Myofacial ball release work standing working through the R posterior hip; sitting with ball hamstring origin  Education re- contributory activities and positions that irritate LB and R LE symptoms - avoid sitting with legs crossed; avoid forward flexed postures and positions; avoid sitting in soft sofa/chairs   [facility] Adult PT Treatment:                                                DATE: 08/11/23 Therapeutic Exercise: Prone pressup x 10  Standing lumbar extension 2 x 10  Sciatic nerve glide x 10  Updated HEP  Manual Therapy: STM Rt gluteals/piriformis  Prone pressup with lumbar overpressure x 10  LAD x 1 min each   Self Care: Educated on TPDN indications, side  effects.  Posture and positioning to avoid (excessive trunk flexion)    TREATMENT DATE: 08/01/23  Review of evaluation findings; pathology; POC Exercises per POC Education re extension principle  Self care re-positions for sitting, lying down, sleeping                                                                                                                                 PATIENT EDUCATION:  Education details: TPDN, HEP Person educated: Patient Education method: Explanation and Handouts, demo, cues Education comprehension: verbalized understanding, returned demo, cues   HOME EXERCISE PROGRAM: Access Code: [email or web address] URL: [email or web address] Date: 08/17/2023 Prepared by: Paulyne Mooty  Exercises - Cat Cow  - 2 x daily - 7 x weekly - 1 sets - 5-10 reps - 3-5 sec  hold - Prone Press Up On Elbows  - 2 x daily - 7 x weekly - 1 sets - 2 reps - 1-5 min  hold - Supine Sciatic Nerve Glide  - 2 x daily - 7 x weekly - 1 sets - 8-10 reps - 1-2 sec  hold - Supine Transversus Abdominis Bracing with Pelvic Floor Contraction  - 2 x daily - 7 x weekly - 1 sets - 10 reps - 10sec  hold - Supine Diaphragmatic Breathing  - 2 x daily - 7 x  weekly - 1 sets - 10 reps - 4-6 sec  hold - Prone Press Up  - 2 x daily - 7 x weekly - 1 sets - 10 reps - 2-3 sec  hold - Cat Cow to Child's Pose  - 2 x daily - 7 x weekly - 1 sets - 3 reps - 30 sec  hold - Supine Piriformis Stretch with Leg Straight  - 2 x daily - 7 x weekly - 1 sets - 3 reps - 30 sec  hold - Seated Thoracic Lumbar Extension  - 2 x daily - 7 x weekly - 1 sets - 3-5 reps - 5-10 sec  hold - Hooklying Hamstring Stretch with Strap  - 2 x daily - 7 x weekly - 1 sets - 3 reps - 30 sec  hold - Supine Bridge  - 2 x daily - 7 x weekly - 1-2 sets - 10 reps - 5-10 sec  hold - Standing Lumbar Extension with Counter  - 6 x daily - 7 x weekly - 1 sets - 10 reps - Dead Bug  - 2 x daily - 7 x weekly - 1-2 sets - 10 reps - 2 sec  hold - Supine  Shoulder Flexion Extension AAROM with Dowel  - 1 x daily - 7 x weekly - 1-2 sets - 10 reps - 3 sec  hold - Supine Shoulder Horizontal Abduction with Resistance  - 1 x daily - 7 x weekly - 1-3 sets - 10 reps - 3-5 sec  hold - Standing Bilateral Low Shoulder Row with Anchored Resistance  - 1 x daily - 7 x weekly - 1-3 sets - 10 reps - 2-3 sec  hold - Shoulder extension with resistance - Neutral  - 1 x daily - 7 x weekly - 1-2 sets - 10 reps - 3-5 sec  hold  ASSESSMENT:  CLINICAL IMPRESSION: Patient returns reporting improvement from last visit. She has decreased pain in R LB and decreased R LE pain compared to initial HEP. Patient continued with extension program as well as stretching and strengthening program. Progressed core stabilization in supine and standing which was tolerated well. Manual work through lumbar spine and R posterior hip. Good response to treatment. Will try TPDN for R posterior hip at next visit. Patient would like to try the dry needling. Patient will hold on walking program due to increased radicular pain with walking. Patient will benefit from continued treatment to achieve maximum rehab potential.    EVAL: Patient is a 58 y.o. female who was seen today for physical therapy evaluation and treatment for R lumbar radiculitis. She reports no specific injury but may have irritated LB with on line exercise program ~ 5 weeks ago. She further irritated symptoms ~ 2 weeks ago putting mulch in her yard. Patient presents with limited trunk and LE mobility/ROM; decreased strength R LE possibly due to pain and abnormal movement; muscular tightness to palpation; radicular pain R LE; poor movement patterns; decrease endurance for ADL's and functional activities. Patient will benefit from physical therapy to address problems identified. Patient responded well to initial intervention reporting decreased radicular pain and improved mobility.    GOALS: Goals reviewed with patient? Yes  SHORT TERM  GOALS: Target date: 08/29/2023   Independent in initial HEP  Baseline: Goal status: met  2.  Decrease R LE radicular symptoms by 50%  Baseline:  Goal status: on going   3.  Patient will demonstrate and verbalize proper techniques for back care  principles  Baseline:  Goal status: on going    LONG TERM GOALS: Target date: 09/26/2023   Decrease pain by 75-100% allowing patient to return to normal functional activities  Baseline:  Goal status: met   2.  LE strength 4+/5 to 5/5  Baseline:  Goal status: on going   3.  Lumbar ROM WFL and pain free  Baseline:  Goal status: on going   4.  Patient tolerates sitting, standing, walking for >/= 1 hour with no increase in LBP or radicular pain  Baseline:  Goal status: on going   5.  Independent in HEP including aquatic program as indicated  Baseline:  Goal status: on going   6.  Improve Owestry by 10-20 points  Baseline: 33/50; 66% Goal status: on going   PLAN:  PT FREQUENCY: 2x/week  PT DURATION: 8 weeks  PLANNED INTERVENTIONS: 97110-Therapeutic exercises, 97530- Therapeutic activity, 97112- Neuromuscular re-education, 97535- Self Care, 16109- Manual therapy, [phone]- Aquatic Therapy, Patient/Family education, Balance training, Stair training, Taping, Dry Needling, Joint mobilization, Cryotherapy, and Moist heat.  PLAN FOR NEXT SESSION: review and progress exercises; continue with back care and spine education; manual work and modalities as indicated   Kizzy Olafson P. Geraldo Klippel PT, MPH 08/17/23 4:58 PM

## 2023-08-21 ENCOUNTER — Encounter: Admitting: Rehabilitative and Restorative Service Providers"

## 2023-08-22 ENCOUNTER — Ambulatory Visit

## 2023-08-22 DIAGNOSIS — M5416 Radiculopathy, lumbar region: Secondary | ICD-10-CM

## 2023-08-22 DIAGNOSIS — R29898 Other symptoms and signs involving the musculoskeletal system: Secondary | ICD-10-CM

## 2023-08-22 NOTE — Patient Instructions (Signed)

## 2023-08-22 NOTE — Therapy (Signed)
 OUTPATIENT PHYSICAL THERAPY THORACOLUMBAR TREATMENT   Patient Name: Kara Cooper MRN: [id number] DOB:[birth year], 58 y.o., female Today's Date: 08/22/2023  END OF SESSION:  PT End of Session - 08/22/23 1532     Visit Number 6    Number of Visits 16    Date for PT Re-Evaluation 09/26/23    Authorization Type [employer] co pay  - none    Authorization Time Period 08/21/23-09/19/23    Authorization - Visit Number 1    Authorization - Number of Visits 4    PT Start Time 1531    PT Stop Time 1615    PT Time Calculation (min) 44 min    Activity Tolerance Patient tolerated treatment well               Past Medical History:  Diagnosis Date   Acute right pyelonephritis 04/04/2017   Anemia    in past   Basal cell carcinoma    treated with Moh's procedure   Chicken pox    in past   Headache(784.0)    frequent   History of seasonal allergies    Irregular menses    Migraine    Other and unspecified ovarian cysts    come and go   Postural orthostatic tachycardia syndrome    Dr. Arlester Ladd   UTI (lower urinary tract infection)    history of   Past Surgical History:  Procedure Laterality Date   CERVICAL CONIZATION W/BX  1987, 1988   DILATION AND CURETTAGE OF UTERUS     Patient Active Problem List   Diagnosis Date Noted   Right lumbar radiculitis 07/27/2023   Pulsatile tinnitus of both ears 10/04/2022   Ventricular tachycardia (HCC) 09/01/2022   Generalized anxiety disorder 03/29/2022   Elevated troponin 03/15/2022   UTI (urinary tract infection) 03/15/2022   Anxiety 03/15/2022   Insomnia 03/15/2022   GERD (gastroesophageal reflux disease) 03/15/2022   SVT (supraventricular tachycardia) (HCC) 03/14/2022   Herpes simplex 09/19/2019   Weight gain 09/28/2018   Arthralgia of both hands 01/18/2018   Hypothyroidism due to Hashimoto's thyroiditis 07/05/2017   Chronic fatigue 05/17/2017   Fracture of coronoid process of ulna, left, closed 01/24/2017   Stress reaction 09/27/2013    Routine general medical examination at a health care facility 12/19/2012   Syncope/dysautomia 01/03/2011   Allergic rhinitis 12/29/2008   Migraine with aura 06/19/2008    PCP: Cherre Cornish, NP  REFERRING PROVIDER: Dr Annemarie Kil  REFERRING DIAG: R lumbar radiculitis  Rationale for Evaluation and Treatment: Rehabilitation  THERAPY DIAG:  Right lumbar radiculitis  Other symptoms and signs involving the musculoskeletal system  ONSET DATE: 06/17/23  SUBJECTIVE:  SUBJECTIVE STATEMENT: Patient reports she is feeling a lot better. Can walk without it hurting for a little while. Was completing HEP frequently over the weekend and feels like this helped. Still has the deep ache in the hip.    EVAL: Patient reports that she started having pain in the R LB and down the R LE ~ 5 weeks ago which may have been due to on line exercises but she is not sure. About 2 weeks ago she was putting mulch in the yard and noticed increased pain after that. Has increased pain with walking up hills or up stairs. Pain has continued in the R LB and LE but she is now also having pain in L LB.  PERTINENT HISTORY:  Denies prior LBP; SBT; hashimoto's disease; fx of L ulnar head 2019  PAIN:  Are you having pain? Yes: NPRS scale: 2/10 Pain location: Rt lateral hip  Pain description: deep, pulling/stretching  Aggravating factors: walking up hill; stairs; driving; sitting  Relieving factors: nothing   PRECAUTIONS: None   WEIGHT BEARING RESTRICTIONS: No  FALLS:  Has patient fallen in last 6 months? 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  DIAGNOSTIC FINDINGS:  Xray 07/27/23 - results not available   PATIENT SURVEYS:  Modified Oswestry 33/50; 66%    SENSATION:  Numbness and tingling deep R buttock; down R LE to toes    MUSCLE LENGTH: Hamstrings: Right 45 deg; Left 50 deg Thomas test: tight R > L   POSTURE: rounded shoulders, forward head, and flexed trunk   PALPATION: Pain with PA mobs Grade II/III lumbar; R SI to lateral sacral border; piriformis; glut med/min; R > L iliopsoas  08/17/23: mild pain with PA mobs Grade III L4/5; L5/S1 - R SI to lateral sacral border; piriformis; glut med/min; R > L iliopsoas   LUMBAR ROM:   AROM eval  Flexion 60% pulling B LE's posterior thigh  Extension 30% discomfort  Right lateral flexion 55%  Left lateral flexion 45% pain R LB/hip  Right rotation 40%  Left rotation 35% pain R LB/hip    (Blank rows = not tested)  LOWER EXTREMITY ROM:     Active  Right eval Left eval  Hip flexion    Hip extension Limited  Tight   Hip abduction    Hip adduction    Hip internal rotation Tight    Hip external rotation Tight    Knee flexion    Knee extension    Ankle dorsiflexion    Ankle plantarflexion    Ankle inversion    Ankle eversion     (Blank rows = not tested)  LOWER EXTREMITY MMT:    MMT Right eval 08/17/23 Left eval 08/17/23  Hip flexion 4 4 4+ 4+  Hip extension 4- 4- 4 4  Hip abduction 4 4 5 5   Hip adduction      Hip internal rotation  Hip external rotation      Knee flexion      Knee extension      Ankle dorsiflexion      Ankle plantarflexion      Ankle  inversion      Ankle eversion       (Blank rows = not tested)  LUMBAR SPECIAL TESTS:  Straight leg raise test: Positive R with increased R LE radicular symptoms; Slump test: Negative  08/17/23: Straight leg raise test: Positive R with increased R LE radicular symptoms  FUNCTIONAL TESTS:  5 times sit to stand: 23.61 sec use of UE's for stand to sit and last 2 reps min use of UE's sit to stand  SLS - 10 sec more difficult on R    08/17/23: 5 times sit to stand; 26.9 sec GAIT: Distance walked: 40 ft Assistive device utilized: None Level of assistance: Complete Independence Comments: WFL's  [facility] Adult PT Treatment:                                                DATE: 08/22/23  Manual Therapy: Skilled palpation of trigger points  STM Rt gluteals, lumbar paraspinals Trigger Point Dry Needling  Initial Treatment: Pt instructed on Dry Needling rational, procedures, and possible side effects. Pt instructed to expect mild to moderate muscle soreness later in the day and/or into the next day.  Pt instructed in methods to reduce muscle soreness. Pt instructed to continue prescribed HEP. Patient was educated on signs and symptoms of infection and other risk factors and advised to seek medical attention should they occur.  Patient verbalized understanding of these instructions and education.   Patient Verbal Consent Given: Yes Education Handout Provided: Yes Muscles Treated: Rt gluteals  Electrical Stimulation Performed: No Treatment Response/Outcome: deep twitch response reported    Neuromuscular re-ed: Prone hip extension 2 x 10  Supine TA march 2 x 10  90/90 march 2 x 10  Hip bridge with abduction blue band 2 x 10  Therapeutic Activity: Walking 1 lap in clinic post TPDN   [facility] Adult PT Treatment:                                                DATE: 08/17/23 Therapeutic Exercise: Prone Prone prop 2-3 min  Prone press up partial range 2-3 sec hold x 10  Standing  Extension x 10   Supine Diaphragmatic breathing  4 part core  Sciatic nerve glide 2-3 sec x 10 R Manual Therapy: PA mobs through lumbar spine Grande II/III STM R posterior hip through piriformis and gluts; lumbar paraspinals/lats PROM IR/ER R LE hip extended knee flexed x 10 - note tightness with IR > ER  Therapeutic Activity: Prone Prone prop 2-3 min  Prone press up partial range 2-3 sec hold x 10  Prone press up with overpressure by PT x 5  Supine Piriformis stretch 30 sec x 3  Dead bug alternating arms and legs x 10 x 2  Trial of table top LE's tolerated ~ 15 sec; added trial of heel tap created back pain Shoulder flexion hooklying yellow TB  core engaged 10 x 2  Shoulder horizontal abduction hooklying yellow TB core engaged 10 x 2  Bridge  5 sec x 10  Hamstring stretch supine hooklying with strap 30 sec x 3 R  Standing   Row green TB core engaged 3 sec x 10  Shoulder extension green TB core engaged 3 sec x 10  Self Care: Myofacial ball release work standing prone at hip flexors ~ 1 min some LE radicular numbness resolved quickly Myofacial ball release working through the R posterior hip; sitting with ball hamstring origin    [facility] Adult PT Treatment:                                                DATE: 08/14/23 Therapeutic Exercise: Prone Prone prop 2-3 min  Prone press up partial range 2-3 sec hold x 10  Standing  Extension x 10  Supine Diaphragmatic breathing  4 part core  Sciatic nerve glide 2-3 sec x 10 R Manual Therapy: PA mobs through lumbar spine Grande II/III STM R posterior hip through piriformis and gluts; lumbar paraspinals/lats PROM IR/ER R LE hip extended knee flexed x 10  Therapeutic Activity: Prone Prone prop 2-3 min  Prone press up partial range 2-3 sec hold x 10  Prone press up with overpressure by PT x 5  Supine Piriformis stretch 30 sec x 3  Bridge 5 sec x 10 (some pain w/ first rep - resolved with repetitions) Hamstring stretch supine hooklying with strap 30  sec x 3 R  Sitting  Coregeous ball T spine thoracic extension hands behind head 10 sec x 3  Self Care: Myofacial ball release work standing working through the R posterior hip; sitting with ball hamstring origin  Education re- contributory activities and positions that irritate LB and R LE symptoms - avoid sitting with legs crossed; avoid forward flexed postures and positions; avoid sitting in soft sofa/chairs    PATIENT EDUCATION:  Education details: TPDN, Person educated: Patient Education method: Chief Technology Officer,  Education comprehension: verbalized understanding,   HOME EXERCISE PROGRAM: Access Code: [email or web address] URL: [email or web address] Date: 08/17/2023 Prepared by: Celyn Holt  Exercises - Cat Cow  - 2 x daily - 7 x weekly - 1 sets - 5-10 reps - 3-5 sec  hold - Prone Press Up On Elbows  - 2 x daily - 7 x weekly - 1 sets - 2 reps - 1-5 min  hold - Supine Sciatic Nerve Glide  - 2 x daily - 7 x weekly - 1 sets - 8-10 reps - 1-2 sec  hold - Supine Transversus Abdominis Bracing with Pelvic Floor Contraction  - 2 x daily - 7 x weekly - 1 sets - 10 reps - 10sec  hold - Supine Diaphragmatic Breathing  - 2 x daily - 7 x weekly - 1 sets - 10 reps - 4-6 sec  hold - Prone Press Up  - 2 x daily - 7 x weekly - 1 sets - 10 reps - 2-3 sec  hold - Cat Cow to Child's Pose  - 2 x daily - 7 x weekly - 1 sets - 3 reps - 30 sec  hold - Supine Piriformis Stretch with Leg Straight  - 2 x daily - 7 x weekly - 1 sets - 3 reps - 30 sec  hold - Seated Thoracic Lumbar Extension  - 2 x daily - 7 x weekly - 1 sets - 3-5 reps - 5-10 sec  hold -  Hooklying Hamstring Stretch with Strap  - 2 x daily - 7 x weekly - 1 sets - 3 reps - 30 sec  hold - Supine Bridge  - 2 x daily - 7 x weekly - 1-2 sets - 10 reps - 5-10 sec  hold - Standing Lumbar Extension with Counter  - 6 x daily - 7 x weekly - 1 sets - 10 reps - Dead Bug  - 2 x daily - 7 x weekly - 1-2 sets - 10 reps - 2 sec  hold - Supine  Shoulder Flexion Extension AAROM with Dowel  - 1 x daily - 7 x weekly - 1-2 sets - 10 reps - 3 sec  hold - Supine Shoulder Horizontal Abduction with Resistance  - 1 x daily - 7 x weekly - 1-3 sets - 10 reps - 3-5 sec  hold - Standing Bilateral Low Shoulder Row with Anchored Resistance  - 1 x daily - 7 x weekly - 1-3 sets - 10 reps - 2-3 sec  hold - Shoulder extension with resistance - Neutral  - 1 x daily - 7 x weekly - 1-2 sets - 10 reps - 3-5 sec  hold  ASSESSMENT:  CLINICAL IMPRESSION: Patient reports an overall improvement in pain. TPDN performed to Rt gluteals with patient reporting deep twitch response and reported abolishment of Rt deep hip pain noted post-intervention when walking in clinic. Focused on progression of core stabilization and hip strengthening with good tolerance, though challenged with 90/90 dynamic core strengthening.   EVAL: Patient is a 58 y.o. female who was seen today for physical therapy evaluation and treatment for R lumbar radiculitis. She reports no specific injury but may have irritated LB with on line exercise program ~ 5 weeks ago. She further irritated symptoms ~ 2 weeks ago putting mulch in her yard. Patient presents with limited trunk and LE mobility/ROM; decreased strength R LE possibly due to pain and abnormal movement; muscular tightness to palpation; radicular pain R LE; poor movement patterns; decrease endurance for ADL's and functional activities. Patient will benefit from physical therapy to address problems identified. Patient responded well to initial intervention reporting decreased radicular pain and improved mobility.    GOALS: Goals reviewed with patient? Yes  SHORT TERM GOALS: Target date: 08/29/2023   Independent in initial HEP  Baseline: Goal status: met  2.  Decrease R LE radicular symptoms by 50%  Baseline:  Goal status: on going   3.  Patient will demonstrate and verbalize proper techniques for back care principles  Baseline:  Goal  status: on going    LONG TERM GOALS: Target date: 09/26/2023   Decrease pain by 75-100% allowing patient to return to normal functional activities  Baseline:  Goal status: met   2.  LE strength 4+/5 to 5/5  Baseline:  Goal status: on going   3.  Lumbar ROM WFL and pain free  Baseline:  Goal status: on going   4.  Patient tolerates sitting, standing, walking for >/= 1 hour with no increase in LBP or radicular pain  Baseline:  Goal status: on going   5.  Independent in HEP including aquatic program as indicated  Baseline:  Goal status: on going   6.  Improve Owestry by 10-20 points  Baseline: 33/50; 66% Goal status: on going   PLAN:  PT FREQUENCY: 2x/week  PT DURATION: 8 weeks  PLANNED INTERVENTIONS: 97110-Therapeutic exercises, 97530- Therapeutic activity, [id number]- Neuromuscular re-education, 97535- Self Care, [id number]- Manual therapy, [id number]- Aquatic Therapy,  Patient/Family education, Balance training, Stair training, Taping, Dry Needling, Joint mobilization, Cryotherapy, and Moist heat.  PLAN FOR NEXT SESSION: review and progress exercises; continue with back care and spine education; manual work and modalities as indicated; inclined treadmill   Forrestine Ike, PT, DPT, ATC 08/22/23 4:17 PM

## 2023-08-23 ENCOUNTER — Ambulatory Visit (INDEPENDENT_AMBULATORY_CARE_PROVIDER_SITE_OTHER): Payer: BC Managed Care – PPO | Admitting: Medical-Surgical

## 2023-08-23 ENCOUNTER — Encounter: Payer: Self-pay | Admitting: Medical-Surgical

## 2023-08-23 VITALS — BP 95/60 | HR 63 | Resp 20 | Ht 63.0 in | Wt 153.7 lb

## 2023-08-23 DIAGNOSIS — F411 Generalized anxiety disorder: Secondary | ICD-10-CM

## 2023-08-23 DIAGNOSIS — D7589 Other specified diseases of blood and blood-forming organs: Secondary | ICD-10-CM

## 2023-08-23 DIAGNOSIS — Z0000 Encounter for general adult medical examination without abnormal findings: Secondary | ICD-10-CM

## 2023-08-23 DIAGNOSIS — E063 Autoimmune thyroiditis: Secondary | ICD-10-CM | POA: Diagnosis not present

## 2023-08-23 DIAGNOSIS — E7800 Pure hypercholesterolemia, unspecified: Secondary | ICD-10-CM

## 2023-08-23 DIAGNOSIS — R768 Other specified abnormal immunological findings in serum: Secondary | ICD-10-CM

## 2023-08-23 MED ORDER — SUMATRIPTAN SUCCINATE 50 MG PO TABS: 50.0000 mg | ORAL_TABLET | Freq: Every day | ORAL | 2 refills | Status: AC | PRN

## 2023-08-23 MED ORDER — LEVOTHYROXINE SODIUM 88 MCG PO TABS: 88.0000 ug | ORAL_TABLET | Freq: Every day | ORAL | 3 refills | Status: AC

## 2023-08-23 MED ORDER — VALACYCLOVIR HCL 500 MG PO TABS: 500.0000 mg | ORAL_TABLET | Freq: Every day | ORAL | 3 refills | Status: AC

## 2023-08-23 MED ORDER — OMEPRAZOLE 20 MG PO CPDR: 20.0000 mg | DELAYED_RELEASE_CAPSULE | Freq: Every day | ORAL | 3 refills | Status: AC

## 2023-08-23 NOTE — Patient Instructions (Signed)
 Preventive Care 16-55 Years Old, Female  Preventive care refers to lifestyle choices and visits with your health care provider that can promote health and wellness. Preventive care visits are also called wellness exams.  What can I expect for my preventive care visit?  Counseling  Your health care provider may ask you questions about your:  Medical history, including:  Past medical problems.  Family medical history.  Pregnancy history.  Current health, including:  Menstrual cycle.  Method of birth control.  Emotional well-being.  Home life and relationship well-being.  Sexual activity and sexual health.  Lifestyle, including:  Alcohol, nicotine or tobacco, and drug use.  Access to firearms.  Diet, exercise, and sleep habits.  Work and work Astronomer.  Sunscreen use.  Safety issues such as seatbelt and bike helmet use.  Physical exam  Your health care provider will check your:  Height and weight. These may be used to calculate your BMI (body mass index). BMI is a measurement that tells if you are at a healthy weight.  Waist circumference. This measures the distance around your waistline. This measurement also tells if you are at a healthy weight and may help predict your risk of certain diseases, such as type 2 diabetes and high blood pressure.  Heart rate and blood pressure.  Body temperature.  Skin for abnormal spots.  What immunizations do I need?    Vaccines are usually given at various ages, according to a schedule. Your health care provider will recommend vaccines for you based on your age, medical history, and lifestyle or other factors, such as travel or where you work.  What tests do I need?  Screening  Your health care provider may recommend screening tests for certain conditions. This may include:  Lipid and cholesterol levels.  Diabetes screening. This is done by checking your blood sugar (glucose) after you have not eaten for a while (fasting).  Pelvic exam and Pap test.  Hepatitis B test.  Hepatitis C  test.  HIV (human immunodeficiency virus) test.  STI (sexually transmitted infection) testing, if you are at risk.  Lung cancer screening.  Colorectal cancer screening.  Mammogram. Talk with your health care provider about when you should start having regular mammograms. This may depend on whether you have a family history of breast cancer.  BRCA-related cancer screening. This may be done if you have a family history of breast, ovarian, tubal, or peritoneal cancers.  Bone density scan. This is done to screen for osteoporosis.  Talk with your health care provider about your test results, treatment options, and if necessary, the need for more tests.  Follow these instructions at home:  Eating and drinking    Eat a diet that includes fresh fruits and vegetables, whole grains, lean protein, and low-fat dairy products.  Take vitamin and mineral supplements as recommended by your health care provider.  Do not drink alcohol if:  Your health care provider tells you not to drink.  You are pregnant, may be pregnant, or are planning to become pregnant.  If you drink alcohol:  Limit how much you have to 0-1 drink a day.  Know how much alcohol is in your drink. In the [place] one drink equals one 12 oz bottle of beer (355 mL), one 5 oz glass of wine (148 mL), or one 1 oz glass of hard liquor (44 mL).  Lifestyle  Brush your teeth every morning and night with fluoride toothpaste. Floss one time each day.  Exercise for at least  30 minutes 5 or more days each week.  Do not use any products that contain nicotine or tobacco. These products include cigarettes, chewing tobacco, and vaping devices, such as e-cigarettes. If you need help quitting, ask your health care provider.  Do not use drugs.  If you are sexually active, practice safe sex. Use a condom or other form of protection to prevent STIs.  If you do not wish to become pregnant, use a form of birth control. If you plan to become pregnant, see your health care provider for a  prepregnancy visit.  Take aspirin only as told by your health care provider. Make sure that you understand how much to take and what form to take. Work with your health care provider to find out whether it is safe and beneficial for you to take aspirin daily.  Find healthy ways to manage stress, such as:  Meditation, yoga, or listening to music.  Journaling.  Talking to a trusted person.  Spending time with friends and family.  Minimize exposure to UV radiation to reduce your risk of skin cancer.  Safety  Always wear your seat belt while driving or riding in a vehicle.  Do not drive:  If you have been drinking alcohol. Do not ride with someone who has been drinking.  When you are tired or distracted.  While texting.  If you have been using any mind-altering substances or drugs.  Wear a helmet and other protective equipment during sports activities.  If you have firearms in your house, make sure you follow all gun safety procedures.  Seek help if you have been physically or sexually abused.  What's next?  Visit your health care provider once a year for an annual wellness visit.  Ask your health care provider how often you should have your eyes and teeth checked.  Stay up to date on all vaccines.  This information is not intended to replace advice given to you by your health care provider. Make sure you discuss any questions you have with your health care provider.  Document Revised: 09/30/2020 Document Reviewed: 09/30/2020  Elsevier Patient Education  2024 [employer].

## 2023-08-23 NOTE — Progress Notes (Unsigned)
 Complete physical exam  Patient: Kara Cooper   DOB: 1965-06-06   58 y.o. Female  MRN: [id number]  Subjective:    Chief Complaint  Patient presents with   Annual Exam   Shakilah Linnebur is a 58 y.o. female who presents today for a complete physical exam. She reports consuming a pescatarian diet. Walking and functional training for exercise. She generally feels well. She reports sleeping well. She does not have additional problems to discuss today.   Most recent fall risk assessment:    09/01/2022    3:33 PM  Fall Risk   Falls in the past year? 0  Number falls in past yr: 0  Injury with Fall? 0  Risk for fall due to : No Fall Risks  Follow up Falls evaluation completed     Most recent depression screenings:    05/23/2023   10:57 AM 09/01/2022    3:33 PM  PHQ 2/9 Scores  PHQ - 2 Score 0 1    Vision:Within last year and Dental: No current dental problems and Receives regular dental care    Patient Care Team: Cherre Cornish, NP as PCP - General (Nurse Practitioner) Nahser, Lela Purple, MD as PCP - Cardiology (Cardiology)   Outpatient Medications Prior to Visit  Medication Sig   acetaminophen  (TYLENOL ) 500 MG tablet Take OTC as directed   albuterol  (VENTOLIN  HFA) 108 (90 Base) MCG/ACT inhaler 2 PUFFS EVERY 6 HOURS AS NEEDED SOB   APPLE CIDER VINEGAR-GINGER PO Take 2 capsules by mouth in the morning and at bedtime.   buPROPion  (WELLBUTRIN  XL) 300 MG 24 hr tablet Take 300 mg by mouth daily.   cetirizine (ZYRTEC) 10 MG tablet Take 10 mg by mouth daily.   fluticasone  (FLONASE ) 50 MCG/ACT nasal spray USE 2 SPRAYS IN EACH NOSTRIL DAILY AS NEEDED   hydrOXYzine  (ATARAX ) 50 MG tablet Take 0.5-1 tablets (25-50 mg total) by mouth at bedtime and may repeat dose one time if needed.   Ibuprofen 200 MG CAPS    levothyroxine  (SYNTHROID ) 88 MCG tablet Take 1 tablet (88 mcg total) by mouth daily before breakfast.   metoprolol  succinate (TOPROL -XL) 25 MG 24 hr tablet Take 1 tablet (25 mg total) by  mouth daily.   omeprazole (PRILOSEC) 20 MG capsule Take 20 mg by mouth daily.   ondansetron  (ZOFRAN ) 4 MG tablet Take 1-2 tablets (4-8 mg total) by mouth every 8 (eight) hours as needed for nausea or vomiting.   propranolol  (INDERAL ) 10 MG tablet Take 1 tablet (10 mg total) by mouth 4 (four) times daily as needed (SVT).   SUMAtriptan  (IMITREX ) 50 MG tablet Take 1 tablet (50 mg total) by mouth daily as needed for migraine. Take one by mouth times one as needed for migraine- max one pill in a day.   valACYclovir  (VALTREX ) 500 MG tablet Take 1 tablet (500 mg total) by mouth daily.   [DISCONTINUED] Multiple Vitamin (MULTIVITAMIN) tablet Take 1 tablet by mouth daily.     [DISCONTINUED] predniSONE  (DELTASONE ) 50 MG tablet One tab PO daily for 5 days. (Patient not taking: Reported on 08/23/2023)   No facility-administered medications prior to visit.   Review of Systems  Constitutional:  Negative for chills, fever, malaise/fatigue and weight loss.  HENT:  Negative for congestion, ear pain, hearing loss, sinus pain and sore throat.   Eyes:  Negative for blurred vision, photophobia and pain.  Respiratory:  Negative for cough, shortness of breath and wheezing.   Cardiovascular:  Positive for palpitations (Known SVT,  treated). Negative for chest pain and leg swelling.  Gastrointestinal:  Negative for abdominal pain, constipation, diarrhea, heartburn, nausea and vomiting.  Genitourinary:  Negative for dysuria, frequency and urgency.  Musculoskeletal:  Negative for falls and neck pain.  Skin:  Negative for itching and rash.  Neurological:  Negative for dizziness, weakness and headaches.  Endo/Heme/Allergies:  Negative for polydipsia. Does not bruise/bleed easily.  Psychiatric/Behavioral:  Negative for depression, substance abuse and suicidal ideas. The patient is not nervous/anxious and does not have insomnia.      Objective:    LMP 05/03/2017 (Approximate)    Physical Exam Constitutional:       General: She is not in acute distress.    Appearance: Normal appearance. She is not ill-appearing.  HENT:     Head: Normocephalic and atraumatic.     Right Ear: Tympanic membrane, ear canal and external ear normal. There is no impacted cerumen.     Left Ear: Tympanic membrane, ear canal and external ear normal. There is no impacted cerumen.     Nose: Nose normal. No congestion or rhinorrhea.     Mouth/Throat:     Mouth: Mucous membranes are moist.     Pharynx: No oropharyngeal exudate or posterior oropharyngeal erythema.  Eyes:     General: No scleral icterus.       Right eye: No discharge.        Left eye: No discharge.     Extraocular Movements: Extraocular movements intact.     Conjunctiva/sclera: Conjunctivae normal.     Pupils: Pupils are equal, round, and reactive to light.  Neck:     Thyroid : No thyromegaly.     Vascular: No carotid bruit or JVD.     Trachea: Trachea normal.  Cardiovascular:     Rate and Rhythm: Normal rate and regular rhythm.     Pulses: Normal pulses.     Heart sounds: Normal heart sounds. No murmur heard.    No friction rub. No gallop.  Pulmonary:     Effort: Pulmonary effort is normal. No respiratory distress.     Breath sounds: Normal breath sounds. No wheezing.  Abdominal:     General: Bowel sounds are normal. There is no distension.     Palpations: Abdomen is soft.     Tenderness: There is no abdominal tenderness. There is no guarding.  Musculoskeletal:        General: Normal range of motion.     Cervical back: Normal range of motion and neck supple.  Lymphadenopathy:     Cervical: No cervical adenopathy.  Skin:    General: Skin is warm and dry.  Neurological:     Mental Status: She is alert and oriented to person, place, and time.     Cranial Nerves: No cranial nerve deficit.  Psychiatric:        Mood and Affect: Mood normal.        Behavior: Behavior normal.        Thought Content: Thought content normal.        Judgment: Judgment  normal.     No results found for any visits on 08/23/23.     Assessment & Plan:    Routine Health Maintenance and Physical Exam  Immunization History  Administered Date(s) Administered   Influenza, Mdck, Trivalent,PF 6+ MOS(egg free) 01/11/2023   Influenza,inj,Quad PF,6+ Mos 12/01/2017, 01/06/2021   Influenza-Unspecified 01/20/2017, 01/01/2019, 02/29/2020, 01/16/2022, 01/28/2022   PFIZER(Purple Top)SARS-COV-2 Vaccination 05/07/2019, 05/28/2019, 12/21/2019, 08/19/2020   PPD Test 08/19/2020  Zoster Recombinant(Shingrix) 01/06/2021, 03/08/2021   Zoster, Live 04/18/2021    Health Maintenance  Topic Date Due   DTaP/Tdap/Td (1 - Tdap) Never done   Cervical Cancer Screening (HPV/Pap Cotest)  12/14/2020   COVID-19 Vaccine (5 - 2024-25 season) 12/18/2022   MAMMOGRAM  09/01/2023 (Originally 12/15/2018)   Colonoscopy  10/04/2023 (Originally 05/12/2012)   INFLUENZA VACCINE  11/17/2023   Hepatitis C Screening  Completed   HIV Screening  Completed   Zoster Vaccines- Shingrix  Completed   HPV VACCINES  Aged Out   Meningococcal B Vaccine  Aged Out    Discussed health benefits of physical activity, and encouraged her to engage in regular exercise appropriate for her age and condition.  1. Annual physical exam (Primary) Provide labs today as these were already drawn.  Reviewed results and discussed abnormal findings with patient.  Up-to-date on preventative care.  Wellness information provided with AVS.  2. Macrocytosis She did have some mild macrocytosis on her CBC.  As she does not eat red meat, checking B12 and folate.  She does endorse daily alcohol intake but usually 1 alcoholic beverage nightly. - B12 and Folate Panel  3. Generalized anxiety disorder Weaned herself off of fluoxetine  and is currently stable on Wellbutrin  300 mg daily.  No changes in medications.  4. Hypothyroidism due to Hashimoto's thyroiditis TSH was stable.  Continue levothyroxine  88 mcg daily.  5. Elevated  LDL cholesterol level Her total and LDL cholesterol were elevated despite a healthy diet and lifestyle.  Discussed possible genetic factors and this.  Her current ASCVD risk score is 1.3% indicating no need for medication at this time.  Recommend continue to work on a low-fat heart healthy diet and to continue regular intentional exercise.  Return in about 1 year (around 08/22/2024) for annual physical exam or sooner if needed.   Cherre Cornish, NP   The 10-year ASCVD risk score (Arnett DK, et al., 2019) is: 1.3%   Values used to calculate the score:     Age: 46 years     Sex: Female     Is Non-Hispanic African American: No     Diabetic: No     Tobacco smoker: No     Systolic Blood Pressure: 95 mmHg     Is BP treated: No     HDL Cholesterol: 59 mg/dL     Total Cholesterol: 221 mg/dL

## 2023-08-25 ENCOUNTER — Ambulatory Visit

## 2023-08-25 DIAGNOSIS — R29898 Other symptoms and signs involving the musculoskeletal system: Secondary | ICD-10-CM

## 2023-08-25 DIAGNOSIS — M5416 Radiculopathy, lumbar region: Secondary | ICD-10-CM

## 2023-08-25 LAB — B12 AND FOLATE PANEL

## 2023-08-25 NOTE — Therapy (Signed)
 OUTPATIENT PHYSICAL THERAPY THORACOLUMBAR TREATMENT   Patient Name: Kara Cooper MRN: [id number] DOB:01/31/1966, 58 y.o., female Today's Date: 08/25/2023  END OF SESSION:  PT End of Session - 08/25/23 0845     Visit Number 7    Number of Visits 16    Date for PT Re-Evaluation 09/26/23    Authorization Type [employer] co pay  - none    Authorization Time Period 08/21/23-09/19/23    Authorization - Visit Number 2    Authorization - Number of Visits 4    PT Start Time 0845    PT Stop Time 0928    PT Time Calculation (min) 43 min    Activity Tolerance Patient tolerated treatment well                Past Medical History:  Diagnosis Date   Acute right pyelonephritis 04/04/2017   Anemia    in past   Basal cell carcinoma    treated with Moh's procedure   Chicken pox    in past   Headache(784.0)    frequent   History of seasonal allergies    Irregular menses    Migraine    Other and unspecified ovarian cysts    come and go   Postural orthostatic tachycardia syndrome    Dr. Arlester Ladd   UTI (lower urinary tract infection)    history of   Past Surgical History:  Procedure Laterality Date   CERVICAL CONIZATION W/BX  1987, 1988   DILATION AND CURETTAGE OF UTERUS     Patient Active Problem List   Diagnosis Date Noted   Right lumbar radiculitis 07/27/2023   Pulsatile tinnitus of both ears 10/04/2022   Ventricular tachycardia (HCC) 09/01/2022   Generalized anxiety disorder 03/29/2022   Elevated troponin 03/15/2022   UTI (urinary tract infection) 03/15/2022   Anxiety 03/15/2022   Insomnia 03/15/2022   GERD (gastroesophageal reflux disease) 03/15/2022   SVT (supraventricular tachycardia) (HCC) 03/14/2022   Herpes simplex 09/19/2019   Weight gain 09/28/2018   Arthralgia of both hands 01/18/2018   Hypothyroidism due to Hashimoto's thyroiditis 07/05/2017   Chronic fatigue 05/17/2017   Fracture of coronoid process of ulna, left, closed 01/24/2017   Stress reaction 09/27/2013    Routine general medical examination at a health care facility 12/19/2012   Syncope/dysautomia 01/03/2011   Allergic rhinitis 12/29/2008   Migraine with aura 06/19/2008    PCP: Cherre Cornish, NP  REFERRING PROVIDER: Dr Annemarie Kil  REFERRING DIAG: R lumbar radiculitis  Rationale for Evaluation and Treatment: Rehabilitation  THERAPY DIAG:  Right lumbar radiculitis  Other symptoms and signs involving the musculoskeletal system  ONSET DATE: 06/17/23  SUBJECTIVE:  SUBJECTIVE STATEMENT: Patient reports the deep ache in the hip is better, but still having stretching/burning sensation down the back of the right leg. She tried walking a little further and started noticing that pain in the back of the leg again.    EVAL: Patient reports that she started having pain in the R LB and down the R LE ~ 5 weeks ago which may have been due to on line exercises but she is not sure. About 2 weeks ago she was putting mulch in the yard and noticed increased pain after that. Has increased pain with walking up hills or up stairs. Pain has continued in the R LB and LE but she is now also having pain in L LB.  PERTINENT HISTORY:  Denies prior LBP; SBT; hashimoto's disease; fx of L ulnar head 2019  PAIN:  Are you having pain? Yes: NPRS scale: 4/10 Pain location: Rt buttock and posterior thigh  Pain description: burning/stretching  Aggravating factors: walking up hill; stairs; driving; sitting  Relieving factors: nothing   PRECAUTIONS: None   WEIGHT BEARING RESTRICTIONS: No  FALLS:  Has patient fallen in last 6 months? 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  DIAGNOSTIC FINDINGS:  Xray 07/27/23 - results not available   PATIENT SURVEYS:  Modified Oswestry 33/50; 66%    SENSATION:  Numbness and tingling deep R buttock; down R LE to toes    MUSCLE LENGTH: Hamstrings: Right 45 deg; Left 50 deg Thomas test: tight R > L   POSTURE: rounded shoulders, forward head, and flexed trunk   PALPATION: Pain with PA mobs Grade II/III lumbar; R SI to lateral sacral border; piriformis; glut med/min; R > L iliopsoas  08/17/23: mild pain with PA mobs Grade III L4/5; L5/S1 - R SI to lateral sacral border; piriformis; glut med/min; R > L iliopsoas   LUMBAR ROM:   AROM eval  Flexion 60% pulling B LE's posterior thigh  Extension 30% discomfort  Right lateral flexion 55%  Left lateral flexion 45% pain R LB/hip  Right rotation 40%  Left rotation 35% pain R LB/hip    (Blank rows = not tested)  LOWER EXTREMITY ROM:     Active  Right eval Left eval  Hip flexion    Hip extension Limited  Tight   Hip abduction    Hip adduction    Hip internal rotation Tight    Hip external rotation Tight    Knee flexion    Knee extension    Ankle dorsiflexion    Ankle plantarflexion    Ankle inversion    Ankle eversion     (Blank rows = not tested)  LOWER EXTREMITY MMT:    MMT Right eval 08/17/23 Left eval 08/17/23  Hip flexion 4 4 4+ 4+  Hip extension 4- 4- 4 4  Hip abduction 4 4 5 5   Hip adduction      Hip internal  rotation      Hip external rotation      Knee flexion      Knee extension      Ankle dorsiflexion      Ankle  plantarflexion      Ankle inversion      Ankle eversion       (Blank rows = not tested)  LUMBAR SPECIAL TESTS:  Straight leg raise test: Positive R with increased R LE radicular symptoms; Slump test: Negative  08/17/23: Straight leg raise test: Positive R with increased R LE radicular symptoms  FUNCTIONAL TESTS:  5 times sit to stand: 23.61 sec use of UE's for stand to sit and last 2 reps min use of UE's sit to stand  SLS - 10 sec more difficult on R    08/17/23: 5 times sit to stand; 26.9 sec GAIT: Distance walked: 40 ft Assistive device utilized: None Level of assistance: Complete Independence Comments: WFL's  [facility] Adult PT Treatment:                                                DATE: 08/25/23 Therapeutic Exercise: Sciatic nerve glides x 10  Prone pressup x 10  Manual Therapy: Skilled palpation of trigger points Trigger Point Dry Needling  Subsequent Treatment: Instructions reviewed, if requested by the patient, prior to subsequent dry needling treatment.   Patient Verbal Consent Given: Yes Education Handout Provided: Previously Provided Muscles Treated: Rt bicep femoris  Electrical Stimulation Performed: No Treatment Response/Outcome: twitch response elicited    Neuromuscular re-ed: Hip bridge with abduction blue band 2 x 12  Supine TA march 2 x 10  90/90 toe tap 2 x 10 Dead bug x 12  Sidelying hip abduction 2 x 10   Self Care: Seated posture with recommendation for towel roll to reduce slump sitting    [facility] Adult PT Treatment:                                                DATE: 08/22/23  Manual Therapy: Skilled palpation of trigger points  STM Rt gluteals, lumbar paraspinals Trigger Point Dry Needling  Initial Treatment: Pt instructed on Dry Needling rational, procedures, and possible side effects. Pt instructed to expect mild to moderate muscle soreness later in the day and/or into the next day.  Pt instructed in methods to reduce muscle soreness. Pt instructed  to continue prescribed HEP. Patient was educated on signs and symptoms of infection and other risk factors and advised to seek medical attention should they occur.  Patient verbalized understanding of these instructions and education.   Patient Verbal Consent Given: Yes Education Handout Provided: Yes Muscles Treated: Rt gluteals  Electrical Stimulation Performed: No Treatment Response/Outcome: deep twitch response reported    Neuromuscular re-ed: Prone hip extension 2 x 10  Supine TA march 2 x 10  90/90 march 2 x 10  Hip bridge with abduction blue band 2 x 10  Therapeutic Activity: Walking 1 lap in clinic post TPDN   [facility] Adult PT Treatment:  DATE: 08/17/23 Therapeutic Exercise: Prone Prone prop 2-3 min  Prone press up partial range 2-3 sec hold x 10  Standing  Extension x 10  Supine Diaphragmatic breathing  4 part core  Sciatic nerve glide 2-3 sec x 10 R Manual Therapy: PA mobs through lumbar spine Grande II/III STM R posterior hip through piriformis and gluts; lumbar paraspinals/lats PROM IR/ER R LE hip extended knee flexed x 10 - note tightness with IR > ER  Therapeutic Activity: Prone Prone prop 2-3 min  Prone press up partial range 2-3 sec hold x 10  Prone press up with overpressure by PT x 5  Supine Piriformis stretch 30 sec x 3  Dead bug alternating arms and legs x 10 x 2  Trial of table top LE's tolerated ~ 15 sec; added trial of heel tap created back pain Shoulder flexion hooklying yellow TB  core engaged 10 x 2  Shoulder horizontal abduction hooklying yellow TB core engaged 10 x 2  Bridge 5 sec x 10  Hamstring stretch supine hooklying with strap 30 sec x 3 R  Standing   Row green TB core engaged 3 sec x 10  Shoulder extension green TB core engaged 3 sec x 10  Self Care: Myofacial ball release work standing prone at hip flexors ~ 1 min some LE radicular numbness resolved quickly Myofacial ball release working  through the R posterior hip; sitting with ball hamstring origin    PATIENT EDUCATION:  Education details: see treatment Person educated: Patient Education method: Programmer, multimedia,  Education comprehension: verbalized understanding,   HOME EXERCISE PROGRAM: Access Code: [email or web address] URL: [email or web address] Date: 08/25/2023 Prepared by: Forrestine Ike  Exercises - Cat Cow to Child's Pose  - 2 x daily - 7 x weekly - 1 sets - 3 reps - 30 sec  hold - Supine Sciatic Nerve Glide  - 2 x daily - 7 x weekly - 1 sets - 8-10 reps - 1-2 sec  hold - Supine Diaphragmatic Breathing  - 2 x daily - 7 x weekly - 1 sets - 10 reps - 4-6 sec  hold - Prone Press Up  - 2 x daily - 7 x weekly - 1 sets - 10 reps - 2-3 sec  hold - Supine Piriformis Stretch with Leg Straight  - 2 x daily - 7 x weekly - 1 sets - 3 reps - 30 sec  hold - Seated Thoracic Lumbar Extension  - 2 x daily - 7 x weekly - 1 sets - 3-5 reps - 5-10 sec  hold - Hooklying Hamstring Stretch with Strap  - 2 x daily - 7 x weekly - 1 sets - 3 reps - 30 sec  hold - Standing Lumbar Extension with Counter  - 6 x daily - 7 x weekly - 1 sets - 10 reps - Supine Shoulder Flexion Extension AAROM with Dowel  - 1 x daily - 7 x weekly - 1-2 sets - 10 reps - 3 sec  hold - Supine Shoulder Horizontal Abduction with Resistance  - 1 x daily - 7 x weekly - 1-3 sets - 10 reps - 3-5 sec  hold - Standing Bilateral Low Shoulder Row with Anchored Resistance  - 1 x daily - 7 x weekly - 1-3 sets - 10 reps - 2-3 sec  hold - Shoulder extension with resistance - Neutral  - 1 x daily - 7 x weekly - 1-2 sets - 10 reps - 3-5 sec  hold - Bridge with Hip Abduction and  Resistance  - 1 x daily - 7 x weekly - 2 sets - 10 reps - Dead Bug  - 1 x daily - 7 x weekly - 2 sets - 10 reps - Sidelying Hip Abduction  - 1 x daily - 7 x weekly - 2 sets - 10 reps  ASSESSMENT:  CLINICAL IMPRESSION: Patient localizes pain to Rt posterior thigh upon arrival. TPDN performed to bicep  femoris with excellent twitch response elicited and soreness reported post-intervention. Focused on dynamic core stabilization with patient challenged in maintaining core activation with 90/90 progression.   EVAL: Patient is a 58 y.o. female who was seen today for physical therapy evaluation and treatment for R lumbar radiculitis. She reports no specific injury but may have irritated LB with on line exercise program ~ 5 weeks ago. She further irritated symptoms ~ 2 weeks ago putting mulch in her yard. Patient presents with limited trunk and LE mobility/ROM; decreased strength R LE possibly due to pain and abnormal movement; muscular tightness to palpation; radicular pain R LE; poor movement patterns; decrease endurance for ADL's and functional activities. Patient will benefit from physical therapy to address problems identified. Patient responded well to initial intervention reporting decreased radicular pain and improved mobility.    GOALS: Goals reviewed with patient? Yes  SHORT TERM GOALS: Target date: 08/29/2023   Independent in initial HEP  Baseline: Goal status: met  2.  Decrease R LE radicular symptoms by 50%  Baseline:  Goal status: on going   3.  Patient will demonstrate and verbalize proper techniques for back care principles  Baseline:  Goal status: on going    LONG TERM GOALS: Target date: 09/26/2023   Decrease pain by 75-100% allowing patient to return to normal functional activities  Baseline:  Goal status: met   2.  LE strength 4+/5 to 5/5  Baseline:  Goal status: on going   3.  Lumbar ROM WFL and pain free  Baseline:  Goal status: on going   4.  Patient tolerates sitting, standing, walking for >/= 1 hour with no increase in LBP or radicular pain  Baseline:  Goal status: on going   5.  Independent in HEP including aquatic program as indicated  Baseline:  Goal status: on going   6.  Improve Owestry by 10-20 points  Baseline: 33/50; 66% Goal status: on  going   PLAN:  PT FREQUENCY: 2x/week  PT DURATION: 8 weeks  PLANNED INTERVENTIONS: 97110-Therapeutic exercises, 97530- Therapeutic activity, 97112- Neuromuscular re-education, 97535- Self Care, 16109- Manual therapy, [phone]- Aquatic Therapy, Patient/Family education, Balance training, Stair training, Taping, Dry Needling, Joint mobilization, Cryotherapy, and Moist heat.  PLAN FOR NEXT SESSION: review and progress exercises; continue with back care and spine education; manual work and modalities as indicated; inclined treadmill when appropriate   Luceal Hollibaugh, PT, DPT, ATC 08/25/23 9:29 AM

## 2023-08-28 ENCOUNTER — Ambulatory Visit

## 2023-08-28 DIAGNOSIS — R29898 Other symptoms and signs involving the musculoskeletal system: Secondary | ICD-10-CM

## 2023-08-28 DIAGNOSIS — M5416 Radiculopathy, lumbar region: Secondary | ICD-10-CM | POA: Diagnosis not present

## 2023-08-28 NOTE — Therapy (Signed)
 OUTPATIENT PHYSICAL THERAPY THORACOLUMBAR TREATMENT   Patient Name: Kara Cooper MRN: [id number] DOB:Dec 20, 1965, 58 y.o., female Today's Date: 08/28/2023  END OF SESSION:  PT End of Session - 08/28/23 1531     Visit Number 8    Number of Visits 16    Date for PT Re-Evaluation 09/26/23    Authorization Type [employer] co pay  - none    Authorization Time Period 08/21/23-09/19/23    Authorization - Visit Number 3    Authorization - Number of Visits 4    PT Start Time 1531    PT Stop Time 1615    PT Time Calculation (min) 44 min    Activity Tolerance Patient tolerated treatment well                 Past Medical History:  Diagnosis Date   Acute right pyelonephritis 04/04/2017   Anemia    in past   Basal cell carcinoma    treated with Moh's procedure   Chicken pox    in past   Headache(784.0)    frequent   History of seasonal allergies    Irregular menses    Migraine    Other and unspecified ovarian cysts    come and go   Postural orthostatic tachycardia syndrome    Dr. Arlester Ladd   UTI (lower urinary tract infection)    history of   Past Surgical History:  Procedure Laterality Date   CERVICAL CONIZATION W/BX  1987, 1988   DILATION AND CURETTAGE OF UTERUS     Patient Active Problem List   Diagnosis Date Noted   Right lumbar radiculitis 07/27/2023   Pulsatile tinnitus of both ears 10/04/2022   Ventricular tachycardia (HCC) 09/01/2022   Generalized anxiety disorder 03/29/2022   Elevated troponin 03/15/2022   UTI (urinary tract infection) 03/15/2022   Anxiety 03/15/2022   Insomnia 03/15/2022   GERD (gastroesophageal reflux disease) 03/15/2022   SVT (supraventricular tachycardia) (HCC) 03/14/2022   Herpes simplex 09/19/2019   Weight gain 09/28/2018   Arthralgia of both hands 01/18/2018   Hypothyroidism due to Hashimoto's thyroiditis 07/05/2017   Chronic fatigue 05/17/2017   Fracture of coronoid process of ulna, left, closed 01/24/2017   Stress reaction  09/27/2013   Routine general medical examination at a health care facility 12/19/2012   Syncope/dysautomia 01/03/2011   Allergic rhinitis 12/29/2008   Migraine with aura 06/19/2008    PCP: Cherre Cornish, NP  REFERRING PROVIDER: Dr Annemarie Kil  REFERRING DIAG: R lumbar radiculitis  Rationale for Evaluation and Treatment: Rehabilitation  THERAPY DIAG:  Right lumbar radiculitis  Other symptoms and signs involving the musculoskeletal system  ONSET DATE: 06/17/23  SUBJECTIVE:  SUBJECTIVE STATEMENT: Patient reports the shooting pain in the back of the thigh has resolved since last session. Still has deep ache in the posterior hip. The anterior thigh has been aching since a walk on Saturday.    EVAL: Patient reports that she started having pain in the R LB and down the R LE ~ 5 weeks ago which may have been due to on line exercises but she is not sure. About 2 weeks ago she was putting mulch in the yard and noticed increased pain after that. Has increased pain with walking up hills or up stairs. Pain has continued in the R LB and LE but she is now also having pain in L LB.  PERTINENT HISTORY:  Denies prior LBP; SBT; hashimoto's disease; fx of L ulnar head 2019  PAIN:  Are you having pain? Yes: NPRS scale: 2/10 Pain location: Rt buttock and anterior thigh  Pain description: deep, ache  Aggravating factors: walking up hill; stairs; driving; sitting  Relieving factors: nothing   PRECAUTIONS: None   WEIGHT BEARING RESTRICTIONS: No  FALLS:  Has patient fallen in last 6 months? 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  DIAGNOSTIC FINDINGS:  Xray 07/27/23 - results not available   PATIENT SURVEYS:  Modified Oswestry 33/50; 66%    SENSATION:  Numbness and tingling deep R buttock; down R LE to toes    MUSCLE LENGTH: Hamstrings: Right 45 deg; Left 50 deg Thomas test: tight R > L   POSTURE: rounded shoulders, forward head, and flexed trunk   PALPATION: Pain with PA mobs Grade II/III lumbar; R SI to lateral sacral border; piriformis; glut med/min; R > L iliopsoas  08/17/23: mild pain with PA mobs Grade III L4/5; L5/S1 - R SI to lateral sacral border; piriformis; glut med/min; R > L iliopsoas   LUMBAR ROM:   AROM eval  Flexion 60% pulling B LE's posterior thigh  Extension 30% discomfort  Right lateral flexion 55%  Left lateral flexion 45% pain R LB/hip  Right rotation 40%  Left rotation 35% pain R LB/hip    (Blank rows = not tested)  LOWER EXTREMITY ROM:     Active  Right eval Left eval  Hip flexion    Hip extension Limited  Tight   Hip abduction    Hip adduction    Hip internal rotation Tight    Hip external rotation Tight    Knee flexion    Knee extension    Ankle dorsiflexion    Ankle plantarflexion    Ankle inversion    Ankle eversion     (Blank rows = not tested)  LOWER EXTREMITY MMT:    MMT Right eval 08/17/23 Left eval 08/17/23  Hip flexion 4 4 4+ 4+  Hip extension 4- 4- 4 4  Hip abduction 4 4 5 5   Hip adduction      Hip internal rotation  Hip external rotation      Knee flexion      Knee extension      Ankle dorsiflexion      Ankle plantarflexion      Ankle  inversion      Ankle eversion       (Blank rows = not tested)  LUMBAR SPECIAL TESTS:  Straight leg raise test: Positive R with increased R LE radicular symptoms; Slump test: Negative  08/17/23: Straight leg raise test: Positive R with increased R LE radicular symptoms  FUNCTIONAL TESTS:  5 times sit to stand: 23.61 sec use of UE's for stand to sit and last 2 reps min use of UE's sit to stand  SLS - 10 sec more difficult on R    08/17/23: 5 times sit to stand; 26.9 sec GAIT: Distance walked: 40 ft Assistive device utilized: None Level of assistance: Complete Independence Comments: WFL's  [facility] Adult PT Treatment:                                                DATE: 08/28/23 Therapeutic Exercise: Sciatic nerve glides x 10  LTR with figure 4 x 1 minute each  Manual Therapy: Skilled palpation of trigger points  Trigger Point Dry Needling  Subsequent Treatment: Instructions reviewed, if requested by the patient, prior to subsequent dry needling treatment.   Patient Verbal Consent Given: Yes Education Handout Provided: Previously Provided Muscles Treated: Rt gluteals, Rt vastus lateralis, rectus femoris, vastus intermedius  Electrical Stimulation Performed: No Treatment Response/Outcome: twitch response elicited    Neuromuscular re-ed: 90/90 toe tap x 10 Dead bug 2 x 10  Standing 3 way hip x 10 each  Squats x 10     [facility] Adult PT Treatment:                                                DATE: 08/25/23 Therapeutic Exercise: Sciatic nerve glides x 10  Prone pressup x 10  Manual Therapy: Skilled palpation of trigger points Trigger Point Dry Needling  Subsequent Treatment: Instructions reviewed, if requested by the patient, prior to subsequent dry needling treatment.   Patient Verbal Consent Given: Yes Education Handout Provided: Previously Provided Muscles Treated: Rt bicep femoris  Electrical Stimulation Performed: No Treatment Response/Outcome: twitch response elicited     Neuromuscular re-ed: Hip bridge with abduction blue band 2 x 12  Supine TA march 2 x 10  90/90 toe tap 2 x 10 Dead bug x 12  Sidelying hip abduction 2 x 10   Self Care: Seated posture with recommendation for towel roll to reduce slump sitting    [facility] Adult PT Treatment:                                                DATE: 08/22/23  Manual Therapy: Skilled palpation of trigger points  STM Rt gluteals, lumbar paraspinals Trigger Point Dry Needling  Initial Treatment: Pt instructed on Dry Needling rational, procedures, and possible side effects. Pt instructed to expect mild to moderate muscle soreness later in the day and/or into the next day.  Pt instructed in methods to reduce muscle soreness. Pt instructed to continue prescribed HEP. Patient was educated on signs and symptoms of infection and other risk factors and advised to seek medical attention should they occur.  Patient verbalized understanding of these instructions and education.   Patient Verbal Consent Given: Yes Education Handout Provided: Yes Muscles Treated: Rt gluteals  Electrical Stimulation Performed: No Treatment Response/Outcome: deep twitch response reported    Neuromuscular re-ed: Prone hip extension 2 x 10  Supine TA march 2 x 10  90/90 march 2 x 10  Hip bridge with abduction blue band 2 x 10  Therapeutic Activity: Walking 1 lap in clinic post TPDN   PATIENT EDUCATION:  Education details: see treatment Person educated: Patient Education method: Explanation,  Education comprehension: verbalized understanding,   HOME EXERCISE PROGRAM: Access Code: [email or web address] URL: [email or web address] Date: 08/25/2023 Prepared by: Forrestine Ike  Exercises - Cat Cow to Child's Pose  - 2 x daily - 7 x weekly - 1 sets - 3 reps - 30 sec  hold - Supine Sciatic Nerve Glide  - 2 x daily - 7 x weekly - 1 sets - 8-10 reps - 1-2 sec  hold - Supine Diaphragmatic Breathing  - 2 x daily - 7 x weekly - 1  sets - 10 reps - 4-6 sec  hold - Prone Press Up  - 2 x daily - 7 x weekly - 1 sets - 10 reps - 2-3 sec  hold - Supine Piriformis Stretch with Leg Straight  - 2 x daily - 7 x weekly - 1 sets - 3 reps - 30 sec  hold - Seated Thoracic Lumbar Extension  - 2 x daily - 7 x weekly - 1 sets - 3-5 reps - 5-10 sec  hold - Hooklying Hamstring Stretch with Strap  - 2 x daily - 7 x weekly - 1 sets - 3 reps - 30 sec  hold - Standing Lumbar Extension with Counter  - 6 x daily - 7 x weekly - 1 sets - 10 reps - Supine Shoulder Flexion Extension AAROM with Dowel  - 1 x daily - 7 x weekly - 1-2 sets - 10 reps - 3 sec  hold - Supine Shoulder Horizontal Abduction with Resistance  - 1 x daily - 7 x weekly - 1-3 sets - 10 reps - 3-5 sec  hold - Standing Bilateral Low Shoulder Row with Anchored Resistance  - 1 x daily - 7 x weekly - 1-3 sets - 10 reps - 2-3 sec  hold - Shoulder extension with resistance - Neutral  - 1 x daily - 7 x weekly - 1-2 sets - 10 reps - 3-5 sec  hold - Bridge with Hip Abduction and Resistance  - 1 x daily - 7 x weekly - 2 sets - 10 reps - Dead Bug  - 1 x daily - 7 x weekly - 2 sets - 10 reps - Sidelying Hip Abduction  - 1 x daily - 7 x weekly - 2 sets - 10 reps  ASSESSMENT:  CLINICAL IMPRESSION: Patient localizes pain to Rt posterolateral hip and anterior thigh upon arrival. TPDN performed to gluteals and quadriceps with twitch response elicited. Continued with dynamic core stabilization with patient demonstrating overall improvements in core endurance. Challenged with standing 3 way hip during stance on the RLE requiring use of UE support to maintain stability. With squats she has good form through partial range, but with increased depth compensates with lumbar lordosis requiring cues  to allow for hip flexion.   EVAL: Patient is a 58 y.o. female who was seen today for physical therapy evaluation and treatment for R lumbar radiculitis. She reports no specific injury but may have irritated LB with  on line exercise program ~ 5 weeks ago. She further irritated symptoms ~ 2 weeks ago putting mulch in her yard. Patient presents with limited trunk and LE mobility/ROM; decreased strength R LE possibly due to pain and abnormal movement; muscular tightness to palpation; radicular pain R LE; poor movement patterns; decrease endurance for ADL's and functional activities. Patient will benefit from physical therapy to address problems identified. Patient responded well to initial intervention reporting decreased radicular pain and improved mobility.    GOALS: Goals reviewed with patient? Yes  SHORT TERM GOALS: Target date: 08/29/2023   Independent in initial HEP  Baseline: Goal status: met  2.  Decrease R LE radicular symptoms by 50%  Baseline:  Goal status: on going   3.  Patient will demonstrate and verbalize proper techniques for back care principles  Baseline:  Goal status: on going    LONG TERM GOALS: Target date: 09/26/2023   Decrease pain by 75-100% allowing patient to return to normal functional activities  Baseline:  Goal status: met   2.  LE strength 4+/5 to 5/5  Baseline:  Goal status: on going   3.  Lumbar ROM WFL and pain free  Baseline:  Goal status: on going   4.  Patient tolerates sitting, standing, walking for >/= 1 hour with no increase in LBP or radicular pain  Baseline:  Goal status: on going   5.  Independent in HEP including aquatic program as indicated  Baseline:  Goal status: on going   6.  Improve Owestry by 10-20 points  Baseline: 33/50; 66% Goal status: on going   PLAN:  PT FREQUENCY: 2x/week  PT DURATION: 8 weeks  PLANNED INTERVENTIONS: 97110-Therapeutic exercises, 97530- Therapeutic activity, 97112- Neuromuscular re-education, 97535- Self Care, 16109- Manual therapy, [phone]- Aquatic Therapy, Patient/Family education, Balance training, Stair training, Taping, Dry Needling, Joint mobilization, Cryotherapy, and Moist heat.  PLAN FOR NEXT  SESSION: review and progress exercises; continue with back care and spine education; manual work and modalities as indicated; inclined treadmill when appropriate; REQUEST MORE VISITS   Forrestine Ike, PT, DPT, ATC 08/28/23 4:17 PM

## 2023-08-31 ENCOUNTER — Encounter: Payer: Self-pay | Admitting: Rehabilitative and Restorative Service Providers"

## 2023-08-31 ENCOUNTER — Ambulatory Visit: Admitting: Rehabilitative and Restorative Service Providers"

## 2023-08-31 DIAGNOSIS — M5416 Radiculopathy, lumbar region: Secondary | ICD-10-CM

## 2023-08-31 DIAGNOSIS — R29898 Other symptoms and signs involving the musculoskeletal system: Secondary | ICD-10-CM | POA: Diagnosis not present

## 2023-08-31 NOTE — Therapy (Signed)
 OUTPATIENT PHYSICAL THERAPY THORACOLUMBAR TREATMENT PHYSICAL THERAPY DISCHARGE SUMMARY  Visits from Start of Care: 9  Current functional level related to goals / functional outcomes: See progress note   Remaining deficits: Intermittent R hamstring discomfort/tightness - needs to continue with stretching as instructed today   Education / Equipment: HEP    Patient agrees to discharge. Patient goals were met. Patient is being discharged due to meeting the stated rehab goals.  Jadaya Sommerfield P. Geraldo Klippel PT, MPH 08/31/23 4:58 PM     Patient Name: Kara Cooper MRN: [id number] DOB:[birth year], 58 y.o., female Today's Date: 08/31/2023  END OF SESSION:  PT End of Session - 08/31/23 1610     Visit Number 9    Number of Visits 16    Date for PT Re-Evaluation 09/26/23    Authorization Type [employer] co pay  - none    Authorization Time Period 08/21/23-09/19/23    Authorization - Visit Number 4    Authorization - Number of Visits 4    PT Start Time 1610    PT Stop Time 1655    PT Time Calculation (min) 45 min                 Past Medical History:  Diagnosis Date   Acute right pyelonephritis 04/04/2017   Anemia    in past   Basal cell carcinoma    treated with Moh's procedure   Chicken pox    in past   Headache(784.0)    frequent   History of seasonal allergies    Irregular menses    Migraine    Other and unspecified ovarian cysts    come and go   Postural orthostatic tachycardia syndrome    Dr. Arlester Ladd   UTI (lower urinary tract infection)    history of   Past Surgical History:  Procedure Laterality Date   CERVICAL CONIZATION W/BX  1987, 1988   DILATION AND CURETTAGE OF UTERUS     Patient Active Problem List   Diagnosis Date Noted   Right lumbar radiculitis 07/27/2023   Pulsatile tinnitus of both ears 10/04/2022   Ventricular tachycardia (HCC) 09/01/2022   Generalized anxiety disorder 03/29/2022   Elevated troponin 03/15/2022   UTI (urinary tract infection) 03/15/2022    Anxiety 03/15/2022   Insomnia 03/15/2022   GERD (gastroesophageal reflux disease) 03/15/2022   SVT (supraventricular tachycardia) (HCC) 03/14/2022   Herpes simplex 09/19/2019   Weight gain 09/28/2018   Arthralgia of both hands 01/18/2018   Hypothyroidism due to Hashimoto's thyroiditis 07/05/2017   Chronic fatigue 05/17/2017   Fracture of coronoid process of ulna, left, closed 01/24/2017   Stress reaction 09/27/2013   Routine general medical examination at a health care facility 12/19/2012   Syncope/dysautomia 01/03/2011   Allergic rhinitis 12/29/2008   Migraine with aura 06/19/2008    PCP: Cherre Cornish, NP  REFERRING PROVIDER: Dr Annemarie Kil  REFERRING DIAG: R lumbar radiculitis  Rationale for Evaluation and Treatment: Rehabilitation  THERAPY DIAG:  Right lumbar radiculitis  Other symptoms and signs involving the musculoskeletal system  ONSET DATE: 06/17/23  SUBJECTIVE:  SUBJECTIVE STATEMENT: Patient reports that symptoms are significantly improved. She continues to have occasional shooting pain in the posterior thing and the tightness behind the knee. She has deep ache in the the posterior hip. Feels confident in continuing with independent HEP.    EVAL: Patient reports that she started having pain in the R LB and down the R LE ~ 5 weeks ago which may have been due to on line exercises but she is not sure. About 2 weeks ago she was putting mulch in the yard and noticed increased pain after that. Has increased pain with walking up hills or up stairs. Pain has continued in the R LB and LE but she is now also having pain in L LB.  PERTINENT HISTORY:  Denies prior LBP; SBT; hashimoto's disease; fx of L ulnar head 2019  PAIN:  Are you having pain? Yes: NPRS scale: 0/10 Pain location: Rt  buttock and anterior thigh  Pain description: deep, ache  Aggravating factors: walking up hill; stairs; driving; sitting  Relieving factors: nothing   PRECAUTIONS: None   WEIGHT BEARING RESTRICTIONS: No  FALLS:  Has patient fallen in last 6 months? 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  DIAGNOSTIC FINDINGS:  Xray 07/27/23 - results not available   PATIENT SURVEYS:  Modified Oswestry 33/50; 66%  08/31/23: 12/50; 24%    SENSATION:  Numbness and tingling deep R buttock; down R LE to toes    MUSCLE LENGTH: Hamstrings: Right 45 deg; Left 50 deg Thomas test: tight R > L   POSTURE: rounded shoulders, forward head, and flexed trunk   PALPATION: Pain with PA mobs Grade II/III lumbar; R SI to lateral sacral border; piriformis; glut med/min; R > L iliopsoas  08/17/23: mild pain with PA mobs Grade III L4/5; L5/S1 - R SI to lateral sacral border; piriformis; glut med/min; R > L iliopsoas  08/31/23: no pain with PA mobs through lumbar spine; minimal tightness noted through the posterior R hip  LUMBAR ROM:   AROM eval 08/31/23  Flexion 60% pulling B LE's posterior thigh 70% Pull posterior thighs   Extension 30% discomfort 50%  Right lateral flexion 55% 80%  Left lateral flexion 45% pain R LB/hip 80%  Right rotation 40% 40%  Left rotation 35% pain R LB/hip  40% tight no pain    (Blank rows = not  tested)  LOWER EXTREMITY ROM:     Active  Right eval Left eval  Hip flexion    Hip extension Limited  Tight   Hip abduction    Hip adduction    Hip internal rotation Tight    Hip external rotation Tight    Knee flexion    Knee extension    Ankle dorsiflexion    Ankle plantarflexion    Ankle inversion    Ankle eversion     (Blank rows = not tested) 08/31/23: WFL's    LOWER EXTREMITY  MMT:    MMT Right eval 08/17/23 08/31/23 Left eval 08/17/23 08/31/23  Hip flexion 4 4 5  4+ 4+ 5  Hip extension 4- 4- 5 4 4 5   Hip abduction 4 4 5 5 5 5   Hip adduction        Hip internal rotation        Hip external rotation        Knee flexion        Knee extension        Ankle dorsiflexion        Ankle plantarflexion        Ankle inversion        Ankle eversion         (Blank rows = not tested)  LUMBAR SPECIAL TESTS:  Straight leg raise test: Positive R with increased R LE radicular symptoms; Slump test: Negative  08/17/23: Straight leg raise test: Positive R with increased R LE radicular symptoms  FUNCTIONAL TESTS:  5 times sit to stand: 23.61 sec use of UE's for stand to sit and last 2 reps min use of UE's sit to stand  SLS - 10 sec more difficult on R    08/17/23: 5 times sit to stand; 26.9 sec 08/31/23: GAIT: Distance walked: 40 ft Assistive device utilized: None Level of assistance: Complete Independence Comments: WFL's    [facility] Adult PT Treatment:                                                DATE: 08/31/23 Therapeutic Exercise: Prone Prone prop 2-3 min  Prone press up partial range 2-3 sec hold x 10  Standing  Extension x 10  Supine Diaphragmatic breathing  4 part core  Sciatic nerve glide 2-3 sec x 10 R Hamstring stretch 30 sec x 3 ITB stretch 30 sec x 3  Manual Therapy: PA mobs through lumbar spine Grande II/III STM R posterior hip through piriformis and gluts; lumbar paraspinals/lats PROM IR/ER R LE hip extended knee flexed x 10 - note tightness with IR > ER   Therapeutic Activity: Supine Piriformis stretch 30 sec x 3  Dead bug alternating arms and legs x 10 x 2  Trial of table top LE's tolerated ~ 15 sec; added trial of heel tap created back pain Shoulder flexion hooklying yellow TB  core engaged 10 x 2  Shoulder horizontal abduction hooklying yellow TB core engaged 10 x 2  Bridge 5 sec x 10  Hamstring stretch supine hooklying with strap 30 sec x 3 R  Standing   Row blue TB core engaged 3 sec x 10  Shoulder extension blueTB core engaged 3 sec x 10  Antirotation blue TB 3 sec x 10 - instructed in progression of exercises for home   Self Care: (review for home)  Myofacial ball release work standing prone at hip flexors  Myofacial ball release working through the R posterior hip; sitting with ball hamstrings  Back care - avoid rotation of spine especially with bent forward positions Discussed home exercise program that she has been using - suggested she avoid the rotational exercises especially with resistance of bands   [facility] Adult PT Treatment:  DATE: 08/28/23 Therapeutic Exercise: Sciatic nerve glides x 10  LTR with figure 4 x 1 minute each  Manual Therapy: Skilled palpation of trigger points  Trigger Point Dry Needling  Subsequent Treatment: Instructions reviewed, if requested by the patient, prior to subsequent dry needling treatment.   Patient Verbal Consent Given: Yes Education Handout Provided: Previously Provided Muscles Treated: Rt gluteals, Rt vastus lateralis, rectus femoris, vastus intermedius  Electrical Stimulation Performed: No Treatment Response/Outcome: twitch response elicited    Neuromuscular re-ed: 90/90 toe tap x 10 Dead bug 2 x 10  Standing 3 way hip x 10 each  Squats x 10     [facility] Adult PT Treatment:                                                DATE: 08/25/23 Therapeutic Exercise: Sciatic nerve glides x 10  Prone pressup x 10  Manual Therapy: Skilled  palpation of trigger points Trigger Point Dry Needling  Subsequent Treatment: Instructions reviewed, if requested by the patient, prior to subsequent dry needling treatment.   Patient Verbal Consent Given: Yes Education Handout Provided: Previously Provided Muscles Treated: Rt bicep femoris  Electrical Stimulation Performed: No Treatment Response/Outcome: twitch response elicited    Neuromuscular re-ed: Hip bridge with abduction blue band 2 x 12  Supine TA march 2 x 10  90/90 toe tap 2 x 10 Dead bug x 12  Sidelying hip abduction 2 x 10   Self Care: Seated posture with recommendation for towel roll to reduce slump sitting    [facility] Adult PT Treatment:                                                DATE: 08/22/23  Manual Therapy: Skilled palpation of trigger points  STM Rt gluteals, lumbar paraspinals Trigger Point Dry Needling  Initial Treatment: Pt instructed on Dry Needling rational, procedures, and possible side effects. Pt instructed to expect mild to moderate muscle soreness later in the day and/or into the next day.  Pt instructed in methods to reduce muscle soreness. Pt instructed to continue prescribed HEP. Patient was educated on signs and symptoms of infection and other risk factors and advised to seek medical attention should they occur.  Patient verbalized understanding of these instructions and education.   Patient Verbal Consent Given: Yes Education Handout Provided: Yes Muscles Treated: Rt gluteals  Electrical Stimulation Performed: No Treatment Response/Outcome: deep twitch response reported    Neuromuscular re-ed: Prone hip extension 2 x 10  Supine TA march 2 x 10  90/90 march 2 x 10  Hip bridge with abduction blue band 2 x 10  Therapeutic Activity: Walking 1 lap in clinic post TPDN   PATIENT EDUCATION:  Education details: see treatment Person educated: Patient Education method: Explanation,  Education comprehension: verbalized understanding,    HOME EXERCISE PROGRAM: Access Code: [email or web address] URL: [email or web address] Date: 08/31/2023 Prepared by: Talor Desrosiers  Exercises - Cat Cow to Child's Pose  - 2 x daily - 7 x weekly - 1 sets - 3 reps - 30 sec  hold - Supine Sciatic Nerve Glide  - 2 x daily - 7 x weekly - 1 sets - 8-10 reps - 1-2 sec  hold - Supine  Diaphragmatic Breathing  - 2 x daily - 7 x weekly - 1 sets - 10 reps - 4-6 sec  hold - Prone Press Up  - 2 x daily - 7 x weekly - 1 sets - 10 reps - 2-3 sec  hold - Supine Piriformis Stretch with Leg Straight  - 2 x daily - 7 x weekly - 1 sets - 3 reps - 30 sec  hold - Seated Thoracic Lumbar Extension  - 2 x daily - 7 x weekly - 1 sets - 3-5 reps - 5-10 sec  hold - Hooklying Hamstring Stretch with Strap  - 2 x daily - 7 x weekly - 1 sets - 3 reps - 30 sec  hold - Standing Lumbar Extension with Counter  - 6 x daily - 7 x weekly - 1 sets - 10 reps - Supine Shoulder Flexion Extension AAROM with Dowel  - 1 x daily - 7 x weekly - 1-2 sets - 10 reps - 3 sec  hold - Supine Shoulder Horizontal Abduction with Resistance  - 1 x daily - 7 x weekly - 1-3 sets - 10 reps - 3-5 sec  hold - Standing Bilateral Low Shoulder Row with Anchored Resistance  - 1 x daily - 7 x weekly - 1-3 sets - 10 reps - 2-3 sec  hold - Shoulder extension with resistance - Neutral  - 1 x daily - 7 x weekly - 1-2 sets - 10 reps - 3-5 sec  hold - Bridge with Hip Abduction and Resistance  - 1 x daily - 7 x weekly - 2 sets - 10 reps - Dead Bug  - 1 x daily - 7 x weekly - 2 sets - 10 reps - Sidelying Hip Abduction  - 1 x daily - 7 x weekly - 2 sets - 10 reps - Supine ITB Stretch with Strap  - 1 x daily - 7 x weekly - 1 sets - 3 reps - 30 sec  hold - Seated Table Hamstring Stretch  - 1 x daily - 7 x weekly - 1 sets - 3 reps - 30 sec  hold - Seated Hamstring Stretch  - 2 x daily - 7 x weekly - 1 sets - 3 reps - 30 sec  hold - Anti-Rotation Lateral Stepping with Press  - 2 x daily - 7 x weekly - 1-2 sets - 10 reps -  2-3 sec  hold  ASSESSMENT:  CLINICAL IMPRESSION: Patient reports and demonstrates good mobility in trunk: 5/5 strength bilat LE's: resolving radicular pain R LE; improved functional activity level. She has some persistent tightness and discomfort in R hamstring which was addressed today with specific stretching for lateral hamstring. Patient has accomplished goals of therapy and will be discharged to independent HEP.   EVAL: Patient is a 58 y.o. female who was seen today for physical therapy evaluation and treatment for R lumbar radiculitis. She reports no specific injury but may have irritated LB with on line exercise program ~ 5 weeks ago. She further irritated symptoms ~ 2 weeks ago putting mulch in her yard. Patient presents with limited trunk and LE mobility/ROM; decreased strength R LE possibly due to pain and abnormal movement; muscular tightness to palpation; radicular pain R LE; poor movement patterns; decrease endurance for ADL's and functional activities. Patient will benefit from physical therapy to address problems identified. Patient responded well to initial intervention reporting decreased radicular pain and improved mobility.    GOALS: Goals reviewed with patient? Yes  SHORT TERM GOALS: Target date: 08/29/2023   Independent in initial HEP  Baseline: Goal status: met  2.  Decrease R LE radicular symptoms by 50%  Baseline:  Goal status: met   3.  Patient will demonstrate and verbalize proper techniques for back care principles  Baseline:  Goal status: met    LONG TERM GOALS: Target date: 09/26/2023   Decrease pain by 75-100% allowing patient to return to normal functional activities  Baseline:  Goal status: met   2.  LE strength 4+/5 to 5/5  Baseline:  Goal status: met   3.  Lumbar ROM WFL and pain free  Baseline:  Goal status: met   4.  Patient tolerates sitting, standing, walking for >/= 1 hour with no increase in LBP or radicular pain  Baseline:  Goal  status: partially accomplished    5.  Independent in HEP including aquatic program as indicated  Baseline:  Goal status: met   6.  Improve Owestry by 10-20 points  Baseline: 33/50; 66% 08/31/23: 12/50; 24%  Goal status: met   PLAN:  PT FREQUENCY: 2x/week  PT DURATION: 8 weeks  PLANNED INTERVENTIONS: 97110-Therapeutic exercises, 97530- Therapeutic activity, 97112- Neuromuscular re-education, 97535- Self Care, 28413- Manual therapy, [phone]- Aquatic Therapy, Patient/Family education, Balance training, Stair training, Taping, Dry Needling, Joint mobilization, Cryotherapy, and Moist heat.  PLAN FOR NEXT SESSION: review and progress exercises; continue with back care and spine education; manual work and modalities as indicated; inclined treadmill when appropriate; REQUEST MORE VISITS   Kiamesha Samet P. Geraldo Klippel PT, MPH 08/31/23 4:11 PM

## 2023-09-01 ENCOUNTER — Encounter: Payer: Self-pay | Admitting: Medical-Surgical

## 2023-09-06 ENCOUNTER — Other Ambulatory Visit: Payer: Self-pay

## 2023-09-06 DIAGNOSIS — D7589 Other specified diseases of blood and blood-forming organs: Secondary | ICD-10-CM | POA: Diagnosis not present

## 2023-09-07 ENCOUNTER — Ambulatory Visit: Payer: Self-pay | Admitting: Medical-Surgical

## 2023-09-07 LAB — B12 AND FOLATE PANEL
Folate: 6.6 ng/mL (ref >3.0)
Vitamin B-12: 609 pg/mL (ref 232–1245)

## 2023-09-08 ENCOUNTER — Ambulatory Visit (INDEPENDENT_AMBULATORY_CARE_PROVIDER_SITE_OTHER): Admitting: Sports Medicine

## 2023-09-08 DIAGNOSIS — M5416 Radiculopathy, lumbar region: Secondary | ICD-10-CM | POA: Diagnosis not present

## 2023-09-08 NOTE — Progress Notes (Signed)
    Procedures performed today:    None.  Independent interpretation of notes and tests performed by another provider:   None.  Brief History, Exam, Impression, and Recommendations:    Right lumbar radiculitis This is an exquisitely pleasant 58 year old female, we initially saw her in the beginning of April with about a month of right-sided low back pain with radiation down the back of the right leg down to the outside of the right foot, axial component of the pain was worse with sitting, flexion, Valsalva. She had a strongly positive straight leg raise at the time. We added formal PT, prednisone , x-rays, x-rays did confirm DDD. She has improved considerably but still has some pain at right posterior thigh, axial pain has resolved. She is functional, pain continues to improve. I have suggested continued conservative treatment. I also give the options of neuropathic medications versus MRI/epidural, we will stay the course for now, and if pain becomes intolerable we will proceed with MRI and epidural, she is somewhat hesitant to try oral medications. I explained that be at a pill or an epidural we are still administering a medication just through a different route, but we will respect her requests.    ____________________________________________ Joselyn Nicely. Sandy Crumb, M.D., ABFM., CAQSM., AME. Primary Care and Sports Medicine [facility]  Adjunct Professor of [employer]  [employer]  Restaurant manager, fast food

## 2023-09-08 NOTE — Assessment & Plan Note (Signed)
 This is an exquisitely pleasant 58 year old female, we initially saw her in the beginning of April with about a month of right-sided low back pain with radiation down the back of the right leg down to the outside of the right foot, axial component of the pain was worse with sitting, flexion, Valsalva. She had a strongly positive straight leg raise at the time. We added formal PT, prednisone , x-rays, x-rays did confirm DDD. She has improved considerably but still has some pain at right posterior thigh, axial pain has resolved. She is functional, pain continues to improve. I have suggested continued conservative treatment. I also give the options of neuropathic medications versus MRI/epidural, we will stay the course for now, and if pain becomes intolerable we will proceed with MRI and epidural, she is somewhat hesitant to try oral medications. I explained that be at a pill or an epidural we are still administering a medication just through a different route, but we will respect her requests.

## 2023-09-14 ENCOUNTER — Encounter: Payer: Self-pay | Admitting: Medical-Surgical

## 2023-09-14 ENCOUNTER — Telehealth: Admitting: Physician Assistant

## 2023-09-14 DIAGNOSIS — R3989 Other symptoms and signs involving the genitourinary system: Secondary | ICD-10-CM | POA: Diagnosis not present

## 2023-09-14 MED ORDER — CEPHALEXIN 500 MG PO CAPS: 500.0000 mg | ORAL_CAPSULE | Freq: Two times a day (BID) | ORAL | 0 refills | Status: AC

## 2023-09-14 NOTE — Progress Notes (Signed)

## 2023-09-14 NOTE — Progress Notes (Signed)
 I have spent 5 minutes in review of e-visit questionnaire, review and updating patient chart, medical decision making and response to patient.   Piedad Climes, PA-C

## 2023-09-29 ENCOUNTER — Telehealth: Admitting: Physician Assistant

## 2023-09-29 DIAGNOSIS — S51819A Laceration without foreign body of unspecified forearm, initial encounter: Secondary | ICD-10-CM | POA: Diagnosis not present

## 2023-09-29 MED ORDER — AMOXICILLIN-POT CLAVULANATE 875-125 MG PO TABS: 1.0000 | ORAL_TABLET | Freq: Two times a day (BID) | ORAL | 0 refills | Status: AC

## 2023-09-29 NOTE — Progress Notes (Signed)
 E-Visit for Simple Cut/Laceration  We are sorry that you have had an injury. Here is how we plan to help!  Based on what you shared with me it looks like you have a simple laceration that does not need to be repaired with stitches or tissue glue.  and I have prescribed Augmentin  875-125mg  by mouth twice daily for two days. You can continue Bacitracin as well. Would recommend to cover with a non-adherent dressing and wrap with an elastic bandage to hold on to protect the area from reopening or being re-injured. Wash the wound twice daily and redress.   HOME CARE: Clean the cut or scrape - Wash it well with soap and water. * avoid using hydrogen peroxide which may cause tissue damage, or impede wound healing.  Stop the bleeding - If your cut or scrape is bleeding, press a clean cloth or bandage firmly on the area for 20 minutes. You can also help slow the bleeding by holding the cut above the level of your heart.   Put a thin layer of Bacitracin antibiotic ointment on the cut or scrape. (this can be purchased at any local pharmacy- ask your pharmacist if you need assistance)   Cover the cut or scrape with a bandage or gauze. Keep the bandage clean and dry. Change the bandage 1 to 2 times every day until your cut or scrape heals.   Watch for signs that your cut or scrape is infected (redness, drainage, pain, warmth, swelling or fever)  Over the next 48 hours your wound should start to improve with less pain, less swelling and less redness. If you should develop increasing pain, swelling, redness, fever, pus from the wound you should be seen immediately to make sure this is not becoming infected.   WOUND CARE: Please keep a layer of antibiotic ointment (bacitracin preferred) on this wound at least twice a day for the next seven days and keep a sterile dressing over top of it. You may gently clean the wound with warm soap and water between dressing changes.  We strongly recommend that you have a  medical provider reevaluate your wound within 2 to 3 days in person to make sure that it is healing appropriately.  Thank you for choosing an e-visit.  Your e-visit answers were reviewed by a board certified advanced clinical practitioner to complete your personal care plan. Depending upon the condition, your plan could have included both over the counter or prescription medications.  Please review your pharmacy choice. Make sure the pharmacy is open so you can pick up prescription now. If there is a problem, you may contact your provider through [employer] and have the prescription routed to another pharmacy.  Your safety is important to us . If you have drug allergies check your prescription carefully.   For the next 24 hours you can use MyChart to ask questions about today's visit, request a non-urgent call back, or ask for a work or school excuse. You will get an email in the next two days asking about your experience. I hope that your e-visit has been valuable and will speed your recovery.    I have spent 5 minutes in review of e-visit questionnaire, review and updating patient chart, medical decision making and response to patient.   Angelia Kelp, PA-C

## 2023-10-18 ENCOUNTER — Telehealth (INDEPENDENT_AMBULATORY_CARE_PROVIDER_SITE_OTHER): Admitting: Medical-Surgical

## 2023-10-18 ENCOUNTER — Encounter: Payer: Self-pay | Admitting: Medical-Surgical

## 2023-10-18 DIAGNOSIS — Z7141 Alcohol abuse counseling and surveillance of alcoholic: Secondary | ICD-10-CM | POA: Diagnosis not present

## 2023-10-18 DIAGNOSIS — N390 Urinary tract infection, site not specified: Secondary | ICD-10-CM | POA: Diagnosis not present

## 2023-10-18 MED ORDER — NITROFURANTOIN MONOHYD MACRO 100 MG PO CAPS: 100.0000 mg | ORAL_CAPSULE | Freq: Every day | ORAL | 0 refills | Status: AC | PRN

## 2023-10-18 NOTE — Progress Notes (Signed)
 Virtual Visit via Video Note  I connected with Kara Cooper on 10/18/23 at  8:10 AM EDT by a video enabled telemedicine application and verified that I am speaking with the correct person using two identifiers.   I discussed the limitations of evaluation and management by telemedicine and the availability of in person appointments. The patient expressed understanding and agreed to proceed.  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  This has been going on for the last couple of years and is very frustrating.  Admits that she knows a certain position tends to lead to the UTI.  Is interested in medication to prevent this.  History of alcohol consumption and she has been trying to reduce the quantity.  She is doing fairly well but unfortunately she still has significant cravings.  She prefers drinking wine and admits that the taste is what she finds pleasing.  She has done some research online to see if there are medications that can help reduce the cravings.  She is already on Wellbutrin  that she has consider naltrexone versus Topamax.   Past medical history, Surgical history, Family history not pertinant except as noted below, Social history, Allergies, and medications have been entered into the medical record, reviewed, and corrections made.   Review of Systems: See HPI for pertinent positives and negatives.   Objective:    General: Speaking clearly in complete sentences without any shortness of breath.  Alert and oriented x3.  Normal judgment. No apparent acute distress.  Impression and Recommendations:    1. Alcohol cessation counseling (Primary) Discussed various options to help with alcohol cessation.  She admits that she will never quit  completely but wants to reduce cravings.  Continue Wellbutrin .  Discussed pros and cons of Topamax versus naltrexone.  She will consider this and let me know if she wants to try something new.  2. Postcoital UTI Discussed options.  Starting nitrofurantoin  100 mg daily as needed to be taken after intercourse.  I discussed the assessment and treatment plan with the patient. The patient was provided an opportunity to ask questions and all were answered. The patient agreed with the plan and demonstrated an understanding of the instructions.   The patient was advised to call back or seek an in-person evaluation if the symptoms worsen or if the condition fails to improve as anticipated.  Return if symptoms worsen or fail to improve.  Zada FREDRIK Palin, DNP, APRN, FNP-BC [facility]

## 2023-11-07 ENCOUNTER — Encounter: Payer: Self-pay | Admitting: Medical-Surgical

## 2023-11-07 MED ORDER — NALTREXONE HCL 50 MG PO TABS: ORAL_TABLET | ORAL | 3 refills | Status: DC

## 2023-11-10 ENCOUNTER — Encounter

## 2023-11-14 NOTE — Procedures (Signed)
Result scanned to media

## 2023-12-19 ENCOUNTER — Encounter: Payer: Self-pay | Admitting: Sports Medicine

## 2024-03-12 DIAGNOSIS — Z85828 Personal history of other malignant neoplasm of skin: Secondary | ICD-10-CM | POA: Diagnosis not present

## 2024-03-12 DIAGNOSIS — C44529 Squamous cell carcinoma of skin of other part of trunk: Secondary | ICD-10-CM | POA: Diagnosis not present

## 2024-03-12 DIAGNOSIS — L720 Epidermal cyst: Secondary | ICD-10-CM | POA: Diagnosis not present

## 2024-03-12 DIAGNOSIS — L814 Other melanin hyperpigmentation: Secondary | ICD-10-CM | POA: Diagnosis not present

## 2024-03-27 DIAGNOSIS — C44529 Squamous cell carcinoma of skin of other part of trunk: Secondary | ICD-10-CM | POA: Diagnosis not present

## 2024-04-30 ENCOUNTER — Telehealth: Admitting: Physician Assistant

## 2024-04-30 DIAGNOSIS — B9789 Other viral agents as the cause of diseases classified elsewhere: Secondary | ICD-10-CM | POA: Diagnosis not present

## 2024-04-30 DIAGNOSIS — J019 Acute sinusitis, unspecified: Secondary | ICD-10-CM | POA: Diagnosis not present

## 2024-04-30 MED ORDER — IPRATROPIUM BROMIDE 0.03 % NA SOLN: 2.0000 | Freq: Two times a day (BID) | NASAL | 0 refills | Status: AC

## 2024-04-30 NOTE — Progress Notes (Signed)
 We are sorry that you are not feeling well.  Here is how we plan to help!  Based on what you have shared with me it looks like you have sinusitis.  Sinusitis is inflammation and infection in the sinus cavities of the head.  Based on your presentation I believe you most likely have Acute Viral Sinusitis.This is an infection most likely caused by a virus. There is not specific treatment for viral sinusitis other than to help you with the symptoms until the infection runs its course.  You may use an oral decongestant such as Mucinex D or if you have glaucoma or high blood pressure use plain Mucinex. Saline nasal spray help and can safely be used as often as needed for congestion, I have prescribed: Ipratropium Bromide nasal spray 0.03% 2 sprays in eah nostril 2-3 times a day  Some authorities believe that zinc sprays or the use of Echinacea may shorten the course of your symptoms.  Sinus infections are not as easily transmitted as other respiratory infection, however we still recommend that you avoid close contact with loved ones, especially the very young and elderly.  Remember to wash your hands thoroughly throughout the day as this is the number one way to prevent the spread of infection!  Home Care: Only take medications as instructed by your medical team. Do not take these medications with alcohol. A steam or ultrasonic humidifier can help congestion.  You can place a towel over your head and breathe in the steam from hot water coming from a faucet. Avoid close contacts especially the very young and the elderly. Cover your mouth when you cough or sneeze. Always remember to wash your hands.  Get Help Right Away If: You develop worsening fever or sinus pain. You develop a severe head ache or visual changes. Your symptoms persist after you have completed your treatment plan.  Make sure you Understand these instructions. Will watch your condition. Will get help right away if you are not doing  well or get worse.  Your e-visit answers were reviewed by a board certified advanced clinical practitioner to complete your personal care plan.  Depending on the condition, your plan could have included both over the counter or prescription medications.  If there is a problem please reply  once you have received a response from your provider.  Your safety is important to us .  If you have drug allergies check your prescription carefully.    You can use MyChart to ask questions about today's visit, request a non-urgent call back, or ask for a work or school excuse for 24 hours related to this e-Visit. If it has been greater than 24 hours you will need to follow up with your provider, or enter a new e-Visit to address those concerns.  You will get an e-mail in the next two days asking about your experience.  I hope that your e-visit has been valuable and will speed your recovery. Thank you for using e-visits.  I have spent 5 minutes in review of e-visit questionnaire, review and updating patient chart, medical decision making and response to patient.   Elsie Velma Lunger, PA-C

## 2024-05-09 ENCOUNTER — Other Ambulatory Visit: Payer: Self-pay | Admitting: Medical-Surgical
